# Patient Record
Sex: Female | Born: 1950 | Race: White | Hispanic: No | Smoking: Former smoker
Health system: Southern US, Community
[De-identification: ages and names within clinical notes are randomized; demographics above are authoritative.]

## PROBLEM LIST (undated history)

## (undated) ENCOUNTER — Inpatient Hospital Stay (AMBULATORY_SURGERY_CENTER): Payer: PPO | Admitting: Podiatry

## (undated) DIAGNOSIS — Z8619 Personal history of other infectious and parasitic diseases: Secondary | ICD-10-CM

## (undated) DIAGNOSIS — Z8489 Family history of other specified conditions: Secondary | ICD-10-CM

## (undated) DIAGNOSIS — T8859XA Other complications of anesthesia, initial encounter: Secondary | ICD-10-CM

## (undated) DIAGNOSIS — M353 Polymyalgia rheumatica: Secondary | ICD-10-CM

## (undated) DIAGNOSIS — M810 Age-related osteoporosis without current pathological fracture: Secondary | ICD-10-CM

## (undated) DIAGNOSIS — E1142 Type 2 diabetes mellitus with diabetic polyneuropathy: Secondary | ICD-10-CM

## (undated) DIAGNOSIS — E781 Pure hyperglyceridemia: Secondary | ICD-10-CM

## (undated) DIAGNOSIS — E040 Nontoxic diffuse goiter: Secondary | ICD-10-CM

## (undated) DIAGNOSIS — R3915 Urgency of urination: Secondary | ICD-10-CM

## (undated) DIAGNOSIS — M1990 Unspecified osteoarthritis, unspecified site: Secondary | ICD-10-CM

## (undated) DIAGNOSIS — F419 Anxiety disorder, unspecified: Secondary | ICD-10-CM

## (undated) DIAGNOSIS — Z8709 Personal history of other diseases of the respiratory system: Secondary | ICD-10-CM

## (undated) DIAGNOSIS — T4145XA Adverse effect of unspecified anesthetic, initial encounter: Secondary | ICD-10-CM

## (undated) DIAGNOSIS — F329 Major depressive disorder, single episode, unspecified: Secondary | ICD-10-CM

## (undated) DIAGNOSIS — D649 Anemia, unspecified: Secondary | ICD-10-CM

## (undated) DIAGNOSIS — E785 Hyperlipidemia, unspecified: Secondary | ICD-10-CM

## (undated) DIAGNOSIS — R112 Nausea with vomiting, unspecified: Secondary | ICD-10-CM

## (undated) DIAGNOSIS — M2550 Pain in unspecified joint: Secondary | ICD-10-CM

## (undated) DIAGNOSIS — F32A Depression, unspecified: Secondary | ICD-10-CM

## (undated) DIAGNOSIS — M797 Fibromyalgia: Secondary | ICD-10-CM

## (undated) DIAGNOSIS — E119 Type 2 diabetes mellitus without complications: Secondary | ICD-10-CM

## (undated) DIAGNOSIS — E041 Nontoxic single thyroid nodule: Secondary | ICD-10-CM

## (undated) DIAGNOSIS — F418 Other specified anxiety disorders: Secondary | ICD-10-CM

## (undated) DIAGNOSIS — Z9889 Other specified postprocedural states: Secondary | ICD-10-CM

## (undated) DIAGNOSIS — G629 Polyneuropathy, unspecified: Secondary | ICD-10-CM

## (undated) DIAGNOSIS — G4700 Insomnia, unspecified: Secondary | ICD-10-CM

## (undated) DIAGNOSIS — J40 Bronchitis, not specified as acute or chronic: Secondary | ICD-10-CM

## (undated) DIAGNOSIS — J189 Pneumonia, unspecified organism: Secondary | ICD-10-CM

## (undated) HISTORY — PX: ESOPHAGOGASTRODUODENOSCOPY: SHX1529

## (undated) HISTORY — PX: JOINT REPLACEMENT: SHX530

## (undated) HISTORY — PX: CHOLECYSTECTOMY: SHX55

## (undated) HISTORY — PX: HIP ARTHROPLASTY: SHX981

## (undated) HISTORY — DX: Anemia, unspecified: D64.9

## (undated) HISTORY — DX: Pure hyperglyceridemia: E78.1

## (undated) HISTORY — DX: Anxiety disorder, unspecified: F41.9

## (undated) HISTORY — DX: Age-related osteoporosis without current pathological fracture: M81.0

## (undated) HISTORY — PX: TUMOR REMOVAL: SHX12

## (undated) HISTORY — DX: Polymyalgia rheumatica: M35.3

## (undated) HISTORY — PX: CERVICAL FUSION: SHX112

## (undated) HISTORY — DX: Nontoxic diffuse goiter: E04.0

## (undated) HISTORY — DX: Other specified anxiety disorders: F41.8

## (undated) HISTORY — PX: TUBAL LIGATION: SHX77

## (undated) HISTORY — PX: CARPAL TUNNEL RELEASE: SHX101

## (undated) HISTORY — PX: COLONOSCOPY: SHX174

## (undated) HISTORY — DX: Type 2 diabetes mellitus with diabetic polyneuropathy: E11.42

## (undated) HISTORY — PX: ULNAR NERVE REPAIR: SHX2594

## (undated) HISTORY — PX: BACK SURGERY: SHX140

---

## 1983-07-10 HISTORY — PX: CARPAL TUNNEL RELEASE: SHX101

## 1985-07-09 HISTORY — PX: TUBAL LIGATION: SHX77

## 1987-07-10 HISTORY — PX: BACK SURGERY: SHX140

## 1994-07-09 HISTORY — PX: OTHER SURGICAL HISTORY: SHX169

## 2000-01-03 ENCOUNTER — Other Ambulatory Visit: Admission: RE | Admit: 2000-01-03 | Discharge: 2000-01-03 | Payer: Self-pay | Admitting: Obstetrics & Gynecology

## 2000-01-11 ENCOUNTER — Other Ambulatory Visit: Admission: RE | Admit: 2000-01-11 | Discharge: 2000-01-11 | Payer: Self-pay | Admitting: *Deleted

## 2000-01-11 ENCOUNTER — Encounter (INDEPENDENT_AMBULATORY_CARE_PROVIDER_SITE_OTHER): Payer: Self-pay

## 2001-02-17 ENCOUNTER — Other Ambulatory Visit: Admission: RE | Admit: 2001-02-17 | Discharge: 2001-02-17 | Payer: Self-pay | Admitting: Obstetrics and Gynecology

## 2002-04-24 ENCOUNTER — Other Ambulatory Visit: Admission: RE | Admit: 2002-04-24 | Discharge: 2002-04-24 | Payer: Self-pay | Admitting: Obstetrics and Gynecology

## 2003-04-30 ENCOUNTER — Other Ambulatory Visit: Admission: RE | Admit: 2003-04-30 | Discharge: 2003-04-30 | Payer: Self-pay | Admitting: Obstetrics and Gynecology

## 2005-07-09 HISTORY — PX: CHOLECYSTECTOMY: SHX55

## 2005-07-09 HISTORY — PX: EYE SURGERY: SHX253

## 2006-07-09 HISTORY — PX: REFRACTIVE SURGERY: SHX103

## 2006-12-23 ENCOUNTER — Encounter (HOSPITAL_COMMUNITY): Admission: RE | Admit: 2006-12-23 | Discharge: 2007-01-03 | Payer: Self-pay | Admitting: Endocrinology

## 2007-01-03 ENCOUNTER — Inpatient Hospital Stay (HOSPITAL_COMMUNITY): Admission: RE | Admit: 2007-01-03 | Discharge: 2007-01-05 | Payer: Self-pay | Admitting: Neurosurgery

## 2007-07-10 HISTORY — PX: CERVICAL FUSION: SHX112

## 2009-07-09 HISTORY — PX: OTHER SURGICAL HISTORY: SHX169

## 2010-11-21 NOTE — Op Note (Signed)
Madison, Stein                ACCOUNT NO.:  [id number]   MEDICAL RECORD NO.:  [id number]          PATIENT TYPE:  INP   LOCATION:  2899                         FACILITY:  [facility]   PHYSICIAN:  Danae Orleans. Venetia Maxon, M.D.  DATE OF BIRTH:  19-Nov-1950   DATE OF PROCEDURE:  01/03/2007  DATE OF DISCHARGE:                               OPERATIVE REPORT   PREOPERATIVE DIAGNOSIS:  Herniated cervical disc with stenosis,  spondylosis, degenerative disc disease, radiculopathy with morbid  obesity, C4-C5, C5-C6, and C6-C7.   POSTOPERATIVE DIAGNOSIS:  Herniated cervical disc with stenosis,  spondylosis, degenerative disc disease, radiculopathy with morbid  obesity, C4-C5, C5-C6, and C6-C7.   PROCEDURE:  Anterior cervical decompression and fusion C4-C5, C5-C6, and  C6-C7, with PEEK interbody cages, morcellized autograft bone and  Osteocel, and anterior cervical plate.   SURGEON:  Danae Orleans. Venetia Maxon, M.D.   ASSISTANT:  Cristi Loron, M.D.  Georgiann Cocker, RN   ANESTHESIA:  General endotracheal anesthesia.   ESTIMATED BLOOD LOSS:  450 mL.   COMPLICATIONS:  None.   DISPOSITION:  To recovery.   INDICATIONS FOR PROCEDURE:  Madison Stein is a 60 year old woman with  severe cervical spondylosis and foraminal stenosis, left greater than  right, at C5-C6 and C6-C7 levels with a large disc herniation at C4-C5,  and nerve root compression.  It was elected to take her to surgery for  anterior cervical decompression and fusion.   DESCRIPTION OF PROCEDURE:  Madison Stein was brought to the operating room.  Following satisfactory and uncomplicated induction of general  endotracheal anesthesia and placement of intravenous lines, the patient  was placed in the supine position on the operating table.  Her neck was  placed in slight extension.  She was placed in 10 pounds of halter  traction.  Her anterior neck was then prepped and draped in the usual  sterile fashion.  Shoulders were wrapped due to the  patient's large body  habitus for later visualization with x-ray.  Subsequently, after  infiltrating the skin and subcutaneous tissues with 0.25% Marcaine, 0.5%  lidocaine, and 1:200,000 epinephrine, an incision was made from midline  to the anterior border of the sternocleidomastoid muscle and carried  sharply through the platysmal layer.  Subplatysmal dissection was  performed exposing the anterior border of the sternocleidomastoid  muscle. Using blunt dissection, the carotid sheath kept lateral and  trachea and esophagus kept medial, exposing the anterior cervical spine.  A bent spinal needle was placed at what was felt to be the C4-C5 level  and this was confirmed on intraoperative x-ray.   Subsequently, longus colli muscles were taken down from the anterior  cervical spine from C4 through C7 using electrocautery and Key elevator.  Large ventral osteophytes were removed. Initially the C5-C6 and C6-C7  levels were operated and the self-retaining retractor was placed along  with up and down retractor.  The interspaces at C5-C6 and C6-C7 were  highly degenerated and osteophytes were removed.  The interspaces were  incised and disc material was removed in a piecemeal fashion. Initially  at C6-C7 level, distraction pins were placed  at C6 and C7.  The disc  space was highly degenerated.  Endplates were drilled down with a high  speed drill and the bone removed was saved for later use as bone  grafting.  Eventually, the large uncinate spurs were drilled down and  highly degenerated ligamentous tissue was removed with resultant  decompression of the thecal sac and both C7 nerve roots, in particularly  the left C7 nerve root had an extremely vertical take off and this was  decompressed.  Hemostasis was assured with Gelfoam soaked thrombin.  After trial sizing, a 6 mm medium PEEK interbody cage was selected,  packed with morselized bone autograft and Osteocel, inserted in the  interspace, and  countersunk appropriately.   Attention was then turned to the C5-C6 level and distraction pins were  moved at C5 and C6 and, at this level, there appeared again a very  degenerated disc level and the endplates were decorticated, uncinate  spurs were drilled down.  On the left side, there was extremely severe  degenerative changes with a large osteophyte and eventually, upon  removing this osteophyte, the C6 nerve root, which also had an extremely  vertical take off, was decompressed.  Hemostasis was again assured with  Gelfoam soaked in thrombin.  After trial sizing, a 6 mm PEEK interbody  cage was selected, packed with morselized autograft and Osteocel,  inserted in the interspace, and countersunk appropriately.   Attention was then turned to the C4-C5 level which was not nearly as  degenerated.  A fair amount of soft disc material was removed.  The  endplates were again decorticated with the high speed drill.  There was  a large amount of central and leftward herniated disc material causing  significant cord compression, particularly on the left lateral aspect of  the spinal cord and C5 nerve root. Both C5 nerve roots were decompressed  as they extended out the neural foramina.  Hemostasis was again assured.  The central spinal cord dura appeared to be well decompressed and  similarly sized medium PEEK interbody cage was selected, packed  morselized bone autograft and Osteocel, inserted in the interspace, and  countersunk appropriately.   The 49 mm Trestle anterior cervical plate was then affixed to the  anterior cervical spine using variable angle 14 mm screws.  The traction  weight was removed prior to placement of the plate.  14 mm variable  angle screws were placed at C4, C5, C6 and C7. All screws had excellent  purchase and the locking mechanisms were engaged.  Final x-ray  demonstrated the superior aspect of the plate at the C4 level but no  additional visualization despite  optimized x-ray technique.  The wound  was then copiously irrigated with bacitracin saline.  Soft tissues were  inspected and found to be in good repair.  A #7 JP Blake drain was then  inserted through a separate stab incision and anchored with an 0 nylon  stitch.  The platysma layer was closed with 3-0 Vicryl sutures and skin  edges were approximated with 3-0 Vicryl interrupted inverted sutures.  The wound was dressed with Benzoin, Steri-Strips, Telfa gauze and tape.  The patient was extubated in the operating room and returned to the  recovery room in stable satisfactory condition, having tolerated the  operation well.  All counts were correct at the end of the case.      Danae Orleans. Venetia Maxon, M.D.  Electronically Signed     JDS/MEDQ  D:  01/03/2007  T:  01/04/2007  Job:  [id number]

## 2011-04-25 LAB — CBC
HCT: 41.3
MCV: 81.4
Platelets: 307
RDW: 13.9

## 2011-07-10 DIAGNOSIS — J40 Bronchitis, not specified as acute or chronic: Secondary | ICD-10-CM

## 2011-07-10 HISTORY — DX: Bronchitis, not specified as acute or chronic: J40

## 2013-11-09 ENCOUNTER — Other Ambulatory Visit: Payer: Self-pay | Admitting: Orthopedic Surgery

## 2013-11-12 ENCOUNTER — Encounter (HOSPITAL_COMMUNITY): Payer: Self-pay | Admitting: Pharmacy Technician

## 2013-11-14 NOTE — Pre-Procedure Instructions (Signed)
[facility] - Preparing for Surgery  Before surgery, you can play an important role.  Because skin is not sterile, your skin needs to be as free of germs as possible.  You can reduce the number of germs on you skin by washing with CHG (chlorahexidine gluconate) soap before surgery.  CHG is an antiseptic cleaner which kills germs and bonds with the skin to continue killing germs even after washing.  Please DO NOT use if you have an allergy to CHG or antibacterial soaps.  If your skin becomes reddened/irritated stop using the CHG and inform your nurse when you arrive at Short Stay.  Do not shave (including legs and underarms) for at least 48 hours prior to the first CHG shower.  You may shave your face.  Please follow these instructions carefully:   1.  Shower with CHG Soap the night before surgery and the morning of Surgery.  2.  If you choose to wash your hair, wash your hair first as usual with your normal shampoo.  3.  After you shampoo, rinse your hair and body thoroughly to remove the shampoo.  4.  Use CHG as you would any other liquid soap.  You can apply CHG directly to the skin and wash gently with scrungie or a clean washcloth.  5.  Apply the CHG Soap to your body ONLY FROM THE NECK DOWN.  Do not use on open wounds or open sores.  Avoid contact with your eyes, ears, mouth and genitals (private parts).  Wash genitals (private parts) with your normal soap.  6.  Wash thoroughly, paying special attention to the area where your surgery will be performed.  7.  Thoroughly rinse your body with warm water from the neck down.  8.  DO NOT shower/wash with your normal soap after using and rinsing off the CHG Soap.  9.  Pat yourself dry with a clean towel.            10.  Wear clean pajamas.            11.  Place clean sheets on your bed the night of your first shower and do not sleep with pets.  Day of Surgery  Do not apply any lotions the morning of surgery.  Please wear clean clothes to the  hospital/surgery center.   

## 2013-11-14 NOTE — Pre-Procedure Instructions (Signed)
Madison Stein  11/14/2013   Your procedure is scheduled on:  May 15  Report to [facility] Admitting at 10:50 AM.  Call this number if you have problems the morning of surgery: [phone]   Remember:   Do not eat food or drink liquids after midnight.   Take these medicines the morning of surgery with A SIP OF WATER: Cymbalta   STOP Arthrotec and multiple vitamins today   STOP/ Do not take Aspirin, Aleve, Naproxen, Advil, Ibuprofen, Vitamin, Herbs, or Supplements starting today   Do not wear jewelry, make-up or nail polish.  Do not wear lotions, powders, or perfumes. You may wear deodorant.  Do not shave 48 hours prior to surgery. Men may shave face and neck.  Do not bring valuables to the hospital.  [facility] is not responsible for any belongings or valuables.               Contacts, dentures or bridgework may not be worn into surgery.  Leave suitcase in the car. After surgery it may be brought to your room.  For patients admitted to the hospital, discharge time is determined by your treatment team.               Special Instructions: See [facility] Preparing For Surgery   Please read over the following fact sheets that you were given: Pain Booklet, Coughing and Deep Breathing, Blood Transfusion Information, Total Joint Packet and Surgical Site Infection Prevention

## 2013-11-16 ENCOUNTER — Encounter (HOSPITAL_COMMUNITY)
Admission: RE | Admit: 2013-11-16 | Discharge: 2013-11-16 | Disposition: A | Discharge status: Home / Self Care | Payer: Managed Care, Other (non HMO) | Source: Ambulatory Visit | Attending: Orthopedic Surgery | Admitting: Orthopedic Surgery

## 2013-11-16 ENCOUNTER — Encounter (HOSPITAL_COMMUNITY): Payer: Self-pay

## 2013-11-16 DIAGNOSIS — Z01810 Encounter for preprocedural cardiovascular examination: Secondary | ICD-10-CM | POA: Insufficient documentation

## 2013-11-16 DIAGNOSIS — Z01818 Encounter for other preprocedural examination: Secondary | ICD-10-CM | POA: Insufficient documentation

## 2013-11-16 DIAGNOSIS — Z01812 Encounter for preprocedural laboratory examination: Secondary | ICD-10-CM | POA: Insufficient documentation

## 2013-11-16 DIAGNOSIS — E119 Type 2 diabetes mellitus without complications: Secondary | ICD-10-CM | POA: Insufficient documentation

## 2013-11-16 HISTORY — DX: Pain in unspecified joint: M25.50

## 2013-11-16 HISTORY — DX: Personal history of other diseases of the respiratory system: Z87.09

## 2013-11-16 HISTORY — DX: Type 2 diabetes mellitus without complications: E11.9

## 2013-11-16 HISTORY — DX: Personal history of other infectious and parasitic diseases: Z86.19

## 2013-11-16 HISTORY — DX: Hyperlipidemia, unspecified: E78.5

## 2013-11-16 HISTORY — DX: Unspecified osteoarthritis, unspecified site: M19.90

## 2013-11-16 HISTORY — DX: Pneumonia, unspecified organism: J18.9

## 2013-11-16 HISTORY — DX: Insomnia, unspecified: G47.00

## 2013-11-16 HISTORY — DX: Family history of other specified conditions: Z84.89

## 2013-11-16 HISTORY — DX: Fibromyalgia: M79.7

## 2013-11-16 HISTORY — DX: Nontoxic single thyroid nodule: E04.1

## 2013-11-16 HISTORY — DX: Major depressive disorder, single episode, unspecified: F32.9

## 2013-11-16 HISTORY — DX: Depression, unspecified: F32.A

## 2013-11-16 LAB — URINALYSIS, ROUTINE W REFLEX MICROSCOPIC
Glucose, UA: NEGATIVE mg/dL
HGB URINE DIPSTICK: NEGATIVE
KETONES UR: 15 mg/dL — AB
Nitrite: POSITIVE — AB
PH: 5 (ref 5.0–8.0)
Protein, ur: NEGATIVE mg/dL
Specific Gravity, Urine: 1.028 (ref 1.005–1.030)
Urobilinogen, UA: 1 mg/dL (ref 0.0–1.0)

## 2013-11-16 LAB — BASIC METABOLIC PANEL
BUN: 21 mg/dL (ref 6–23)
CALCIUM: 9.4 mg/dL (ref 8.4–10.5)
CO2: 22 meq/L (ref 19–32)
CREATININE: 0.83 mg/dL (ref 0.50–1.10)
Chloride: 102 mEq/L (ref 96–112)
GFR calc Af Amer: 87 mL/min — ABNORMAL LOW (ref >90)
GFR, EST NON AFRICAN AMERICAN: 75 mL/min — AB (ref >90)
Glucose, Bld: 195 mg/dL — ABNORMAL HIGH (ref 70–99)
Potassium: 4.8 mEq/L (ref 3.7–5.3)
SODIUM: 141 meq/L (ref 137–147)

## 2013-11-16 LAB — URINE MICROSCOPIC-ADD ON

## 2013-11-16 LAB — PROTIME-INR
INR: 0.9 (ref 0.00–1.49)
PROTHROMBIN TIME: 12 s (ref 11.6–15.2)

## 2013-11-16 LAB — TYPE AND SCREEN
ABO/RH(D): O POS
ANTIBODY SCREEN: NEGATIVE

## 2013-11-16 LAB — CBC WITH DIFFERENTIAL/PLATELET
BASOS ABS: 0.1 10*3/uL (ref 0.0–0.1)
Basophils Relative: 1 % (ref 0–1)
EOS PCT: 4 % (ref 0–5)
Eosinophils Absolute: 0.5 10*3/uL (ref 0.0–0.7)
HCT: 41.7 % (ref 36.0–46.0)
Hemoglobin: 13.4 g/dL (ref 12.0–15.0)
LYMPHS PCT: 32 % (ref 12–46)
Lymphs Abs: 3.3 10*3/uL (ref 0.7–4.0)
MCH: 27.3 pg (ref 26.0–34.0)
MCHC: 32.1 g/dL (ref 30.0–36.0)
MCV: 84.9 fL (ref 78.0–100.0)
Monocytes Absolute: 0.6 10*3/uL (ref 0.1–1.0)
Monocytes Relative: 6 % (ref 3–12)
NEUTROS PCT: 57 % (ref 43–77)
Neutro Abs: 6 10*3/uL (ref 1.7–7.7)
PLATELETS: 350 10*3/uL (ref 150–400)
RBC: 4.91 MIL/uL (ref 3.87–5.11)
RDW: 13.4 % (ref 11.5–15.5)
WBC: 10.5 10*3/uL (ref 4.0–10.5)

## 2013-11-16 LAB — SURGICAL PCR SCREEN
MRSA, PCR: NEGATIVE
Staphylococcus aureus: POSITIVE — AB

## 2013-11-16 LAB — APTT: aPTT: 31 seconds (ref 24–37)

## 2013-11-16 LAB — ABO/RH: ABO/RH(D): O POS

## 2013-11-16 MED ORDER — CHLORHEXIDINE GLUCONATE 4 % EX LIQD: 60.0000 mL | Freq: Once | CUTANEOUS | Status: DC

## 2013-11-16 NOTE — Progress Notes (Signed)
Prescription called into [facility] per patient request

## 2013-11-16 NOTE — Pre-Procedure Instructions (Signed)
Madison Stein  11/16/2013   Your procedure is scheduled on:  Fri, May 15 @ 12:50 PM  Report to [facility]  at 10:45 AM.  Call this number if you have problems the morning of surgery: [phone]   Remember:   Do not eat food or drink liquids after midnight.   Take these medicines the morning of surgery with A SIP OF WATER: Cymbalta(Duloxetine)               Stop taking your Diclofenac. No Goody's,BC's,Aleve,Aspirin,Ibuprofen,Fish Oil,or any Herbal Medicaitons   Do not wear jewelry, make-up or nail polish.  Do not wear lotions, powders, or perfumes. You may wear deodorant.  Do not shave 48 hours prior to surgery.   Do not bring valuables to the hospital.  [facility] is not responsible                  for any belongings or valuables.               Contacts, dentures or bridgework may not be worn into surgery.  Leave suitcase in the car. After surgery it may be brought to your room.  For patients admitted to the hospital, discharge time is determined by your                treatment team.                Special Instructions:  [facility] - Preparing for Surgery  Before surgery, you can play an important role.  Because skin is not sterile, your skin needs to be as free of germs as possible.  You can reduce the number of germs on you skin by washing with CHG (chlorahexidine gluconate) soap before surgery.  CHG is an antiseptic cleaner which kills germs and bonds with the skin to continue killing germs even after washing.  Please DO NOT use if you have an allergy to CHG or antibacterial soaps.  If your skin becomes reddened/irritated stop using the CHG and inform your nurse when you arrive at Short Stay.  Do not shave (including legs and underarms) for at least 48 hours prior to the first CHG shower.  You may shave your face.  Please follow these instructions carefully:   1.  Shower with CHG Soap the night before surgery and the                                morning of  Surgery.  2.  If you choose to wash your hair, wash your hair first as usual with your       normal shampoo.  3.  After you shampoo, rinse your hair and body thoroughly to remove the                      Shampoo.  4.  Use CHG as you would any other liquid soap.  You can apply chg directly       to the skin and wash gently with scrungie or a clean washcloth.  5.  Apply the CHG Soap to your body ONLY FROM THE NECK DOWN.        Do not use on open wounds or open sores.  Avoid contact with your eyes,       ears, mouth and genitals (private parts).  Wash genitals (private parts)       with your normal  soap.  6.  Wash thoroughly, paying special attention to the area where your surgery        will be performed.  7.  Thoroughly rinse your body with warm water from the neck down.  8.  DO NOT shower/wash with your normal soap after using and rinsing off       the CHG Soap.  9.  Pat yourself dry with a clean towel.            10.  Wear clean pajamas.            11.  Place clean sheets on your bed the night of your first shower and do not        sleep with pets.  Day of Surgery  Do not apply any lotions/deoderants the morning of surgery.  Please wear clean clothes to the hospital/surgery center.     Please read over the following fact sheets that you were given: Pain Booklet, Coughing and Deep Breathing, Blood Transfusion Information, MRSA Information and Surgical Site Infection Prevention

## 2013-11-16 NOTE — Progress Notes (Signed)
Pt doesn't have a cardiologist  Denies ever having an echo/stress test/heart cath  Denies EKG or CXR in past yr  Medical Md is [place] in [place]

## 2013-11-19 MED ORDER — CEFAZOLIN SODIUM-DEXTROSE 2-3 GM-% IV SOLR
2.0000 g | INTRAVENOUS | Status: AC
  Administered 2013-11-20: 2 g via INTRAVENOUS
  Filled 2013-11-19: qty 50

## 2013-11-19 NOTE — H&P (Signed)
TOTAL HIP ADMISSION H&P  Patient is admitted for right total hip arthroplasty.  Subjective:  Chief Complaint: right hip pain  HPI: Madison Stein, 63 y.o. female, has a history of pain and functional disability in the right hip(s) due to arthritis and patient has failed non-surgical conservative treatments for greater than 12 weeks to include NSAID's and/or analgesics, corticosteriod injections, weight reduction as appropriate and activity modification.  Onset of symptoms was gradual starting several years ago with gradually worsening course since that time.The patient noted no past surgery on the right hip(s).  Patient currently rates pain in the right hip at 10 out of 10 with activity. Patient has night pain, worsening of pain with activity and weight bearing, pain that interfers with activities of daily living and pain with passive range of motion. Patient has evidence of joint space narrowing by imaging studies. This condition presents safety issues increasing the risk of falls.  There is no current active infection.  There are no active problems to display for this patient.  Past Medical History  Diagnosis Date  . Diabetes mellitus without complication     takes Metformin daily  . Depression     takes Cymbalta daily  . Family history of anesthesia complication     mom and sister get very sick  . Hyperlipidemia     not on meds right now;watching what she eats  . Pneumonia 35yrs ago    hx of  . History of bronchitis 43yrs ago  . Fibromyalgia   . Arthritis   . Joint pain   . Thyroid nodule   . Insomnia     has Ambien if needed  . History of shingles     Past Surgical History  Procedure Laterality Date  . Carpal tunnel release Right 1985  . Back surgery  1989  . Ulnar nerve release Left 1996  . Cholecystectomy  2007  . Refractive surgery  2008  . Cervical fusion  2009  . Colonoscopy    . Esophagogastroduodenoscopy    . 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 Morphine And Related Nausea And Vomiting    History  Substance Use Topics  . Smoking status: Former Research scientist (life sciences)  . Smokeless tobacco: Not on file     Comment: quit smoking in 1997  . Alcohol Use: Yes     Comment: rare beer    No family history on file.   Review of Systems  Constitutional: Negative.   HENT: Negative.   Eyes: Negative.   Respiratory: Negative.   Cardiovascular: Negative.   Gastrointestinal: Negative.   Genitourinary: Negative.   Musculoskeletal: Positive for joint pain and myalgias.  Skin: Negative.   Neurological: Negative.   Endo/Heme/Allergies: Negative.   Psychiatric/Behavioral: Negative.     Objective:  Physical Exam  Constitutional: She is oriented to person, place, and time. She appears well-developed and well-nourished.  HENT:  Head: Normocephalic and atraumatic.  Eyes: Pupils are equal, round, and reactive to light.  Neck: Normal range of motion. Neck supple.  Cardiovascular: Intact distal pulses.   Respiratory: Effort normal.  Musculoskeletal: She exhibits tenderness.   Internal rotation causes severe pain. She walks with a Trendelenburg gait on the right side, secondary to pain and muscle weakness.    Neurological: She is alert and oriented to person, place, and time.  Skin: Skin is warm and dry.  Psychiatric: She has a normal mood  and affect. Her behavior is normal. Judgment and thought content normal.    Vital signs in last 24 hours:    Labs:   There is no height or weight on file to calculate BMI.   Imaging Review Radiographs:  X-rays were ordered, performed, and interpreted by me today included; AP pelvis, crosstable lateral of the right hip show loss of one to 2 mm of cartilage superiorly.  She has a high neck shaft angle of about 140, consistent with mild developmental hip dysplasia.  This is not present on the left hip.  Assessment/Plan:  End stage arthritis,  right hip(s)  The patient history, physical examination, clinical judgement of the provider and imaging studies are consistent with end stage degenerative joint disease of the right hip(s) and total hip arthroplasty is deemed medically necessary. The treatment options including medical management, injection therapy, arthroscopy and arthroplasty were discussed at length. The risks and benefits of total hip arthroplasty were presented and reviewed. The risks due to aseptic loosening, infection, stiffness, dislocation/subluxation,  thromboembolic complications and other imponderables were discussed.  The patient acknowledged the explanation, agreed to proceed with the plan and consent was signed. Patient is being admitted for inpatient treatment for surgery, pain control, PT, OT, prophylactic antibiotics, VTE prophylaxis, progressive ambulation and ADL's and discharge planning.The patient is planning to be discharged to skilled nursing facility

## 2013-11-20 ENCOUNTER — Inpatient Hospital Stay (HOSPITAL_COMMUNITY): Payer: Managed Care, Other (non HMO)

## 2013-11-20 ENCOUNTER — Encounter (HOSPITAL_COMMUNITY): Payer: Self-pay

## 2013-11-20 ENCOUNTER — Encounter (HOSPITAL_COMMUNITY)
Admission: RE | Disposition: A | Discharge status: Home Health | Payer: Self-pay | Source: Ambulatory Visit | Attending: Orthopedic Surgery

## 2013-11-20 ENCOUNTER — Inpatient Hospital Stay (HOSPITAL_COMMUNITY)
Admission: RE | Admit: 2013-11-20 | Discharge: 2013-11-22 | DRG: 470 | Disposition: A | Discharge status: Home Health | Payer: Managed Care, Other (non HMO) | Source: Ambulatory Visit | Attending: Orthopedic Surgery | Admitting: Orthopedic Surgery

## 2013-11-20 ENCOUNTER — Encounter (HOSPITAL_COMMUNITY): Payer: Managed Care, Other (non HMO) | Admitting: Anesthesiology

## 2013-11-20 ENCOUNTER — Inpatient Hospital Stay (HOSPITAL_COMMUNITY): Payer: Managed Care, Other (non HMO) | Admitting: Anesthesiology

## 2013-11-20 DIAGNOSIS — F3289 Other specified depressive episodes: Secondary | ICD-10-CM | POA: Diagnosis present

## 2013-11-20 DIAGNOSIS — Z885 Allergy status to narcotic agent status: Secondary | ICD-10-CM

## 2013-11-20 DIAGNOSIS — Z87891 Personal history of nicotine dependence: Secondary | ICD-10-CM

## 2013-11-20 DIAGNOSIS — Z7982 Long term (current) use of aspirin: Secondary | ICD-10-CM

## 2013-11-20 DIAGNOSIS — M1610 Unilateral primary osteoarthritis, unspecified hip: Principal | ICD-10-CM | POA: Diagnosis present

## 2013-11-20 DIAGNOSIS — F329 Major depressive disorder, single episode, unspecified: Secondary | ICD-10-CM | POA: Diagnosis present

## 2013-11-20 DIAGNOSIS — E785 Hyperlipidemia, unspecified: Secondary | ICD-10-CM | POA: Diagnosis present

## 2013-11-20 DIAGNOSIS — E119 Type 2 diabetes mellitus without complications: Secondary | ICD-10-CM | POA: Diagnosis present

## 2013-11-20 DIAGNOSIS — Z8619 Personal history of other infectious and parasitic diseases: Secondary | ICD-10-CM

## 2013-11-20 DIAGNOSIS — M169 Osteoarthritis of hip, unspecified: Principal | ICD-10-CM | POA: Diagnosis present

## 2013-11-20 DIAGNOSIS — Z9089 Acquired absence of other organs: Secondary | ICD-10-CM

## 2013-11-20 DIAGNOSIS — G4700 Insomnia, unspecified: Secondary | ICD-10-CM | POA: Diagnosis present

## 2013-11-20 DIAGNOSIS — Z79899 Other long term (current) drug therapy: Secondary | ICD-10-CM

## 2013-11-20 DIAGNOSIS — IMO0001 Reserved for inherently not codable concepts without codable children: Secondary | ICD-10-CM | POA: Diagnosis present

## 2013-11-20 DIAGNOSIS — Z981 Arthrodesis status: Secondary | ICD-10-CM

## 2013-11-20 DIAGNOSIS — M1611 Unilateral primary osteoarthritis, right hip: Secondary | ICD-10-CM | POA: Diagnosis present

## 2013-11-20 HISTORY — DX: Other specified postprocedural states: R11.2

## 2013-11-20 HISTORY — DX: Other specified postprocedural states: Z98.890

## 2013-11-20 HISTORY — DX: Other complications of anesthesia, initial encounter: T88.59XA

## 2013-11-20 HISTORY — PX: TOTAL HIP ARTHROPLASTY: SHX124

## 2013-11-20 HISTORY — DX: Adverse effect of unspecified anesthetic, initial encounter: T41.45XA

## 2013-11-20 LAB — GLUCOSE, CAPILLARY
GLUCOSE-CAPILLARY: 162 mg/dL — AB (ref 70–99)
Glucose-Capillary: 158 mg/dL — ABNORMAL HIGH (ref 70–99)
Glucose-Capillary: 138 mg/dL — ABNORMAL HIGH (ref 70–99)
Glucose-Capillary: 223 mg/dL — ABNORMAL HIGH (ref 70–99)

## 2013-11-20 SURGERY — ARTHROPLASTY, HIP, TOTAL,POSTERIOR APPROACH
Anesthesia: General | Site: Hip | Laterality: Right

## 2013-11-20 MED ORDER — LACTATED RINGERS IV SOLN
INTRAVENOUS | Status: DC
  Administered 2013-11-20 (×2): via INTRAVENOUS

## 2013-11-20 MED ORDER — TRANEXAMIC ACID 100 MG/ML IV SOLN
1000.0000 mg | INTRAVENOUS | Status: AC
  Administered 2013-11-20: 1000 mg via INTRAVENOUS
  Filled 2013-11-20: qty 10

## 2013-11-20 MED ORDER — EPHEDRINE SULFATE 50 MG/ML IJ SOLN
INTRAMUSCULAR | Status: DC | PRN
  Administered 2013-11-20 (×2): 10 mg via INTRAVENOUS

## 2013-11-20 MED ORDER — PHENYLEPHRINE 40 MCG/ML (10ML) SYRINGE FOR IV PUSH (FOR BLOOD PRESSURE SUPPORT)
PREFILLED_SYRINGE | INTRAVENOUS | Status: AC
  Filled 2013-11-20: qty 10

## 2013-11-20 MED ORDER — PHENYLEPHRINE HCL 10 MG/ML IJ SOLN
INTRAMUSCULAR | Status: DC | PRN
  Administered 2013-11-20: 40 ug via INTRAVENOUS
  Administered 2013-11-20: 80 ug via INTRAVENOUS
  Administered 2013-11-20: 40 ug via INTRAVENOUS

## 2013-11-20 MED ORDER — ALUMINUM HYDROXIDE GEL 320 MG/5ML PO SUSP
15.0000 mL | ORAL | Status: DC | PRN
  Filled 2013-11-20: qty 30

## 2013-11-20 MED ORDER — METFORMIN HCL 500 MG PO TABS
1000.0000 mg | ORAL_TABLET | Freq: Two times a day (BID) | ORAL | Status: DC
  Administered 2013-11-21 – 2013-11-22 (×3): 1000 mg via ORAL
  Filled 2013-11-20 (×5): qty 2

## 2013-11-20 MED ORDER — SODIUM CHLORIDE 0.9 % IR SOLN
Status: DC | PRN
  Administered 2013-11-20: 1000 mL

## 2013-11-20 MED ORDER — ONDANSETRON HCL 4 MG/2ML IJ SOLN: 4.0000 mg | Freq: Once | INTRAMUSCULAR | Status: DC | PRN

## 2013-11-20 MED ORDER — OXYCODONE HCL 5 MG PO TABS
5.0000 mg | ORAL_TABLET | Freq: Once | ORAL | Status: AC | PRN
  Administered 2013-11-20: 5 mg via ORAL

## 2013-11-20 MED ORDER — ASPIRIN EC 325 MG PO TBEC
325.0000 mg | DELAYED_RELEASE_TABLET | Freq: Every day | ORAL | Status: DC
  Administered 2013-11-21 – 2013-11-22 (×2): 325 mg via ORAL
  Filled 2013-11-20 (×3): qty 1

## 2013-11-20 MED ORDER — BUPIVACAINE-EPINEPHRINE (PF) 0.5% -1:200000 IJ SOLN
INTRAMUSCULAR | Status: AC
  Filled 2013-11-20: qty 30

## 2013-11-20 MED ORDER — OXYCODONE HCL 5 MG PO TABS
ORAL_TABLET | ORAL | Status: AC
  Filled 2013-11-20: qty 1

## 2013-11-20 MED ORDER — METHOCARBAMOL 500 MG PO TABS
500.0000 mg | ORAL_TABLET | Freq: Four times a day (QID) | ORAL | Status: DC | PRN
  Administered 2013-11-20 – 2013-11-22 (×6): 500 mg via ORAL
  Filled 2013-11-20 (×7): qty 1

## 2013-11-20 MED ORDER — INSULIN ASPART 100 UNIT/ML ~~LOC~~ SOLN
0.0000 [IU] | Freq: Three times a day (TID) | SUBCUTANEOUS | Status: DC
  Administered 2013-11-21 (×2): 3 [IU] via SUBCUTANEOUS
  Administered 2013-11-21: 5 [IU] via SUBCUTANEOUS
  Administered 2013-11-22 (×2): 3 [IU] via SUBCUTANEOUS

## 2013-11-20 MED ORDER — ALBUMIN HUMAN 5 % IV SOLN
INTRAVENOUS | Status: DC | PRN
  Administered 2013-11-20: 15:00:00 via INTRAVENOUS

## 2013-11-20 MED ORDER — ONDANSETRON HCL 4 MG/2ML IJ SOLN
4.0000 mg | Freq: Four times a day (QID) | INTRAMUSCULAR | Status: DC | PRN
  Administered 2013-11-21: 4 mg via INTRAVENOUS
  Filled 2013-11-20: qty 2

## 2013-11-20 MED ORDER — MIDAZOLAM HCL 5 MG/5ML IJ SOLN
INTRAMUSCULAR | Status: DC | PRN
  Administered 2013-11-20: 2 mg via INTRAVENOUS

## 2013-11-20 MED ORDER — HYDROMORPHONE HCL PF 1 MG/ML IJ SOLN
INTRAMUSCULAR | Status: AC
  Filled 2013-11-20: qty 1

## 2013-11-20 MED ORDER — MENTHOL 3 MG MT LOZG: 1.0000 | LOZENGE | OROMUCOSAL | Status: DC | PRN

## 2013-11-20 MED ORDER — METHOCARBAMOL 1000 MG/10ML IJ SOLN
500.0000 mg | Freq: Four times a day (QID) | INTRAMUSCULAR | Status: DC | PRN
  Administered 2013-11-20: 500 mg via INTRAVENOUS
  Filled 2013-11-20 (×2): qty 5

## 2013-11-20 MED ORDER — FENTANYL CITRATE 0.05 MG/ML IJ SOLN
INTRAMUSCULAR | Status: DC | PRN
  Administered 2013-11-20: 50 ug via INTRAVENOUS
  Administered 2013-11-20: 150 ug via INTRAVENOUS
  Administered 2013-11-20: 100 ug via INTRAVENOUS
  Administered 2013-11-20: 50 ug via INTRAVENOUS
  Administered 2013-11-20: 100 ug via INTRAVENOUS
  Administered 2013-11-20: 50 ug via INTRAVENOUS

## 2013-11-20 MED ORDER — ADULT MULTIVITAMIN W/MINERALS CH
1.0000 | ORAL_TABLET | Freq: Every day | ORAL | Status: DC
Start: 2013-11-20 — End: 2013-11-22
  Administered 2013-11-20 – 2013-11-21 (×2): 1 via ORAL
  Filled 2013-11-20 (×3): qty 1

## 2013-11-20 MED ORDER — PROPOFOL 10 MG/ML IV BOLUS
INTRAVENOUS | Status: DC | PRN
  Administered 2013-11-20: 200 mg via INTRAVENOUS

## 2013-11-20 MED ORDER — HYDROCODONE-ACETAMINOPHEN 5-325 MG PO TABS
1.0000 | ORAL_TABLET | Freq: Four times a day (QID) | ORAL | Status: DC | PRN
  Administered 2013-11-20: 1 via ORAL
  Filled 2013-11-20: qty 1

## 2013-11-20 MED ORDER — ONDANSETRON HCL 4 MG/2ML IJ SOLN
INTRAMUSCULAR | Status: DC | PRN
  Administered 2013-11-20: 4 mg via INTRAVENOUS

## 2013-11-20 MED ORDER — ONDANSETRON HCL 4 MG PO TABS
4.0000 mg | ORAL_TABLET | Freq: Four times a day (QID) | ORAL | Status: DC | PRN
  Administered 2013-11-22: 4 mg via ORAL
  Filled 2013-11-20: qty 1

## 2013-11-20 MED ORDER — ROCURONIUM BROMIDE 100 MG/10ML IV SOLN
INTRAVENOUS | Status: DC | PRN
  Administered 2013-11-20: 50 mg via INTRAVENOUS

## 2013-11-20 MED ORDER — ACETAMINOPHEN 650 MG RE SUPP: 650.0000 mg | Freq: Four times a day (QID) | RECTAL | Status: DC | PRN

## 2013-11-20 MED ORDER — KCL IN DEXTROSE-NACL 20-5-0.45 MEQ/L-%-% IV SOLN
INTRAVENOUS | Status: AC
  Filled 2013-11-20: qty 1000

## 2013-11-20 MED ORDER — BUPIVACAINE-EPINEPHRINE 0.5% -1:200000 IJ SOLN
INTRAMUSCULAR | Status: DC | PRN
  Administered 2013-11-20: 10 mL

## 2013-11-20 MED ORDER — DULOXETINE HCL 60 MG PO CPEP
60.0000 mg | ORAL_CAPSULE | Freq: Two times a day (BID) | ORAL | Status: DC
  Administered 2013-11-20 – 2013-11-22 (×4): 60 mg via ORAL
  Filled 2013-11-20 (×6): qty 1

## 2013-11-20 MED ORDER — OXYCODONE HCL 5 MG/5ML PO SOLN: 5.0000 mg | Freq: Once | ORAL | Status: AC | PRN

## 2013-11-20 MED ORDER — OXYCODONE HCL 5 MG PO TABS
5.0000 mg | ORAL_TABLET | ORAL | Status: DC | PRN
  Administered 2013-11-20 – 2013-11-22 (×11): 10 mg via ORAL
  Filled 2013-11-20 (×11): qty 2

## 2013-11-20 MED ORDER — FENTANYL CITRATE 0.05 MG/ML IJ SOLN
INTRAMUSCULAR | Status: AC
  Filled 2013-11-20: qty 5

## 2013-11-20 MED ORDER — SENNOSIDES-DOCUSATE SODIUM 8.6-50 MG PO TABS: 1.0000 | ORAL_TABLET | Freq: Every evening | ORAL | Status: DC | PRN

## 2013-11-20 MED ORDER — PHENOL 1.4 % MT LIQD: 1.0000 | OROMUCOSAL | Status: DC | PRN

## 2013-11-20 MED ORDER — BISACODYL 5 MG PO TBEC: 5.0000 mg | DELAYED_RELEASE_TABLET | Freq: Every day | ORAL | Status: DC | PRN

## 2013-11-20 MED ORDER — FLEET ENEMA 7-19 GM/118ML RE ENEM: 1.0000 | ENEMA | Freq: Once | RECTAL | Status: AC | PRN

## 2013-11-20 MED ORDER — ACETAMINOPHEN 325 MG PO TABS: 650.0000 mg | ORAL_TABLET | Freq: Four times a day (QID) | ORAL | Status: DC | PRN

## 2013-11-20 MED ORDER — METOCLOPRAMIDE HCL 5 MG/ML IJ SOLN: 5.0000 mg | Freq: Three times a day (TID) | INTRAMUSCULAR | Status: DC | PRN

## 2013-11-20 MED ORDER — GLYCOPYRROLATE 0.2 MG/ML IJ SOLN
INTRAMUSCULAR | Status: DC | PRN
  Administered 2013-11-20: 0.4 mg via INTRAVENOUS

## 2013-11-20 MED ORDER — HYDROMORPHONE HCL PF 1 MG/ML IJ SOLN
0.2500 mg | INTRAMUSCULAR | Status: DC | PRN
  Administered 2013-11-20: 0.25 mg via INTRAVENOUS
  Administered 2013-11-20 (×2): 0.5 mg via INTRAVENOUS
  Administered 2013-11-20: 0.25 mg via INTRAVENOUS

## 2013-11-20 MED ORDER — LIDOCAINE HCL (CARDIAC) 20 MG/ML IV SOLN
INTRAVENOUS | Status: DC | PRN
  Administered 2013-11-20: 100 mg via INTRAVENOUS

## 2013-11-20 MED ORDER — DOCUSATE SODIUM 100 MG PO CAPS
100.0000 mg | ORAL_CAPSULE | Freq: Two times a day (BID) | ORAL | Status: DC
Start: 2013-11-20 — End: 2013-11-22
  Administered 2013-11-20 – 2013-11-22 (×4): 100 mg via ORAL
  Filled 2013-11-20 (×5): qty 1

## 2013-11-20 MED ORDER — NEOSTIGMINE METHYLSULFATE 10 MG/10ML IV SOLN
INTRAVENOUS | Status: DC | PRN
  Administered 2013-11-20: 3 mg via INTRAVENOUS

## 2013-11-20 MED ORDER — HYDROMORPHONE HCL PF 1 MG/ML IJ SOLN
0.5000 mg | INTRAMUSCULAR | Status: DC | PRN
  Administered 2013-11-21 (×2): 1 mg via INTRAVENOUS
  Filled 2013-11-20 (×2): qty 1

## 2013-11-20 MED ORDER — MIDAZOLAM HCL 2 MG/2ML IJ SOLN
INTRAMUSCULAR | Status: AC
  Filled 2013-11-20: qty 2

## 2013-11-20 MED ORDER — METOCLOPRAMIDE HCL 10 MG PO TABS: 5.0000 mg | ORAL_TABLET | Freq: Three times a day (TID) | ORAL | Status: DC | PRN

## 2013-11-20 MED ORDER — CEFUROXIME SODIUM 1.5 G IJ SOLR
INTRAMUSCULAR | Status: AC
  Filled 2013-11-20: qty 1.5

## 2013-11-20 MED ORDER — KCL IN DEXTROSE-NACL 20-5-0.45 MEQ/L-%-% IV SOLN
INTRAVENOUS | Status: DC
  Administered 2013-11-20: 1000 mL via INTRAVENOUS
  Administered 2013-11-21: via INTRAVENOUS
  Filled 2013-11-20 (×7): qty 1000

## 2013-11-20 MED ORDER — DIPHENHYDRAMINE HCL 12.5 MG/5ML PO ELIX: 12.5000 mg | ORAL_SOLUTION | ORAL | Status: DC | PRN

## 2013-11-20 SURGICAL SUPPLY — 59 items
BLADE 10 SAFETY STRL DISP (BLADE) ×3 IMPLANT
BLADE SAW SGTL 18X1.27X75 (BLADE) ×2 IMPLANT
BLADE SAW SGTL 18X1.27X75MM (BLADE) ×1
BRUSH FEMORAL CANAL (MISCELLANEOUS) IMPLANT
CAPT HIP PF COP ×2 IMPLANT
COVER BACK TABLE 24X17X13 BIG (DRAPES) IMPLANT
COVER SURGICAL LIGHT HANDLE (MISCELLANEOUS) ×6 IMPLANT
DRAPE ORTHO SPLIT 77X108 STRL (DRAPES) ×3
DRAPE PROXIMA HALF (DRAPES) ×3 IMPLANT
DRAPE SURG ORHT 6 SPLT 77X108 (DRAPES) ×1 IMPLANT
DRAPE U-SHAPE 47X51 STRL (DRAPES) ×3 IMPLANT
DRILL BIT 7/64X5 (BIT) ×3 IMPLANT
DRSG AQUACEL AG ADV 3.5X10 (GAUZE/BANDAGES/DRESSINGS) ×3 IMPLANT
DRSG AQUACEL AG ADV 3.5X14 (GAUZE/BANDAGES/DRESSINGS) ×4 IMPLANT
DURAPREP 26ML APPLICATOR (WOUND CARE) ×3 IMPLANT
ELECT BLADE 4.0 EZ CLEAN MEGAD (MISCELLANEOUS)
ELECT REM PT RETURN 9FT ADLT (ELECTROSURGICAL) ×3
ELECTRODE BLDE 4.0 EZ CLN MEGD (MISCELLANEOUS) IMPLANT
ELECTRODE REM PT RTRN 9FT ADLT (ELECTROSURGICAL) ×1 IMPLANT
GAUZE XEROFORM 1X8 LF (GAUZE/BANDAGES/DRESSINGS) ×3 IMPLANT
GLOVE BIO SURGEON STRL SZ 6.5 (GLOVE) ×1 IMPLANT
GLOVE BIO SURGEON STRL SZ7.5 (GLOVE) ×3 IMPLANT
GLOVE BIO SURGEON STRL SZ8.5 (GLOVE) ×6 IMPLANT
GLOVE BIO SURGEONS STRL SZ 6.5 (GLOVE) ×1
GLOVE BIOGEL PI IND STRL 6.5 (GLOVE) ×2 IMPLANT
GLOVE BIOGEL PI IND STRL 8 (GLOVE) ×2 IMPLANT
GLOVE BIOGEL PI IND STRL 9 (GLOVE) ×1 IMPLANT
GLOVE BIOGEL PI INDICATOR 6.5 (GLOVE) ×4
GLOVE BIOGEL PI INDICATOR 8 (GLOVE) ×4
GLOVE BIOGEL PI INDICATOR 9 (GLOVE) ×2
GOWN STRL REUS W/ TWL LRG LVL3 (GOWN DISPOSABLE) ×2 IMPLANT
GOWN STRL REUS W/ TWL XL LVL3 (GOWN DISPOSABLE) ×3 IMPLANT
GOWN STRL REUS W/TWL LRG LVL3 (GOWN DISPOSABLE) ×6
GOWN STRL REUS W/TWL XL LVL3 (GOWN DISPOSABLE) ×9
HANDPIECE INTERPULSE COAX TIP (DISPOSABLE)
HOOD PEEL AWAY FACE SHEILD DIS (HOOD) ×6 IMPLANT
KIT BASIN OR (CUSTOM PROCEDURE TRAY) ×3 IMPLANT
KIT ROOM TURNOVER OR (KITS) ×3 IMPLANT
MANIFOLD NEPTUNE II (INSTRUMENTS) ×3 IMPLANT
NEEDLE 22X1 1/2 (OR ONLY) (NEEDLE) ×3 IMPLANT
NS IRRIG 1000ML POUR BTL (IV SOLUTION) ×3 IMPLANT
PACK TOTAL JOINT (CUSTOM PROCEDURE TRAY) ×3 IMPLANT
PAD ARMBOARD 7.5X6 YLW CONV (MISCELLANEOUS) ×6 IMPLANT
PASSER SUT SWANSON 36MM LOOP (INSTRUMENTS) ×3 IMPLANT
PRESSURIZER FEMORAL UNIV (MISCELLANEOUS) IMPLANT
SET HNDPC FAN SPRY TIP SCT (DISPOSABLE) IMPLANT
SUT ETHIBOND 2 V 37 (SUTURE) ×3 IMPLANT
SUT VIC AB 0 CTB1 27 (SUTURE) ×3 IMPLANT
SUT VIC AB 1 CTX 36 (SUTURE) ×3
SUT VIC AB 1 CTX36XBRD ANBCTR (SUTURE) ×1 IMPLANT
SUT VIC AB 2-0 CTB1 (SUTURE) ×3 IMPLANT
SUT VIC AB 3-0 SH 27 (SUTURE) ×3
SUT VIC AB 3-0 SH 27X BRD (SUTURE) ×1 IMPLANT
SYR CONTROL 10ML LL (SYRINGE) ×3 IMPLANT
TOWEL OR 17X24 6PK STRL BLUE (TOWEL DISPOSABLE) ×3 IMPLANT
TOWEL OR 17X26 10 PK STRL BLUE (TOWEL DISPOSABLE) ×3 IMPLANT
TOWER CARTRIDGE SMART MIX (DISPOSABLE) IMPLANT
TRAY FOLEY CATH 14FR (SET/KITS/TRAYS/PACK) IMPLANT
WATER STERILE IRR 1000ML POUR (IV SOLUTION) ×12 IMPLANT

## 2013-11-20 NOTE — Transfer of Care (Signed)
Immediate Anesthesia Transfer of Care Note  Patient: Madison Stein  Procedure(s) Performed: Procedure(s): RIGHT TOTAL HIP ARTHROPLASTY (Right)  Patient Location: PACU  Anesthesia Type:General  Level of Consciousness: awake, alert  and oriented  Airway & Oxygen Therapy: Patient connected to face mask oxygen  Post-op Assessment: Post -op Vital signs reviewed and stable  Post vital signs: stable  Complications: No apparent anesthesia complications

## 2013-11-20 NOTE — Op Note (Signed)
OPERATIVE REPORT    DATE OF PROCEDURE:  11/20/2013       PREOPERATIVE DIAGNOSIS:   OSTEOARTHRITIS RIGHT HIP                                                          POSTOPERATIVE DIAGNOSIS:  OSTEOARTHRITIS RIGHT HIP                                                           PROCEDURE:  R total hip arthroplasty using a 50 mm DePuy Pinnacle  Cup, [employer], 10-degree polyethylene liner index superior  and posterior, a +0 36 mm ceramic head, a 18x13x42x160 SROM stem, 18Bsm Sleeve   SURGEON: Kerin Salen    ASSISTANT:   Kerry Hough. [employer]  (present throughout entire procedure and necessary for timely completion of the procedure)   ANESTHESIA: General BLOOD LOSS: 400 FLUID REPLACEMENT: 1500 crystalloid DRAINS: none    INDICATIONS FOR PROCEDURE: A 63 y.o. year-old With   [place]   for 2 years, x-rays show bone-on-bone arthritic changes. Despite conservative measures with observation, anti-inflammatory medicine, narcotics, use of a cane, to intra-articular injections of cortisone that provided temporary relief, has severe unremitting pain and can ambulate only a few blocks before resting.  Patient desires elective R total hip arthroplasty to decrease pain and increase function. The risks, benefits, and alternatives were discussed at length including but not limited to the risks of infection, bleeding, nerve injury, stiffness, blood clots, the need for revision surgery, cardiopulmonary complications, among others, and they were willing to proceed. Questions answered     PROCEDURE IN DETAIL: The patient was identified by armband,  received preoperative IV antibiotics in the holding area at [facility], taken to the operating room , appropriate anesthetic monitors  were attached and general endotracheal anesthesia induced. Foley catheter was inserted. Pt was rolled into the L lateral decubitus position and fixed there with a Stulberg Mark II pelvic clamp.   The R lower extremity was then prepped and draped  in the usual sterile fashion from the ankle to the hemipelvis. A time-out  procedure was performed. The skin along the lateral hip and thigh  infiltrated with 10 mL of 0.5% Marcaine and epinephrine solution. We  then made a posterolateral approach to the hip. With a #10 blade, a 20 cm  incision was made through the skin and subcutaneous tissue down to the level of the  IT band. Small bleeders were identified and cauterized. The IT band was cut in  line with skin incision exposing the greater trochanter. A Cobra retractor was placed between the gluteus minimus and the superior hip joint capsule, and a spiked Cobra between the quadratus femoris and the inferior hip joint capsule. This isolated the short  external rotators and piriformis tendons. These were tagged with a #2 Ethibond  suture and cut off their insertion on the intertrochanteric crest. The posterior  capsule was then developed into an acetabular-based flap from Posterior Superior off of the acetabulum out over the femoral neck and back posterior inferior to the acetabular rim. This flap  was tagged with two #2 Ethibond sutures and retracted protecting the sciatic nerve. This exposed the arthritic femoral head and osteophytes. The hip was then flexed and internally rotated, dislocating the femoral head and a standard neck cut performed 1 fingerbreadth above the lesser trochanter.  A spiked Cobra was placed in the cotyloid notch and a Hohmann retractor was then used to lever the femur anteriorly off of the anterior pelvic column. A posterior-inferior wing retractor was placed at the junction of the acetabulum and the ischium completing the acetabular exposure.We then removed the peripheral osteophytes and labrum from the acetabulum. We then reamed the acetabulum up to 49 mm with basket reamers obtaining good coverage in all quadrants. We then irrigated with normal  saline solution and hammered  into place a 50 mm pinnacle cup in 45  degrees of abduction and about 20 degrees of anteversion. More  peripheral osteophytes removed and a trial 10-degree liner placed with the  index superior-posterior. The hip was then flexed and internally rotated exposing the  proximal femur, which was entered with the initiating reamer followed by  the axial reamers up to a 13.5 mm full depth and 54mm partial depth. We then conically reamed to 18B to the correct depth for a 42 base neck. The calcar was milled to 18Bsm. A trial cone and stem was inserted in the 25 degrees anteversion, with a +0 13mm trial head. Trial reduction was then performed and excellent stability was noted with at 90 of flexion with 75 of internal rotation and then full extension with maximal external rotation. The hip could not be dislocated in full extension. The knee could easily flex  to about 130 degrees. We also stretched the abductors at this point,  because of the preexisting adductor contractures. All trial components  were then removed. The acetabulum was irrigated out with normal saline  solution. A titanium [facility] was then screwed into place  followed by a 10-degree polyethylene liner index superior-posterior. On  the femoral side a 18Bsm ZTT1 sleeve was hammered into place, followed by a [id number] SROM stem in 25 degrees of anteversion. At this point, a +0 36 mm ceramic head was  hammered on the stem. The hip was reduced. We checked our stability  one more time and found it to be excellent. The wound was once again  thoroughly irrigated out with normal saline solution pulse lavage. The  capsular flap and short external rotators were repaired back to the  intertrochanteric crest through drill holes with a #2 Ethibond suture.  The IT band was closed with running 1 Vicryl suture. The subcutaneous  tissue with 0 and 2-0 undyed Vicryl suture and the skin with running  interlocking 3-0 nylon suture. Dressing of  Xeroform and Mepilex was  then applied. The patient was then unclamped, rolled supine, awaken extubated and taken to recovery room without difficulty in stable condition.   Kerin Salen 11/20/2013, 2:49 PM

## 2013-11-20 NOTE — Anesthesia Preprocedure Evaluation (Signed)
Anesthesia Evaluation  Patient identified by MRN, date of birth, ID band Patient awake    Reviewed: Allergy & Precautions, H&P , NPO status , Patient's Chart, lab work & pertinent test results  Airway Mallampati: I TM Distance: >3 FB Neck ROM: Full    Dental  (+) Teeth Intact, Dental Advisory Given   Pulmonary former smoker,  breath sounds clear to auscultation        Cardiovascular Rhythm:Regular Rate:Normal     Neuro/Psych    GI/Hepatic   Endo/Other  diabetes  Renal/GU      Musculoskeletal   Abdominal   Peds  Hematology   Anesthesia Other Findings   Reproductive/Obstetrics                           Anesthesia Physical Anesthesia Plan  ASA: II  Anesthesia Plan: General   Post-op Pain Management:    Induction: Intravenous  Airway Management Planned: Oral ETT  Additional Equipment:   Intra-op Plan:   Post-operative Plan: Extubation in OR  Informed Consent: I have reviewed the patients History and Physical, chart, labs and discussed the procedure including the risks, benefits and alternatives for the proposed anesthesia with the patient or authorized representative who has indicated his/her understanding and acceptance.   Dental advisory given  Plan Discussed with: CRNA, Anesthesiologist and Surgeon  Anesthesia Plan Comments:         Anesthesia Quick Evaluation

## 2013-11-20 NOTE — Anesthesia Procedure Notes (Signed)
Procedure Name: Intubation Date/Time: 11/20/2013 1:26 PM Performed by: Maeola Harman Pre-anesthesia Checklist: Patient identified, Emergency Drugs available, Suction available, Patient being monitored and Timeout performed Patient Re-evaluated:Patient Re-evaluated prior to inductionPreoxygenation: Pre-oxygenation with 100% oxygen Intubation Type: IV induction Ventilation: Mask ventilation without difficulty and Oral airway inserted - appropriate to patient size Laryngoscope Size: Sabra Heck and 2 Grade View: Grade I Tube type: Oral Tube size: 7.5 mm Number of attempts: 1 Airway Equipment and Method: Stylet Placement Confirmation: ETT inserted through vocal cords under direct vision,  positive ETCO2 and breath sounds checked- equal and bilateral Secured at: 22 cm Tube secured with: Tape Dental Injury: Teeth and Oropharynx as per pre-operative assessment  Comments: Intubation by Arelia Sneddon.

## 2013-11-20 NOTE — Anesthesia Postprocedure Evaluation (Signed)
  Anesthesia Post-op Note  Patient: Madison Stein  Procedure(s) Performed: Procedure(s): RIGHT TOTAL HIP ARTHROPLASTY (Right)  Patient Location: PACU  Anesthesia Type:General  Level of Consciousness: awake, alert  and oriented  Airway and Oxygen Therapy: Patient Spontanous Breathing and Patient connected to nasal cannula oxygen  Post-op Pain: mild  Post-op Assessment: Post-op Vital signs reviewed  Post-op Vital Signs: Reviewed  Last Vitals:  Filed Vitals:   11/20/13 1600  BP: 106/44  Pulse: 79  Temp:   Resp: 19    Complications: No apparent anesthesia complications

## 2013-11-20 NOTE — Interval H&P Note (Signed)
History and Physical Interval Note:  11/20/2013 1:10 PM  Madison Stein  has presented today for surgery, with the diagnosis of OSTEOARTHRITIS RIGHT HIP  The various methods of treatment have been discussed with the patient and family. After consideration of risks, benefits and other options for treatment, the patient has consented to  Procedure(s): RIGHT TOTAL HIP ARTHROPLASTY (Right) as a surgical intervention .  The patient's history has been reviewed, patient examined, no change in status, stable for surgery.  I have reviewed the patient's chart and labs.  Questions were answered to the patient's satisfaction.     Kerin Salen

## 2013-11-21 LAB — GLUCOSE, CAPILLARY
GLUCOSE-CAPILLARY: 164 mg/dL — AB (ref 70–99)
GLUCOSE-CAPILLARY: 187 mg/dL — AB (ref 70–99)
Glucose-Capillary: 207 mg/dL — ABNORMAL HIGH (ref 70–99)
Glucose-Capillary: 201 mg/dL — ABNORMAL HIGH (ref 70–99)

## 2013-11-21 LAB — BASIC METABOLIC PANEL
BUN: 8 mg/dL (ref 6–23)
CO2: 25 mEq/L (ref 19–32)
CREATININE: 0.77 mg/dL (ref 0.50–1.10)
Calcium: 8.2 mg/dL — ABNORMAL LOW (ref 8.4–10.5)
Chloride: 103 mEq/L (ref 96–112)
GFR calc non Af Amer: 89 mL/min — ABNORMAL LOW (ref >90)
Glucose, Bld: 188 mg/dL — ABNORMAL HIGH (ref 70–99)
Potassium: 4.9 mEq/L (ref 3.7–5.3)
Sodium: 139 mEq/L (ref 137–147)

## 2013-11-21 LAB — CBC
HCT: 28.4 % — ABNORMAL LOW (ref 36.0–46.0)
Hemoglobin: 9.2 g/dL — ABNORMAL LOW (ref 12.0–15.0)
MCH: 27.5 pg (ref 26.0–34.0)
MCHC: 32.4 g/dL (ref 30.0–36.0)
MCV: 85 fL (ref 78.0–100.0)
PLATELETS: 227 10*3/uL (ref 150–400)
RBC: 3.34 MIL/uL — ABNORMAL LOW (ref 3.87–5.11)
RDW: 13.9 % (ref 11.5–15.5)
WBC: 10.7 10*3/uL — ABNORMAL HIGH (ref 4.0–10.5)

## 2013-11-21 LAB — HEMOGLOBIN A1C
Hgb A1c MFr Bld: 7.3 % — ABNORMAL HIGH (ref <5.7)
Mean Plasma Glucose: 163 mg/dL — ABNORMAL HIGH (ref <117)

## 2013-11-21 NOTE — Progress Notes (Signed)
PATIENT ID: Madison Stein  MRN: [id number]  DOB/AGE:  07-Feb-1951 / 63 y.o.  1 Day Post-Op Procedure(s) (LRB): RIGHT TOTAL HIP ARTHROPLASTY (Right)    PROGRESS NOTE Subjective: Patient is alert, oriented,no Nausea, no Vomiting, yes passing gas, no Bowel Movement. Taking PO well. Denies SOB, Chest or Calf Pain. Using Incentive Spirometer, PAS in place. Ambulate WBAt Patient reports pain as 7 on 0-10 scale  .    Objective: Vital signs in last 24 hours: Filed Vitals:   11/20/13 2000 11/20/13 2053 11/21/13 0148 11/21/13 0324  BP:  147/59 118/40 129/49  Pulse:  104 105 101  Temp:  98 F (36.7 C) 98.1 F (36.7 C) 97.9 F (36.6 C)  TempSrc:  Oral Oral Oral  Resp: 17 16 16 16   Weight:      SpO2:  97% 96% 98%      Intake/Output from previous day: I/O last 3 completed shifts: In: 2470 [P.O.:720; I.V.:1500; IV Piggyback:250] Out: 400 [Blood:400]   Intake/Output this shift:     LABORATORY DATA:  Recent Labs  11/20/13 1544 11/20/13 2142 11/21/13 0629 11/21/13 0630  WBC  --   --   --  10.7*  HGB  --   --   --  9.2*  HCT  --   --   --  28.4*  PLT  --   --   --  227  NA  --   --   --  139  K  --   --   --  4.9  CL  --   --   --  103  CO2  --   --   --  25  BUN  --   --   --  8  CREATININE  --   --   --  0.77  GLUCOSE  --   --   --  188*  GLUCAP 162* 223* 164*  --   CALCIUM  --   --   --  8.2*    Examination: Neurologically intact Neurovascular intact Sensation intact distally Intact pulses distally Dorsiflexion/Plantar flexion intact Incision: scant drainage No cellulitis present Compartment soft} XR AP&Lat of hip shows well placed\fixed THA  Assessment:   1 Day Post-Op Procedure(s) (LRB): RIGHT TOTAL HIP ARTHROPLASTY (Right) ADDITIONAL DIAGNOSIS:    Plan: PT/OT WBAT, THA  posterior precautions  DVT Prophylaxis: SCDx72 hrs, ASA 325 mg BID x 2 weeks  DISCHARGE PLAN: Home  DISCHARGE NEEDS: HHPT, HHRN, Walker and 3-in-1 comode seat

## 2013-11-21 NOTE — Progress Notes (Signed)
Physical Therapy Treatment Patient Details Name: Madison Stein MRN: [id number] DOB: Feb 28, 1951 Today's Date: 11/21/2013    History of Present Illness 63 y.o. female s/p right THA via posterior approach. Hx of diabetes    PT Comments    Pt progressing well towards physical therapy goals. Ambulates up to 125 feet with supervision, and has safely completed stair training. Pt is able to state 3/3 posterior hip precautions and continues to safely follow these precautions during functional mobility. Pertinent education has been reviewed. She will benefit from home health PT upon d/c. Will follow up with pt in AM for any further concerns and believe pt will be adequate for d/c home tomorrow (5/17) from PT standpoint.  Follow Up Recommendations  Home health PT;Supervision/Assistance - 24 hour     Equipment Recommendations  Rolling walker with 5" wheels;3in1 (PT)    Recommendations for Other Services OT consult     Precautions / Restrictions Precautions Precautions: Posterior Hip Precaution Booklet Issued: Yes (comment) Precaution Comments: reviewed precautions on handout Restrictions Weight Bearing Restrictions: Yes RLE Weight Bearing: Weight bearing as tolerated    Mobility  Bed Mobility Overal bed mobility: Needs Assistance Bed Mobility: Supine to Sit     Supine to sit: Supervision;HOB elevated     General bed mobility comments: supervision for safety with verbal cues for technique. HOB elevated. Pt requires extra time. Minimal use of rail  Transfers Overall transfer level: Needs assistance Equipment used: Rolling walker (2 wheeled) Transfers: Sit to/from Stand Sit to Stand: Supervision         General transfer comment: supervision for safety from lowest bed setting and BSC. Cues for hand placement  Ambulation/Gait Ambulation/Gait assistance: Supervision Ambulation Distance (Feet): 125 Feet Assistive device: Rolling walker (2 wheeled) Gait Pattern/deviations:  Step-through pattern;Decreased step length - left;Decreased stance time - right;Antalgic Gait velocity: decreased   General Gait Details: Supervision for safety. cues to increase left step length. Pt did not require standing restbreak. Reports fatigue but was able to continue further distance   Stairs Stairs: Yes Stairs assistance: Min assist Stair Management: No rails;Step to pattern;Backwards;With walker Number of Stairs: 2 (x2) General stair comments: min assist only to block RW. demonstrated technique for patient prior to practicing. Pt safely navigated with cues for sequencing and hand placement. was able to correctly teach back to therapist. She has no further questions concerning this task  Wheelchair Mobility    Modified Rankin (Stroke Patients Only)       Balance                                    Cognition Arousal/Alertness: Awake/alert Behavior During Therapy: WFL for tasks assessed/performed Overall Cognitive Status: Within Functional Limits for tasks assessed                      Exercises General Exercises - Lower Extremity Ankle Circles/Pumps: AROM;Both;10 reps;Seated    General Comments General comments (skin integrity, edema, etc.): Pt states 3/3 posterior hip precautions. Reviewed therapeutic exercises.  Requests OT consult for bathroom mobility and dressing.      Pertinent Vitals/Pain 7/10 pain Nurse notifed Patient repositioned in chair for comfort.     Home Living                      Prior Function            PT Goals (  current goals can now be found in the care plan section) Acute Rehab PT Goals Patient Stated Goal: go home PT Goal Formulation: With patient Time For Goal Achievement: 11/28/13 Potential to Achieve Goals: Good Progress towards PT goals: Progressing toward goals    Frequency  7X/week    PT Plan Current plan remains appropriate    Co-evaluation             End of Session Equipment  Utilized During Treatment: Gait belt Activity Tolerance: Patient tolerated treatment well Patient left: in chair;with call bell/phone within reach;with family/visitor present     Time: 1450-1515 PT Time Calculation (min): 25 min  Charges:  $Gait Training: 23-37 mins                    G Codes:      [place], [place]  Ellouise Newer 11/21/2013, 3:57 PM

## 2013-11-21 NOTE — Evaluation (Signed)
Physical Therapy Evaluation Patient Details Name: Madison Stein MRN: [id number] DOB: 1951/04/25 Today's Date: 11/21/2013   History of Present Illness  63 y.o. female s/p right THA via posterior approach. Hx of diabetes  Clinical Impression  Pt is s/p right THA resulting in the deficits listed below (see PT Problem List). Pt ambulates up to 100 feet with min guard for safety. She reports that she will have a sister staying with her for the next 2-3 weeks to provide 24 hr care as needed. Pt will benefit from skilled PT to increase their independence and safety with mobility to allow discharge to the venue listed below.      Follow Up Recommendations Home health PT;Supervision/Assistance - 24 hour    Equipment Recommendations  Rolling walker with 5" wheels;3in1 (PT)    Recommendations for Other Services OT consult     Precautions / Restrictions Precautions Precautions: Posterior Hip Precaution Booklet Issued: Yes (comment) Precaution Comments: reviewed precautions on handout Restrictions Weight Bearing Restrictions: Yes RLE Weight Bearing: Weight bearing as tolerated      Mobility  Bed Mobility Overal bed mobility: Needs Assistance Bed Mobility: Supine to Sit     Supine to sit: Supervision;HOB elevated     General bed mobility comments: supervision for safety with verbal cues for technique. HOB elevated. Pt requires extra time. Adequately uses rail. Minimal difficulty with trunk control  Transfers Overall transfer level: Needs assistance Equipment used: Rolling walker (2 wheeled) Transfers: Sit to/from Stand Sit to Stand: Min guard         General transfer comment: min guard with sit<>stand second person available for safety. pt voices correct hand placement. did not require physical assist but was slow to rise and relied heavily on RW. performed from lowest bed setting and recliner.  Ambulation/Gait Ambulation/Gait assistance: Min guard Ambulation Distance (Feet):  100 Feet Assistive device: Rolling walker (2 wheeled) Gait Pattern/deviations: Step-through pattern;Decreased step length - left;Decreased stance time - right;Antalgic Gait velocity: decreased   General Gait Details: pt required 2 standing rest breaks to reach 100 feet but overall ambulates generally well. minimal verbal cues for sequencing. Demonstrates good posture. Cues for increased left step length and responded well.  Stairs            Wheelchair Mobility    Modified Rankin (Stroke Patients Only)       Balance                                             Pertinent Vitals/Pain 8/10 pain Nurse notified Patient repositioned in chair for comfort.     Home Living Family/patient expects to be discharged to:: Private residence Living Arrangements: Alone Available Help at Discharge: Family;Available 24 hours/day Type of Home: House Home Access: Stairs to enter Entrance Stairs-Rails: None Entrance Stairs-Number of Steps: 2 Home Layout: One level Home Equipment: Shower seat - built in;Cane - single point      Prior Function Level of Independence: Independent with assistive device(s)         Comments: cane for ambulation     Hand Dominance   Dominant Hand: Left    Extremity/Trunk Assessment   Upper Extremity Assessment: Defer to OT evaluation           Lower Extremity Assessment: RLE deficits/detail;LLE deficits/detail RLE Deficits / Details: decreased strength and ROM LLE Deficits / Details: Hx of arthritis in  Left knee per pt. Painful and limited strength and ROM     Communication   Communication: No difficulties  Cognition Arousal/Alertness: Awake/alert Behavior During Therapy: WFL for tasks assessed/performed Overall Cognitive Status: Within Functional Limits for tasks assessed                      General Comments General comments (skin integrity, edema, etc.): Pt able to recall 2/3 posterior hip precautions at  start of therapy. Reviewed 3/3 precautions and handout for exercises.    Exercises General Exercises - Lower Extremity Ankle Circles/Pumps: AROM;Both;10 reps;Seated Quad Sets: AROM;Right;5 reps Gluteal Sets: Strengthening;Both;Seated      Assessment/Plan    PT Assessment Patient needs continued PT services  PT Diagnosis Difficulty walking;Abnormality of gait;Acute pain   PT Problem List Decreased strength;Decreased range of motion;Decreased activity tolerance;Decreased balance;Decreased mobility;Decreased knowledge of use of DME;Decreased knowledge of precautions;Pain  PT Treatment Interventions DME instruction;Gait training;Stair training;Functional mobility training;Therapeutic activities;Therapeutic exercise;Balance training;Neuromuscular re-education;Patient/family education;Modalities   PT Goals (Current goals can be found in the Care Plan section) Acute Rehab PT Goals Patient Stated Goal: go home PT Goal Formulation: With patient Time For Goal Achievement: 11/28/13 Potential to Achieve Goals: Good    Frequency 7X/week   Barriers to discharge        Co-evaluation               End of Session Equipment Utilized During Treatment: Gait belt Activity Tolerance: Patient tolerated treatment well Patient left: in chair;with call bell/phone within reach Nurse Communication: Mobility status;Patient requests pain meds         Time: 8850-2774 PT Time Calculation (min): 26 min   Charges:   PT Evaluation $Initial PT Evaluation Tier I: 1 Procedure PT Treatments $Gait Training: 8-22 mins   PT G CodesCamille Bal McNab, [place] 11/21/2013, 11:53 AM

## 2013-11-22 ENCOUNTER — Encounter (HOSPITAL_COMMUNITY): Payer: Self-pay | Admitting: *Deleted

## 2013-11-22 LAB — CBC
HCT: 26.4 % — ABNORMAL LOW (ref 36.0–46.0)
Hemoglobin: 8.7 g/dL — ABNORMAL LOW (ref 12.0–15.0)
MCH: 27.9 pg (ref 26.0–34.0)
MCHC: 33 g/dL (ref 30.0–36.0)
MCV: 84.6 fL (ref 78.0–100.0)
PLATELETS: 243 10*3/uL (ref 150–400)
RBC: 3.12 MIL/uL — AB (ref 3.87–5.11)
RDW: 13.8 % (ref 11.5–15.5)
WBC: 12 10*3/uL — AB (ref 4.0–10.5)

## 2013-11-22 LAB — GLUCOSE, CAPILLARY
Glucose-Capillary: 183 mg/dL — ABNORMAL HIGH (ref 70–99)
Glucose-Capillary: 190 mg/dL — ABNORMAL HIGH (ref 70–99)

## 2013-11-22 MED ORDER — OXYCODONE-ACETAMINOPHEN 5-325 MG PO TABS: 1.0000 | ORAL_TABLET | ORAL | Status: DC | PRN

## 2013-11-22 MED ORDER — ASPIRIN EC 325 MG PO TBEC: 325.0000 mg | DELAYED_RELEASE_TABLET | Freq: Two times a day (BID) | ORAL | Status: DC

## 2013-11-22 MED ORDER — METHOCARBAMOL 500 MG PO TABS: 500.0000 mg | ORAL_TABLET | Freq: Two times a day (BID) | ORAL | Status: DC

## 2013-11-22 NOTE — Progress Notes (Signed)
PATIENT ID: Madison Stein  MRN: [id number]  DOB/AGE:  61-30-1952 / 63 y.o.  2 Days Post-Op Procedure(s) (LRB): RIGHT TOTAL HIP ARTHROPLASTY (Right)    PROGRESS NOTE Subjective: Patient is alert, oriented,no Nausea, no Vomiting, yes passing gas, no Bowel Movement. Taking PO well. Denies SOB, Chest or Calf Pain. Using Incentive Spirometer, PAS in place. Ambulate WBAT Patient reports pain as moderate  .    Objective: Vital signs in last 24 hours: Filed Vitals:   11/21/13 0324 11/21/13 1450 11/21/13 2157 11/22/13 0652  BP: 129/49 114/62 117/52 119/50  Pulse: 101 115 118 111  Temp: 97.9 F (36.6 C) 98.4 F (36.9 C) 98.8 F (37.1 C) 99.8 F (37.7 C)  TempSrc: Oral  Oral Oral  Resp: 16 18 18 18   Weight:      SpO2: 98% 96% 94% 99%      Intake/Output from previous day: I/O last 3 completed shifts: In: 1680 [P.O.:1680] Out: -    Intake/Output this shift:     LABORATORY DATA:  Recent Labs  11/21/13 0630  11/21/13 1601 11/21/13 2156 11/22/13 0531 11/22/13 0610  WBC 10.7*  --   --   --   --  12.0*  HGB 9.2*  --   --   --   --  8.7*  HCT 28.4*  --   --   --   --  26.4*  PLT 227  --   --   --   --  243  NA 139  --   --   --   --   --   K 4.9  --   --   --   --   --   CL 103  --   --   --   --   --   CO2 25  --   --   --   --   --   BUN 8  --   --   --   --   --   CREATININE 0.77  --   --   --   --   --   GLUCOSE 188*  --   --   --   --   --   GLUCAP  --   < > 187* 201* 183*  --   CALCIUM 8.2*  --   --   --   --   --   < > = values in this interval not displayed.  Examination: Neurologically intact Neurovascular intact Sensation intact distally Intact pulses distally Dorsiflexion/Plantar flexion intact Incision: scant drainage No cellulitis present Compartment soft} XR AP&Lat of hip shows well placed\fixed THA  Assessment:   2 Days Post-Op Procedure(s) (LRB): RIGHT TOTAL HIP ARTHROPLASTY (Right) ADDITIONAL DIAGNOSIS:  Plan: PT/OT WBAT, THA  posterior  precautions  DVT Prophylaxis: SCDx72 hrs, ASA 325 mg BID x 2 weeks  DISCHARGE PLAN: Home  DISCHARGE NEEDS: HHPT, HHRN, Walker and 3-in-1 comode seat

## 2013-11-22 NOTE — Discharge Summary (Signed)
Patient ID: Madison Stein MRN: [id number] DOB/AGE: August 12, 1950 63 y.o.  Admit date: 11/20/2013 Discharge date: 11/22/2013  Admission Diagnoses:  Active Problems:   Arthritis of right hip   Discharge Diagnoses:  Same  Past Medical History  Diagnosis Date  . Diabetes mellitus without complication     takes Metformin daily  . Depression     takes Cymbalta daily  . Family history of anesthesia complication     mom and sister get very sick  . Hyperlipidemia     not on meds right now;watching what she eats  . Pneumonia 5yrs ago    hx of  . History of bronchitis 9yrs ago  . Fibromyalgia   . Arthritis   . Joint pain   . Thyroid nodule   . Insomnia     has Ambien if needed  . History of shingles   . Complication of anesthesia   . PONV (postoperative nausea and vomiting)     Surgeries: Procedure(s): RIGHT TOTAL HIP ARTHROPLASTY on 11/20/2013   Consultants:    Discharged Condition: Improved  Hospital Course: Madison Stein is an 63 y.o. female who was admitted 11/20/2013 for operative treatment of<principal problem not specified>. Patient has severe unremitting pain that affects sleep, daily activities, and work/hobbies. After pre-op clearance the patient was taken to the operating room on 11/20/2013 and underwent  Procedure(s): RIGHT TOTAL HIP ARTHROPLASTY.    Patient was given perioperative antibiotics: Anti-infectives   Start     Dose/Rate Route Frequency Ordered Stop   11/20/13 0600  ceFAZolin (ANCEF) IVPB 2 g/50 mL premix     2 g 100 mL/hr over 30 Minutes Intravenous On call to O.R. 11/19/13 1413 11/20/13 1330       Patient was given sequential compression devices, early ambulation, and chemoprophylaxis to prevent DVT.  Patient benefited maximally from hospital stay and there were no complications.    Recent vital signs: Patient Vitals for the past 24 hrs:  BP Temp Temp src Pulse Resp SpO2  11/22/13 0652 119/50 mmHg 99.8 F (37.7 C) Oral 111 18 99 %  11/21/13 2157  117/52 mmHg 98.8 F (37.1 C) Oral 118 18 94 %  11/21/13 1450 114/62 mmHg 98.4 F (36.9 C) - 115 18 96 %     Recent laboratory studies:  Recent Labs  11/21/13 0630 11/22/13 0610  WBC 10.7* 12.0*  HGB 9.2* 8.7*  HCT 28.4* 26.4*  PLT 227 243  NA 139  --   K 4.9  --   CL 103  --   CO2 25  --   BUN 8  --   CREATININE 0.77  --   GLUCOSE 188*  --   CALCIUM 8.2*  --      Discharge Medications:     Medication List    STOP taking these medications       HYDROcodone-acetaminophen 5-325 MG per tablet  Commonly known as:  NORCO/VICODIN      TAKE these medications       ARTHROTEC 75-0.2 MG Tbec  Generic drug:  Diclofenac-Misoprostol  Take 1 tablet by mouth 2 (two) times daily.     aspirin EC 325 MG tablet  Take 1 tablet (325 mg total) by mouth 2 (two) times daily.     DULoxetine 60 MG capsule  Commonly known as:  CYMBALTA  Take 60 mg by mouth 2 (two) times daily.     metFORMIN 1000 MG tablet  Commonly known as:  GLUCOPHAGE  Take 1,000 mg by  mouth 2 (two) times daily with a meal.     methocarbamol 500 MG tablet  Commonly known as:  ROBAXIN  Take 1 tablet (500 mg total) by mouth 2 (two) times daily with a meal.     multivitamin with minerals Tabs tablet  Take 1 tablet by mouth at bedtime.     oxyCODONE-acetaminophen 5-325 MG per tablet  Commonly known as:  ROXICET  Take 1 tablet by mouth every 4 (four) hours as needed.        Diagnostic Studies: Dg Chest 2 View  11/16/2013   CLINICAL DATA:  Diabetes.  EXAM: CHEST  2 VIEW  COMPARISON:  None.  FINDINGS: The heart size and mediastinal contours are within normal limits. Both lungs are clear. No pneumothorax or pleural effusion is noted. Anterior osteophyte formation is noted in lower thoracic spine.  IMPRESSION: No acute cardiopulmonary abnormality seen.   Electronically Signed   By: Sabino Dick M.D.   On: 11/16/2013 10:25   Dg Pelvis Portable  11/20/2013   CLINICAL DATA:  Status post right total hip arthroplasty.   EXAM: PORTABLE PELVIS 1-2 VIEWS  COMPARISON:  None.  FINDINGS: Status post right total hip arthroplasty. The femoral and acetabular components are well situated. No acute fracture or dislocation is noted.  IMPRESSION: Status post right total hip arthroplasty.   Electronically Signed   By: Sabino Dick M.D.   On: 11/20/2013 16:29    Disposition:       Discharge Instructions   Call MD / Call 911    Complete by:  As directed   If you experience chest pain or shortness of breath, CALL 911 and be transported to the hospital emergency room.  If you develope a fever above 101 F, pus (white drainage) or increased drainage or redness at the wound, or calf pain, call your surgeon's office.     Change dressing    Complete by:  As directed   You may change your dressing on day 5, then change the dressing daily with sterile 4 x 4 inch gauze dressing and paper tape.  You may clean the incision with alcohol prior to redressing     Constipation Prevention    Complete by:  As directed   Drink plenty of fluids.  Prune juice may be helpful.  You may use a stool softener, such as Colace (over the counter) 100 mg twice a day.  Use MiraLax (over the counter) for constipation as needed.     Diet - low sodium heart healthy    Complete by:  As directed      Discharge instructions    Complete by:  As directed   Follow up in office with Dr. Mayer Camel in 2 weeks.     Driving restrictions    Complete by:  As directed   No driving for 2 weeks     Follow the hip precautions as taught in Physical Therapy    Complete by:  As directed      Increase activity slowly as tolerated    Complete by:  As directed      Patient may shower    Complete by:  As directed   You may shower without a dressing once there is no drainage.  Do not wash over the wound.  If drainage remains, cover wound with plastic wrap and then shower.           Follow-up Information   Follow up with Kerin Salen, MD In 2 weeks.  Specialty:   Orthopedic Surgery   Contact information:   [address] [place] [address] [phone]        Signed: Leighton Parody 11/22/2013, 8:01 AM

## 2013-11-22 NOTE — Progress Notes (Signed)
Discharge instructions and prescriptions given to and explained to patient. Patient denies questions or concerns at this time. IV removed with no complications. Vital signs stable. Patient discharged via wheelchair with 3 in 1 and walker.

## 2013-11-22 NOTE — Care Management Note (Signed)
    Page 1 of 2   11/22/2013     5:13:04 PM CARE MANAGEMENT NOTE 11/22/2013  Patient:  Madison Stein, Madison Stein   Account Number:  [id number]  Date Initiated:  11/22/2013  Documentation initiated by:  [facility]  Subjective/Objective Assessment:   adm: arthritis of R hip     Action/Plan:   discharge planning   Anticipated DC Date:  11/22/2013   Anticipated DC Plan:  [place]  CM consult      [facility] Choice  HOME HEALTH   Choice offered to / List presented to:  C-1 Patient   DME arranged  3-N-1  [place]assie Moselle      DME agency  [place] arranged  HH-1 RN  [place].   Status of service:  Completed, signed off Medicare Important Message given?   (If response is "NO", the following Medicare IM given date fields will be blank) Date Medicare IM given:   Date Additional Medicare IM given:    Discharge Disposition:  [place]  Per UR Regulation:    If discussed at Long Length of Stay Meetings, dates discussed:    Comments:  11/22/13 09:00 CM received call from [facility] as pt had arranged [place] services pre-surgically.  [facility] will render HHPT/OT/RN. Pt has all DME needed.  No other Cm needs were communicated.  Mariane Masters, BSN, [place].

## 2013-11-22 NOTE — Progress Notes (Signed)
Physical Therapy Treatment Patient Details Name: Madison Stein MRN: [id number] DOB: 01-07-1951 Today's Date: 11/22/2013    History of Present Illness 63 y.o. female s/p right THA via posterior approach. Hx of diabetes    PT Comments    Pt tolerated treatment well, ambulates up to 150 feet with supervision for safety. Reviewed precautions with illustrated handout and therapeutic exercises. Pt states she feels confident in returning home under the care of her sister and has no further questions or concerns. Pertinent education has been reviewed and she is safe for d/c from PT standpoint. She will greatly benefit from continued HHPT services in order to further increase her independence with functional mobility.   Follow Up Recommendations  Home health PT;Supervision/Assistance - 24 hour     Equipment Recommendations  Rolling walker with 5" wheels;3in1 (PT)    Recommendations for Other Services OT consult     Precautions / Restrictions Precautions Precautions: Posterior Hip Precaution Booklet Issued: Yes (comment) Precaution Comments: reviewed precautions on handout Restrictions Weight Bearing Restrictions: Yes RLE Weight Bearing: Weight bearing as tolerated    Mobility  Bed Mobility               General bed mobility comments: Pt in chair at start/finish of therapy  Transfers Overall transfer level: Modified independent Equipment used: Rolling walker (2 wheeled) Transfers: Sit to/from Stand Sit to Stand: Modified independent (Device/Increase time)         General transfer comment: Mod I with sit<>stand from recliner. correct hand placement. does not need cues or physical assist.  Ambulation/Gait Ambulation/Gait assistance: Supervision Ambulation Distance (Feet): 150 Feet Assistive device: Rolling walker (2 wheeled) Gait Pattern/deviations: Step-through pattern;Decreased step length - left;Decreased stance time - right Gait velocity: decreased   General Gait  Details: Supervision for safety. required one 3 standing rest breaks during this bout and reports this was due to pain. Cues to ease grip on RW and to take take additional breaks as needed for safety.   Stairs            Wheelchair Mobility    Modified Rankin (Stroke Patients Only)       Balance Overall balance assessment: Modified Independent                                  Cognition Arousal/Alertness: Awake/alert Behavior During Therapy: WFL for tasks assessed/performed Overall Cognitive Status: Within Functional Limits for tasks assessed                      Exercises General Exercises - Lower Extremity Ankle Circles/Pumps: AROM;Both;Seated;15 reps Long Arc Quad: AROM;Right;15 reps;Seated Heel Slides: AAROM;Right;15 reps;Supine Hip ABduction/ADduction: AAROM;Right;15 reps;Supine Straight Leg Raises: AAROM;Right;15 reps;Supine    General Comments General comments (skin integrity, edema, etc.): Recalls 3/3 precautions with minimal cues. Reviewed handout for precautions with illustrations. Pt does demonstrate adequate safety with mobility by following precautions      Pertinent Vitals/Pain Pt reports pain improves as she increases mobility. States nursing recently gave her medication for pain and it is slowly beginning to take effect. No numerical value given for pain. Patient repositioned in chair for comfort.     Home Living                      Prior Function            PT Goals (current goals can now be  found in the care plan section) Acute Rehab PT Goals Patient Stated Goal: go home PT Goal Formulation: With patient Time For Goal Achievement: 11/28/13 Potential to Achieve Goals: Good Progress towards PT goals: Progressing toward goals    Frequency  7X/week    PT Plan Current plan remains appropriate    Co-evaluation             End of Session   Activity Tolerance: Patient tolerated treatment well Patient  left: in chair;with call bell/phone within reach     Time: 0827-0852 PT Time Calculation (min): 25 min  Charges:  $Gait Training: 8-22 mins $Therapeutic Exercise: 8-22 mins                    G Codes:      [employer], [place]  Madison Stein 11/22/2013, 9:02 AM

## 2013-11-22 NOTE — Progress Notes (Addendum)
Occupational Therapy Evaluation and Discharge Patient Details Name: Madison Stein MRN: [id number] DOB: [birth year] Today's Date: 11/22/2013    History of Present Illness 63 y.o. female s/p right THA via posterior approach. Hx of diabetes   Clinical Impression   PTA pt lived at home alone and was independent with assistive devices [facility]) for ADLs and functional mobility. Pt reports her sister will stay with her for 2 weeks to provide 24/7 assistance and sister has history of 2 THA surgeries. Pt declined OOB activities due to pain and fatigue from PT and brief assessment performed at bed level to assess performance of ADLs. Per PT note, pt ambulating 152ft with Supervision and Mod Independent for sit<>stand transfers this morning. Education and training completed for energy conservation, fall prevention, and home safety with increased independence in ADLs. No further acute OT needs.     Follow Up Recommendations  No OT follow up;Supervision/Assistance - 24 hour    Equipment Recommendations  3 in 1 bedside comode       Precautions / Restrictions Precautions Precautions: Posterior Hip Precaution Booklet Issued: Yes (comment) Precaution Comments: reviewed precautions on handout Restrictions Weight Bearing Restrictions: Yes RLE Weight Bearing: Weight bearing as tolerated      Mobility Bed Mobility               General bed mobility comments: Not assessed- pt resting comfortably in bed and is fatigued following PT.   Transfers         General transfer comment: Not assessed- pt declined OOB activities due to fatigue from PT. Per PT note, pt at [facility] with sit<>stand and does not requires cues or assist.         ADL Overall ADL's : Needs assistance/impaired Eating/Feeding: Independent;Sitting   Grooming: Supervision/safety;Standing   Upper Body Bathing: Set up;Sitting       Upper Body Dressing : Set up;Sitting                     General ADL  Comments: Pt declined OOB activities this date due to fatigue from PT. However, per PT note pt at [place] level for sit<>stand transfers and supervision for ambulation of 133ft. Expect pt to be at [place] level for LB ADLs and toilet/shower transfer with sit<>stand. Pt Bil UEs WFL. Educated pt on fall prevention, energy conservation, and compensatory techniques for LB ADLs. Pt reports that sister will be staying with her for 2 weeks to assist with ADLs.      Vision  Per pt report, no change from baseline.                   Perception Perception Perception Tested?: No   Praxis Praxis Praxis tested?: Within functional limits    Pertinent Vitals/Pain Pt reports pain in surgical site following PT and requests medication. RN notified and applied ice pack.     Hand Dominance Left   Extremity/Trunk Assessment Upper Extremity Assessment Upper Extremity Assessment: Overall WFL for tasks assessed   Lower Extremity Assessment Lower Extremity Assessment: Defer to PT evaluation       Communication Communication Communication: No difficulties   Cognition Arousal/Alertness: Awake/alert Behavior During Therapy: WFL for tasks assessed/performed Overall Cognitive Status: Within Functional Limits for tasks assessed                        General Comments: Pt recalled 3/3 precautions with min verbal cueing (for internal rotation). Reviewed handout with  pt and educated on incorporating precautions into ADLs.         Home Living Family/patient expects to be discharged to:: Private residence Living Arrangements: Alone Available Help at Discharge: Family;Available 24 hours/day Type of Home: House Home Access: Stairs to enter [employer] of Steps: 2 Entrance Stairs-Rails: None Home Layout: One level     Bathroom Shower/Tub: Walk-in Hydrologist: Standard     Home Equipment: Shower seat - built in;Cane - single  point          Prior Functioning/Environment Level of Independence: Independent with assistive device(s)        Comments: cane for ambulation                              End of Session Nurse Communication: Patient requests pain meds  Activity Tolerance: Patient limited by fatigue;Patient limited by pain Patient left: in bed;with call bell/phone within reach   Time: [phone] OT Time Calculation (min): 8 min Charges:  OT General Charges $OT Visit: 1 Procedure OT Evaluation $Initial OT Evaluation Tier I: 1 Procedure  Juluis Rainier [phone] 11/22/2013, 9:56 AM

## 2013-11-23 NOTE — Progress Notes (Signed)
Utilization review completed.  

## 2013-11-24 ENCOUNTER — Encounter (HOSPITAL_COMMUNITY): Payer: Self-pay | Admitting: Orthopedic Surgery

## 2014-01-01 NOTE — OR Nursing (Signed)
Addendum to scope page 

## 2014-07-23 ENCOUNTER — Other Ambulatory Visit: Payer: Self-pay | Admitting: Orthopedic Surgery

## 2014-08-20 ENCOUNTER — Encounter (HOSPITAL_COMMUNITY)
Admission: RE | Admit: 2014-08-20 | Discharge: 2014-08-20 | Disposition: A | Discharge status: Home / Self Care | Payer: 59 | Source: Ambulatory Visit | Attending: Orthopedic Surgery | Admitting: Orthopedic Surgery

## 2014-08-20 ENCOUNTER — Encounter (HOSPITAL_COMMUNITY): Payer: Self-pay

## 2014-08-20 DIAGNOSIS — Z01818 Encounter for other preprocedural examination: Secondary | ICD-10-CM | POA: Insufficient documentation

## 2014-08-20 DIAGNOSIS — M1612 Unilateral primary osteoarthritis, left hip: Secondary | ICD-10-CM | POA: Diagnosis not present

## 2014-08-20 DIAGNOSIS — Z0183 Encounter for blood typing: Secondary | ICD-10-CM | POA: Insufficient documentation

## 2014-08-20 HISTORY — DX: Polyneuropathy, unspecified: G62.9

## 2014-08-20 LAB — SURGICAL PCR SCREEN
MRSA, PCR: NEGATIVE
Staphylococcus aureus: POSITIVE — AB

## 2014-08-20 LAB — BASIC METABOLIC PANEL
Anion gap: 11 (ref 5–15)
BUN: 27 mg/dL — AB (ref 6–23)
CHLORIDE: 101 mmol/L (ref 96–112)
CO2: 25 mmol/L (ref 19–32)
Calcium: 9.7 mg/dL (ref 8.4–10.5)
Creatinine, Ser: 1.08 mg/dL (ref 0.50–1.10)
GFR calc non Af Amer: 54 mL/min — ABNORMAL LOW (ref >90)
GFR, EST AFRICAN AMERICAN: 63 mL/min — AB (ref >90)
GLUCOSE: 225 mg/dL — AB (ref 70–99)
POTASSIUM: 4.7 mmol/L (ref 3.5–5.1)
Sodium: 137 mmol/L (ref 135–145)

## 2014-08-20 LAB — PROTIME-INR
INR: 0.96 (ref 0.00–1.49)
PROTHROMBIN TIME: 12.9 s (ref 11.6–15.2)

## 2014-08-20 LAB — TYPE AND SCREEN
ABO/RH(D): O POS
Antibody Screen: NEGATIVE

## 2014-08-20 LAB — CBC
HCT: 40.1 % (ref 36.0–46.0)
Hemoglobin: 12.9 g/dL (ref 12.0–15.0)
MCH: 26.1 pg (ref 26.0–34.0)
MCHC: 32.2 g/dL (ref 30.0–36.0)
MCV: 81.2 fL (ref 78.0–100.0)
PLATELETS: 243 10*3/uL (ref 150–400)
RBC: 4.94 MIL/uL (ref 3.87–5.11)
RDW: 14.1 % (ref 11.5–15.5)
WBC: 8.4 10*3/uL (ref 4.0–10.5)

## 2014-08-20 LAB — APTT: APTT: 30 s (ref 24–37)

## 2014-08-20 NOTE — Pre-Procedure Instructions (Signed)
Madison Stein  08/20/2014   Your procedure is scheduled on:  Mon, Feb 22 @ 7:30 AM  Report to  at [facility] @ 5:30 AM.  Call this number if you have problems the morning of surgery: [phone]   Remember:   Do not eat food or drink liquids after midnight.   Take these medicines the morning of surgery with A SIP OF WATER: Cymbalta(Duloxetine),Pain Pill(if needed),and Lyrica(Pregabalin)                Stop taking your Lovaza,Arthrotec,and Aspirin a week before surgery. No Goody's,BC's,Aleve,Ibuprofen,Fish Oil,or any Herbal Medications   Do not wear jewelry, make-up or nail polish.  Do not wear lotions, powders, or perfumes. You may wear deodorant.  Do not shave 48 hours prior to surgery.   Do not bring valuables to the hospital.  [facility] is not responsible                  for any belongings or valuables.               Contacts, dentures or bridgework may not be worn into surgery.  Leave suitcase in the car. After surgery it may be brought to your room.  For patients admitted to the hospital, discharge time is determined by your                treatment team.               Patients discharged the day of surgery will not be allowed to drive  home.    Special Instructions:  [place] - Preparing for Surgery  Before surgery, you can play an important role.  Because skin is not sterile, your skin needs to be as free of germs as possible.  You can reduce the number of germs on you skin by washing with CHG (chlorahexidine gluconate) soap before surgery.  CHG is an antiseptic cleaner which kills germs and bonds with the skin to continue killing germs even after washing.  Please DO NOT use if you have an allergy to CHG or antibacterial soaps.  If your skin becomes reddened/irritated stop using the CHG and inform your nurse when you arrive at Short Stay.  Do not shave (including legs and underarms) for at least 48 hours prior to the first CHG shower.  You may shave your  face.  Please follow these instructions carefully:   1.  Shower with CHG Soap the night before surgery and the                                morning of Surgery.  2.  If you choose to wash your hair, wash your hair first as usual with your       normal shampoo.  3.  After you shampoo, rinse your hair and body thoroughly to remove the                      Shampoo.  4.  Use CHG as you would any other liquid soap.  You can apply chg directly       to the skin and wash gently with scrungie or a clean washcloth.  5.  Apply the CHG Soap to your body ONLY FROM THE NECK DOWN.        Do not use on open wounds or open sores.  Avoid contact with your  eyes,       ears, mouth and genitals (private parts).  Wash genitals (private parts)       with your normal soap.  6.  Wash thoroughly, paying special attention to the area where your surgery        will be performed.  7.  Thoroughly rinse your body with warm water from the neck down.  8.  DO NOT shower/wash with your normal soap after using and rinsing off       the CHG Soap.  9.  Pat yourself dry with a clean towel.            10.  Wear clean pajamas.            11.  Place clean sheets on your bed the night of your first shower and do not        sleep with pets.  Day of Surgery  Do not apply any lotions/deoderants the morning of surgery.  Please wear clean clothes to the hospital/surgery center.     Please read over the following fact sheets that you were given: Pain Booklet, Coughing and Deep Breathing, MRSA Information and Surgical Site Infection Prevention

## 2014-08-20 NOTE — Progress Notes (Signed)
Pt doesn't have a cardiologist  Denies ever having an echo/stress test/heart cath  Medical MD is . Darrol Jump PA with [facility]  EKG and CXR in epic from 11-09-13

## 2014-08-20 NOTE — Progress Notes (Signed)
Mupirocin script called into [facility]  [place] in [place]

## 2014-08-28 NOTE — H&P (Signed)
TOTAL HIP ADMISSION H&P  Patient is admitted for left total hip arthroplasty.  Subjective:  Chief Complaint: left hip pain  HPI: Madison Stein, 64 y.o. female, has a history of pain and functional disability in the left hip(s) due to arthritis and patient has failed non-surgical conservative treatments for greater than 12 weeks to include NSAID's and/or analgesics, flexibility and strengthening excercises, supervised PT with diminished ADL's post treatment, weight reduction as appropriate and activity modification.  Onset of symptoms was gradual starting several years ago with gradually worsening course since that time.The patient noted no past surgery on the left hip(s).  Patient currently rates pain in the left hip at 10 out of 10 with activity. Patient has night pain, worsening of pain with activity and weight bearing, pain that interfers with activities of daily living and pain with passive range of motion. Patient has evidence of joint space narrowing by imaging studies. This condition presents safety issues increasing the risk of falls.  There is no current active infection.  Patient Active Problem List   Diagnosis Date Noted  . Arthritis of right hip 11/20/2013   Past Medical History  Diagnosis Date  . Diabetes mellitus without complication     takes Metformin daily  . Depression     takes Cymbalta daily  . Hyperlipidemia     takes Fish Oil bid  . History of bronchitis 46yrs ago  . Arthritis   . Joint pain   . Thyroid nodule   . Insomnia     has Ambien if needed  . History of shingles   . Complication of anesthesia   . PONV (postoperative nausea and vomiting)   . Family history of anesthesia complication     mom and sister get very sick  . Pneumonia 57yrs ago    hx of  . Fibromyalgia   . Peripheral neuropathy     takes Lyrica daily    Past Surgical History  Procedure Laterality Date  . Carpal tunnel release Right 1985  . Back surgery  1989  . Ulnar nerve release Left  1996  . Cholecystectomy  2007  . Refractive surgery  2008  . Cervical fusion  2009  . Colonoscopy    . Esophagogastroduodenoscopy    . 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 Total hip arthroplasty Right 11/20/2013    Procedure: RIGHT TOTAL HIP ARTHROPLASTY;  Surgeon: Kerin Salen, MD;  Location: [facility];  Service: Orthopedics;  Laterality: Right;  . Tubal ligation  1987    No prescriptions prior to admission   Allergies  Allergen Reactions  . Morphine And Related Nausea And Vomiting    History  Substance Use Topics  . Smoking status: Former Research scientist (life sciences)  . Smokeless tobacco: Not on file     Comment: quit smoking in 1997  . Alcohol Use: Yes     Comment: rare beer    No family history on file.   Review of Systems  Constitutional: Positive for malaise/fatigue and diaphoresis.  Gastrointestinal: Positive for diarrhea and constipation.  Musculoskeletal: Positive for myalgias and joint pain.  Neurological: Positive for dizziness.  Psychiatric/Behavioral: Positive for depression. The patient is nervous/anxious and has insomnia.     Objective:  Physical Exam  Constitutional: She is oriented to person, place, and time. She appears well-developed and well-nourished.  HENT:  Head: Normocephalic and atraumatic.  Eyes: Pupils are equal, round, and reactive to light.  Neck: Normal range of motion. Neck supple.  Cardiovascular: Intact distal  pulses.   Respiratory: Effort normal.  Musculoskeletal: She exhibits tenderness.  The left hip is irritable to internal rotation and about 35 no pain with external rotation scan is again intact no erythema no swelling.  Neurological: She is alert and oriented to person, place, and time.  Skin: Skin is warm and dry.  Psychiatric: She has a normal mood and affect. Her behavior is normal. Judgment and thought content normal.    Vital signs in last 24 hours:    Labs:   Estimated body mass index is 41.55 kg/(m^2) as calculated from the following:    Height as of 11/22/13: 5\' 4"  (1.626 m).   Weight as of 11/16/13: 109.861 kg (242 lb 3.2 oz).   Imaging Review Plain radiographs demonstrate moderate degenerative joint disease of the left hip(s). The bone quality appears to be good for age and reported activity level.  Assessment/Plan:  End stage arthritis, left hip(s)  The patient history, physical examination, clinical judgement of the provider and imaging studies are consistent with end stage degenerative joint disease of the left hip(s) and total hip arthroplasty is deemed medically necessary. The treatment options including medical management, injection therapy, arthroscopy and arthroplasty were discussed at length. The risks and benefits of total hip arthroplasty were presented and reviewed. The risks due to aseptic loosening, infection, stiffness, dislocation/subluxation,  thromboembolic complications and other imponderables were discussed.  The patient acknowledged the explanation, agreed to proceed with the plan and consent was signed. Patient is being admitted for inpatient treatment for surgery, pain control, PT, OT, prophylactic antibiotics, VTE prophylaxis, progressive ambulation and ADL's and discharge planning.The patient is planning to be discharged home with home health services

## 2014-08-29 DIAGNOSIS — M1612 Unilateral primary osteoarthritis, left hip: Secondary | ICD-10-CM | POA: Diagnosis present

## 2014-08-29 NOTE — Anesthesia Preprocedure Evaluation (Addendum)
Anesthesia Evaluation  Patient identified by MRN, date of birth, ID band Patient awake    Reviewed: Allergy & Precautions, Patient's Chart, lab work & pertinent test results  History of Anesthesia Complications (+) PONV  Airway Mallampati: II   Neck ROM: Full    Dental  (+) Teeth Intact, Dental Advisory Given   Pulmonary neg pulmonary ROS, former smoker,  breath sounds clear to auscultation        Cardiovascular Rhythm:Regular     Neuro/Psych Depression    GI/Hepatic negative GI ROS, Neg liver ROS,   Endo/Other  diabetes, Poorly Controlled, Type 2  Renal/GU GFR 60     Musculoskeletal  (+) Fibromyalgia -  Abdominal (+) + obese,   Peds  Hematology 12/40   Anesthesia Other Findings   Reproductive/Obstetrics                           Anesthesia Physical Anesthesia Plan  ASA: III  Anesthesia Plan: General   Post-op Pain Management:    Induction: Intravenous  Airway Management Planned: Oral ETT  Additional Equipment:   Intra-op Plan:   Post-operative Plan: Extubation in OR  Informed Consent: I have reviewed the patients History and Physical, chart, labs and discussed the procedure including the risks, benefits and alternatives for the proposed anesthesia with the patient or authorized representative who has indicated his/her understanding and acceptance.     Plan Discussed with:   Anesthesia Plan Comments: (Grade II, 7.5, miller 2, easy mask, multimodal pain RX)        Anesthesia Quick Evaluation

## 2014-08-30 ENCOUNTER — Encounter (HOSPITAL_COMMUNITY): Payer: Self-pay | Admitting: *Deleted

## 2014-08-30 ENCOUNTER — Encounter (HOSPITAL_COMMUNITY)
Admission: RE | Disposition: A | Discharge status: Home Health | Payer: Self-pay | Source: Ambulatory Visit | Attending: Orthopedic Surgery

## 2014-08-30 ENCOUNTER — Inpatient Hospital Stay (HOSPITAL_COMMUNITY): Payer: 59 | Admitting: Anesthesiology

## 2014-08-30 ENCOUNTER — Inpatient Hospital Stay (HOSPITAL_COMMUNITY): Payer: 59

## 2014-08-30 ENCOUNTER — Inpatient Hospital Stay (HOSPITAL_COMMUNITY)
Admission: RE | Admit: 2014-08-30 | Discharge: 2014-08-31 | DRG: 470 | Disposition: A | Discharge status: Home Health | Payer: 59 | Source: Ambulatory Visit | Attending: Orthopedic Surgery | Admitting: Orthopedic Surgery

## 2014-08-30 DIAGNOSIS — E6609 Other obesity due to excess calories: Secondary | ICD-10-CM | POA: Diagnosis present

## 2014-08-30 DIAGNOSIS — G4700 Insomnia, unspecified: Secondary | ICD-10-CM | POA: Diagnosis present

## 2014-08-30 DIAGNOSIS — M1612 Unilateral primary osteoarthritis, left hip: Secondary | ICD-10-CM | POA: Diagnosis present

## 2014-08-30 DIAGNOSIS — Z6841 Body Mass Index (BMI) 40.0 and over, adult: Secondary | ICD-10-CM

## 2014-08-30 DIAGNOSIS — D62 Acute posthemorrhagic anemia: Secondary | ICD-10-CM | POA: Diagnosis not present

## 2014-08-30 DIAGNOSIS — Z885 Allergy status to narcotic agent status: Secondary | ICD-10-CM

## 2014-08-30 DIAGNOSIS — Z96641 Presence of right artificial hip joint: Secondary | ICD-10-CM | POA: Diagnosis present

## 2014-08-30 DIAGNOSIS — M25552 Pain in left hip: Secondary | ICD-10-CM | POA: Diagnosis present

## 2014-08-30 DIAGNOSIS — Z7982 Long term (current) use of aspirin: Secondary | ICD-10-CM | POA: Diagnosis not present

## 2014-08-30 DIAGNOSIS — G629 Polyneuropathy, unspecified: Secondary | ICD-10-CM | POA: Diagnosis present

## 2014-08-30 DIAGNOSIS — F329 Major depressive disorder, single episode, unspecified: Secondary | ICD-10-CM | POA: Diagnosis present

## 2014-08-30 DIAGNOSIS — Z79899 Other long term (current) drug therapy: Secondary | ICD-10-CM | POA: Diagnosis not present

## 2014-08-30 DIAGNOSIS — Z96649 Presence of unspecified artificial hip joint: Secondary | ICD-10-CM

## 2014-08-30 DIAGNOSIS — Z87891 Personal history of nicotine dependence: Secondary | ICD-10-CM

## 2014-08-30 DIAGNOSIS — E119 Type 2 diabetes mellitus without complications: Secondary | ICD-10-CM | POA: Diagnosis present

## 2014-08-30 DIAGNOSIS — M797 Fibromyalgia: Secondary | ICD-10-CM | POA: Diagnosis present

## 2014-08-30 DIAGNOSIS — M1611 Unilateral primary osteoarthritis, right hip: Secondary | ICD-10-CM

## 2014-08-30 DIAGNOSIS — E785 Hyperlipidemia, unspecified: Secondary | ICD-10-CM | POA: Diagnosis present

## 2014-08-30 DIAGNOSIS — M1610 Unilateral primary osteoarthritis, unspecified hip: Secondary | ICD-10-CM | POA: Diagnosis present

## 2014-08-30 HISTORY — PX: TOTAL HIP ARTHROPLASTY: SHX124

## 2014-08-30 LAB — GLUCOSE, CAPILLARY
GLUCOSE-CAPILLARY: 168 mg/dL — AB (ref 70–99)
Glucose-Capillary: 131 mg/dL — ABNORMAL HIGH (ref 70–99)
Glucose-Capillary: 182 mg/dL — ABNORMAL HIGH (ref 70–99)
Glucose-Capillary: 307 mg/dL — ABNORMAL HIGH (ref 70–99)

## 2014-08-30 SURGERY — ARTHROPLASTY, HIP, TOTAL,POSTERIOR APPROACH
Anesthesia: General | Laterality: Left

## 2014-08-30 MED ORDER — DULOXETINE HCL 60 MG PO CPEP
60.0000 mg | ORAL_CAPSULE | Freq: Two times a day (BID) | ORAL | Status: DC
  Administered 2014-08-30 – 2014-08-31 (×3): 60 mg via ORAL
  Filled 2014-08-30 (×3): qty 1

## 2014-08-30 MED ORDER — DICLOFENAC-MISOPROSTOL 75-0.2 MG PO TBEC: 1.0000 | DELAYED_RELEASE_TABLET | Freq: Two times a day (BID) | ORAL | Status: DC

## 2014-08-30 MED ORDER — EPHEDRINE SULFATE 50 MG/ML IJ SOLN
INTRAMUSCULAR | Status: DC | PRN
  Administered 2014-08-30: 10 mg via INTRAVENOUS

## 2014-08-30 MED ORDER — LIDOCAINE HCL (CARDIAC) 20 MG/ML IV SOLN
INTRAVENOUS | Status: AC
  Filled 2014-08-30: qty 5

## 2014-08-30 MED ORDER — FENTANYL CITRATE 0.05 MG/ML IJ SOLN
INTRAMUSCULAR | Status: AC
  Filled 2014-08-30: qty 5

## 2014-08-30 MED ORDER — BUPIVACAINE-EPINEPHRINE 0.5% -1:200000 IJ SOLN
INTRAMUSCULAR | Status: DC | PRN
  Administered 2014-08-30: 20 mL

## 2014-08-30 MED ORDER — HYDROMORPHONE HCL 1 MG/ML IJ SOLN
0.5000 mg | INTRAMUSCULAR | Status: DC | PRN
  Administered 2014-08-30 (×2): 1 mg via INTRAVENOUS
  Filled 2014-08-30 (×2): qty 1

## 2014-08-30 MED ORDER — NEOSTIGMINE METHYLSULFATE 10 MG/10ML IV SOLN
INTRAVENOUS | Status: DC | PRN
Start: 2014-08-30 — End: 2014-08-30
  Administered 2014-08-30: 3 mg via INTRAVENOUS
  Administered 2014-08-30: 2 mg via INTRAVENOUS

## 2014-08-30 MED ORDER — MIDAZOLAM HCL 5 MG/5ML IJ SOLN
INTRAMUSCULAR | Status: DC | PRN
  Administered 2014-08-30 (×2): 1 mg via INTRAVENOUS

## 2014-08-30 MED ORDER — PHENYLEPHRINE HCL 10 MG/ML IJ SOLN
INTRAMUSCULAR | Status: AC
  Filled 2014-08-30: qty 1

## 2014-08-30 MED ORDER — PROPOFOL 10 MG/ML IV BOLUS
INTRAVENOUS | Status: AC
  Filled 2014-08-30: qty 20

## 2014-08-30 MED ORDER — ACETAMINOPHEN 10 MG/ML IV SOLN
INTRAVENOUS | Status: AC
  Filled 2014-08-30: qty 100

## 2014-08-30 MED ORDER — DEXAMETHASONE SODIUM PHOSPHATE 10 MG/ML IJ SOLN
10.0000 mg | Freq: Once | INTRAMUSCULAR | Status: AC
  Administered 2014-08-31: 10 mg via INTRAVENOUS
  Filled 2014-08-30: qty 1

## 2014-08-30 MED ORDER — ALBUMIN HUMAN 5 % IV SOLN
INTRAVENOUS | Status: DC | PRN
  Administered 2014-08-30: 08:00:00 via INTRAVENOUS

## 2014-08-30 MED ORDER — ASPIRIN EC 325 MG PO TBEC
325.0000 mg | DELAYED_RELEASE_TABLET | Freq: Every day | ORAL | Status: DC
  Administered 2014-08-31: 325 mg via ORAL
  Filled 2014-08-30: qty 1

## 2014-08-30 MED ORDER — CEFAZOLIN SODIUM-DEXTROSE 2-3 GM-% IV SOLR
2.0000 g | Freq: Once | INTRAVENOUS | Status: AC
  Administered 2014-08-30: 2 g via INTRAVENOUS
  Filled 2014-08-30 (×2): qty 50

## 2014-08-30 MED ORDER — ASPIRIN EC 325 MG PO TBEC: 325.0000 mg | DELAYED_RELEASE_TABLET | Freq: Two times a day (BID) | ORAL | Status: DC

## 2014-08-30 MED ORDER — SENNOSIDES-DOCUSATE SODIUM 8.6-50 MG PO TABS
1.0000 | ORAL_TABLET | Freq: Every evening | ORAL | Status: DC | PRN
  Administered 2014-08-30: 1 via ORAL
  Filled 2014-08-30: qty 1

## 2014-08-30 MED ORDER — ROCURONIUM BROMIDE 50 MG/5ML IV SOLN
INTRAVENOUS | Status: AC
  Filled 2014-08-30: qty 1

## 2014-08-30 MED ORDER — PHENYLEPHRINE HCL 10 MG/ML IJ SOLN
INTRAMUSCULAR | Status: DC | PRN
  Administered 2014-08-30 (×6): 80 ug via INTRAVENOUS

## 2014-08-30 MED ORDER — GLYCOPYRROLATE 0.2 MG/ML IJ SOLN
INTRAMUSCULAR | Status: AC
  Filled 2014-08-30: qty 3

## 2014-08-30 MED ORDER — PROMETHAZINE HCL 25 MG/ML IJ SOLN: 6.2500 mg | INTRAMUSCULAR | Status: DC | PRN

## 2014-08-30 MED ORDER — PREGABALIN 75 MG PO CAPS
150.0000 mg | ORAL_CAPSULE | Freq: Every day | ORAL | Status: DC
  Administered 2014-08-30 – 2014-08-31 (×2): 150 mg via ORAL
  Filled 2014-08-30 (×2): qty 2

## 2014-08-30 MED ORDER — ACETAMINOPHEN 325 MG PO TABS: 650.0000 mg | ORAL_TABLET | Freq: Four times a day (QID) | ORAL | Status: DC | PRN

## 2014-08-30 MED ORDER — 0.9 % SODIUM CHLORIDE (POUR BTL) OPTIME
TOPICAL | Status: DC | PRN
  Administered 2014-08-30: 1000 mL

## 2014-08-30 MED ORDER — ONDANSETRON HCL 4 MG/2ML IJ SOLN
INTRAMUSCULAR | Status: AC
  Filled 2014-08-30: qty 2

## 2014-08-30 MED ORDER — METOCLOPRAMIDE HCL 10 MG PO TABS: 5.0000 mg | ORAL_TABLET | Freq: Three times a day (TID) | ORAL | Status: DC | PRN

## 2014-08-30 MED ORDER — ALBUTEROL SULFATE HFA 108 (90 BASE) MCG/ACT IN AERS
INHALATION_SPRAY | RESPIRATORY_TRACT | Status: DC | PRN
  Administered 2014-08-30: 3 via RESPIRATORY_TRACT

## 2014-08-30 MED ORDER — ARTIFICIAL TEARS OP OINT
TOPICAL_OINTMENT | OPHTHALMIC | Status: AC
  Filled 2014-08-30: qty 3.5

## 2014-08-30 MED ORDER — KCL IN DEXTROSE-NACL 20-5-0.45 MEQ/L-%-% IV SOLN
INTRAVENOUS | Status: DC
  Filled 2014-08-30 (×8): qty 1000

## 2014-08-30 MED ORDER — DEXTROSE 5 % IV SOLN
500.0000 mg | Freq: Four times a day (QID) | INTRAVENOUS | Status: DC | PRN
  Administered 2014-08-30: 500 mg via INTRAVENOUS
  Filled 2014-08-30 (×2): qty 5

## 2014-08-30 MED ORDER — INSULIN ASPART 100 UNIT/ML ~~LOC~~ SOLN
0.0000 [IU] | Freq: Three times a day (TID) | SUBCUTANEOUS | Status: DC
  Administered 2014-08-30: 11 [IU] via SUBCUTANEOUS
  Administered 2014-08-31: 3 [IU] via SUBCUTANEOUS
  Administered 2014-08-31: 5 [IU] via SUBCUTANEOUS

## 2014-08-30 MED ORDER — OXYCODONE HCL 5 MG PO TABS
5.0000 mg | ORAL_TABLET | ORAL | Status: DC | PRN
  Administered 2014-08-30 – 2014-08-31 (×6): 10 mg via ORAL
  Filled 2014-08-30 (×6): qty 2

## 2014-08-30 MED ORDER — MIDAZOLAM HCL 2 MG/2ML IJ SOLN
INTRAMUSCULAR | Status: AC
  Filled 2014-08-30: qty 2

## 2014-08-30 MED ORDER — ONDANSETRON HCL 4 MG PO TABS: 4.0000 mg | ORAL_TABLET | Freq: Four times a day (QID) | ORAL | Status: DC | PRN

## 2014-08-30 MED ORDER — PHENYLEPHRINE 40 MCG/ML (10ML) SYRINGE FOR IV PUSH (FOR BLOOD PRESSURE SUPPORT)
PREFILLED_SYRINGE | INTRAVENOUS | Status: AC
  Filled 2014-08-30: qty 10

## 2014-08-30 MED ORDER — FLEET ENEMA 7-19 GM/118ML RE ENEM: 1.0000 | ENEMA | Freq: Once | RECTAL | Status: AC | PRN

## 2014-08-30 MED ORDER — ONDANSETRON HCL 4 MG/2ML IJ SOLN
4.0000 mg | Freq: Four times a day (QID) | INTRAMUSCULAR | Status: DC | PRN
  Administered 2014-08-30: 4 mg via INTRAVENOUS
  Filled 2014-08-30: qty 2

## 2014-08-30 MED ORDER — MENTHOL 3 MG MT LOZG
1.0000 | LOZENGE | OROMUCOSAL | Status: DC | PRN
  Filled 2014-08-30 (×2): qty 9

## 2014-08-30 MED ORDER — ALUMINUM HYDROXIDE GEL 320 MG/5ML PO SUSP
15.0000 mL | ORAL | Status: DC | PRN
  Filled 2014-08-30: qty 30

## 2014-08-30 MED ORDER — OXYCODONE-ACETAMINOPHEN 5-325 MG PO TABS: 1.0000 | ORAL_TABLET | ORAL | Status: DC | PRN

## 2014-08-30 MED ORDER — ROCURONIUM BROMIDE 100 MG/10ML IV SOLN
INTRAVENOUS | Status: DC | PRN
  Administered 2014-08-30: 10 mg via INTRAVENOUS
  Administered 2014-08-30: 40 mg via INTRAVENOUS

## 2014-08-30 MED ORDER — DIPHENHYDRAMINE HCL 12.5 MG/5ML PO ELIX: 12.5000 mg | ORAL_SOLUTION | ORAL | Status: DC | PRN

## 2014-08-30 MED ORDER — FENTANYL CITRATE 0.05 MG/ML IJ SOLN
INTRAMUSCULAR | Status: DC | PRN
  Administered 2014-08-30: 100 ug via INTRAVENOUS
  Administered 2014-08-30 (×3): 50 ug via INTRAVENOUS

## 2014-08-30 MED ORDER — SUCCINYLCHOLINE CHLORIDE 20 MG/ML IJ SOLN
INTRAMUSCULAR | Status: AC
  Filled 2014-08-30: qty 1

## 2014-08-30 MED ORDER — LACTATED RINGERS IV SOLN
INTRAVENOUS | Status: DC | PRN
  Administered 2014-08-30 (×2): via INTRAVENOUS

## 2014-08-30 MED ORDER — METHOCARBAMOL 500 MG PO TABS: 500.0000 mg | ORAL_TABLET | Freq: Two times a day (BID) | ORAL | Status: DC

## 2014-08-30 MED ORDER — METHOCARBAMOL 500 MG PO TABS
500.0000 mg | ORAL_TABLET | Freq: Four times a day (QID) | ORAL | Status: DC | PRN
  Administered 2014-08-30: 500 mg via ORAL
  Filled 2014-08-30 (×3): qty 1

## 2014-08-30 MED ORDER — NEOSTIGMINE METHYLSULFATE 10 MG/10ML IV SOLN
INTRAVENOUS | Status: AC
  Filled 2014-08-30: qty 1

## 2014-08-30 MED ORDER — MEPERIDINE HCL 25 MG/ML IJ SOLN: 6.2500 mg | INTRAMUSCULAR | Status: DC | PRN

## 2014-08-30 MED ORDER — ARTIFICIAL TEARS OP OINT
TOPICAL_OINTMENT | OPHTHALMIC | Status: DC | PRN
  Administered 2014-08-30: 1 via OPHTHALMIC

## 2014-08-30 MED ORDER — ACETAMINOPHEN 10 MG/ML IV SOLN
INTRAVENOUS | Status: DC | PRN
  Administered 2014-08-30: 1000 mg via INTRAVENOUS

## 2014-08-30 MED ORDER — LIDOCAINE HCL (CARDIAC) 20 MG/ML IV SOLN
INTRAVENOUS | Status: DC | PRN
  Administered 2014-08-30: 100 mg via INTRAVENOUS

## 2014-08-30 MED ORDER — DEXAMETHASONE SODIUM PHOSPHATE 4 MG/ML IJ SOLN
INTRAMUSCULAR | Status: AC
  Filled 2014-08-30: qty 1

## 2014-08-30 MED ORDER — METOCLOPRAMIDE HCL 5 MG/ML IJ SOLN
5.0000 mg | Freq: Three times a day (TID) | INTRAMUSCULAR | Status: DC | PRN
  Administered 2014-08-30: 10 mg via INTRAVENOUS
  Filled 2014-08-30: qty 2

## 2014-08-30 MED ORDER — ONDANSETRON HCL 4 MG/2ML IJ SOLN
INTRAMUSCULAR | Status: DC | PRN
  Administered 2014-08-30: 4 mg via INTRAVENOUS

## 2014-08-30 MED ORDER — DICLOFENAC SODIUM 75 MG PO TBEC
75.0000 mg | DELAYED_RELEASE_TABLET | Freq: Two times a day (BID) | ORAL | Status: DC
  Administered 2014-08-30 – 2014-08-31 (×3): 75 mg via ORAL
  Filled 2014-08-30 (×5): qty 1

## 2014-08-30 MED ORDER — ACETAMINOPHEN 650 MG RE SUPP: 650.0000 mg | Freq: Four times a day (QID) | RECTAL | Status: DC | PRN

## 2014-08-30 MED ORDER — EPHEDRINE SULFATE 50 MG/ML IJ SOLN
INTRAMUSCULAR | Status: AC
  Filled 2014-08-30: qty 1

## 2014-08-30 MED ORDER — FENTANYL CITRATE 0.05 MG/ML IJ SOLN
25.0000 ug | INTRAMUSCULAR | Status: DC | PRN
  Administered 2014-08-30 (×2): 25 ug via INTRAVENOUS
  Administered 2014-08-30: 50 ug via INTRAVENOUS

## 2014-08-30 MED ORDER — TRANEXAMIC ACID 100 MG/ML IV SOLN
1000.0000 mg | INTRAVENOUS | Status: AC
  Administered 2014-08-30: 1000 mg via INTRAVENOUS
  Filled 2014-08-30: qty 10

## 2014-08-30 MED ORDER — MISOPROSTOL 200 MCG PO TABS
200.0000 ug | ORAL_TABLET | Freq: Two times a day (BID) | ORAL | Status: DC
  Administered 2014-08-30 – 2014-08-31 (×3): 200 ug via ORAL
  Filled 2014-08-30 (×5): qty 1

## 2014-08-30 MED ORDER — GLYCOPYRROLATE 0.2 MG/ML IJ SOLN
INTRAMUSCULAR | Status: DC | PRN
  Administered 2014-08-30: 0.2 mg via INTRAVENOUS
  Administered 2014-08-30: 0.4 mg via INTRAVENOUS

## 2014-08-30 MED ORDER — PHENOL 1.4 % MT LIQD: 1.0000 | OROMUCOSAL | Status: DC | PRN

## 2014-08-30 MED ORDER — FLUMAZENIL 0.5 MG/5ML IV SOLN
0.2000 mg | INTRAVENOUS | Status: AC
  Administered 2014-08-30: 0.2 mg via INTRAVENOUS
  Filled 2014-08-30: qty 5

## 2014-08-30 MED ORDER — METFORMIN HCL 500 MG PO TABS
1000.0000 mg | ORAL_TABLET | Freq: Two times a day (BID) | ORAL | Status: DC
  Administered 2014-08-30 – 2014-08-31 (×2): 1000 mg via ORAL
  Filled 2014-08-30 (×2): qty 2

## 2014-08-30 MED ORDER — FENTANYL CITRATE 0.05 MG/ML IJ SOLN
INTRAMUSCULAR | Status: AC
  Administered 2014-08-30: 25 ug via INTRAVENOUS
  Filled 2014-08-30: qty 2

## 2014-08-30 MED ORDER — DEXAMETHASONE SODIUM PHOSPHATE 4 MG/ML IJ SOLN
INTRAMUSCULAR | Status: DC | PRN
  Administered 2014-08-30: 4 mg via INTRAVENOUS

## 2014-08-30 MED ORDER — STERILE WATER FOR INJECTION IJ SOLN
INTRAMUSCULAR | Status: AC
  Filled 2014-08-30: qty 10

## 2014-08-30 MED ORDER — PROPOFOL 10 MG/ML IV BOLUS
INTRAVENOUS | Status: DC | PRN
  Administered 2014-08-30: 30 mg via INTRAVENOUS
  Administered 2014-08-30: 20 mg via INTRAVENOUS
  Administered 2014-08-30: 150 mg via INTRAVENOUS

## 2014-08-30 MED ORDER — BISACODYL 5 MG PO TBEC: 5.0000 mg | DELAYED_RELEASE_TABLET | Freq: Every day | ORAL | Status: DC | PRN

## 2014-08-30 MED ORDER — BUPIVACAINE-EPINEPHRINE (PF) 0.5% -1:200000 IJ SOLN
INTRAMUSCULAR | Status: AC
  Filled 2014-08-30: qty 30

## 2014-08-30 MED ORDER — DOCUSATE SODIUM 100 MG PO CAPS
100.0000 mg | ORAL_CAPSULE | Freq: Two times a day (BID) | ORAL | Status: DC
  Administered 2014-08-30 – 2014-08-31 (×3): 100 mg via ORAL
  Filled 2014-08-30 (×3): qty 1

## 2014-08-30 MED ORDER — SCOPOLAMINE 1 MG/3DAYS TD PT72
1.0000 | MEDICATED_PATCH | Freq: Once | TRANSDERMAL | Status: DC
  Administered 2014-08-30: 1.5 mg via TRANSDERMAL
  Filled 2014-08-30: qty 1

## 2014-08-30 SURGICAL SUPPLY — 58 items
BLADE SAW SGTL 18X1.27X75 (BLADE) ×2 IMPLANT
BRUSH FEMORAL CANAL (MISCELLANEOUS) IMPLANT
CAPT HIP TOTAL 2 ×1 IMPLANT
COVER BACK TABLE 24X17X13 BIG (DRAPES) ×2 IMPLANT
COVER SURGICAL LIGHT HANDLE (MISCELLANEOUS) ×4 IMPLANT
DRAPE IMP U-DRAPE 54X76 (DRAPES) ×2 IMPLANT
DRAPE ORTHO SPLIT 77X108 STRL (DRAPES) ×2
DRAPE PROXIMA HALF (DRAPES) ×2 IMPLANT
DRAPE SURG ORHT 6 SPLT 77X108 (DRAPES) ×1 IMPLANT
DRAPE U-SHAPE 47X51 STRL (DRAPES) ×2 IMPLANT
DRILL BIT 7/64X5 (BIT) ×2 IMPLANT
DRSG AQUACEL AG ADV 3.5X10 (GAUZE/BANDAGES/DRESSINGS) ×2 IMPLANT
DRSG AQUACEL AG ADV 3.5X14 (GAUZE/BANDAGES/DRESSINGS) ×1 IMPLANT
DURAPREP 26ML APPLICATOR (WOUND CARE) ×2 IMPLANT
ELECT BLADE 4.0 EZ CLEAN MEGAD (MISCELLANEOUS)
ELECT REM PT RETURN 9FT ADLT (ELECTROSURGICAL) ×2
ELECTRODE BLDE 4.0 EZ CLN MEGD (MISCELLANEOUS) IMPLANT
ELECTRODE REM PT RTRN 9FT ADLT (ELECTROSURGICAL) ×1 IMPLANT
GLOVE BIO SURGEON STRL SZ7.5 (GLOVE) ×3 IMPLANT
GLOVE BIO SURGEON STRL SZ8.5 (GLOVE) ×4 IMPLANT
GLOVE BIOGEL PI IND STRL 7.5 (GLOVE) ×1 IMPLANT
GLOVE BIOGEL PI IND STRL 8 (GLOVE) ×2 IMPLANT
GLOVE BIOGEL PI IND STRL 9 (GLOVE) ×1 IMPLANT
GLOVE BIOGEL PI INDICATOR 7.5 (GLOVE) ×1
GLOVE BIOGEL PI INDICATOR 8 (GLOVE) ×2
GLOVE BIOGEL PI INDICATOR 9 (GLOVE) ×2
GOWN STRL REUS W/ TWL LRG LVL3 (GOWN DISPOSABLE) ×2 IMPLANT
GOWN STRL REUS W/ TWL XL LVL3 (GOWN DISPOSABLE) ×3 IMPLANT
GOWN STRL REUS W/TWL LRG LVL3 (GOWN DISPOSABLE) ×4
GOWN STRL REUS W/TWL XL LVL3 (GOWN DISPOSABLE) ×2
HANDPIECE INTERPULSE COAX TIP (DISPOSABLE)
HOOD PEEL AWAY FACE SHEILD DIS (HOOD) ×4 IMPLANT
KIT BASIN OR (CUSTOM PROCEDURE TRAY) ×2 IMPLANT
KIT ROOM TURNOVER OR (KITS) ×2 IMPLANT
LINER MARATHON 10 DEG 32MMX450 (Hips) ×2 IMPLANT
LINER MARATHON 10D 32MMX450 (Hips) IMPLANT
MANIFOLD NEPTUNE II (INSTRUMENTS) ×2 IMPLANT
NEEDLE 22X1 1/2 (OR ONLY) (NEEDLE) ×2 IMPLANT
NS IRRIG 1000ML POUR BTL (IV SOLUTION) ×2 IMPLANT
PACK TOTAL JOINT (CUSTOM PROCEDURE TRAY) ×2 IMPLANT
PACK UNIVERSAL I (CUSTOM PROCEDURE TRAY) ×2 IMPLANT
PAD ARMBOARD 7.5X6 YLW CONV (MISCELLANEOUS) ×4 IMPLANT
PASSER SUT SWANSON 36MM LOOP (INSTRUMENTS) ×2 IMPLANT
PRESSURIZER FEMORAL UNIV (MISCELLANEOUS) IMPLANT
SET HNDPC FAN SPRY TIP SCT (DISPOSABLE) IMPLANT
SUT ETHIBOND 2 V 37 (SUTURE) ×2 IMPLANT
SUT VIC AB 0 CTB1 27 (SUTURE) ×3 IMPLANT
SUT VIC AB 1 CTX 36 (SUTURE) ×2
SUT VIC AB 1 CTX36XBRD ANBCTR (SUTURE) ×1 IMPLANT
SUT VIC AB 2-0 CTB1 (SUTURE) ×2 IMPLANT
SUT VIC AB 3-0 SH 27 (SUTURE) ×2
SUT VIC AB 3-0 SH 27X BRD (SUTURE) ×1 IMPLANT
SYR CONTROL 10ML LL (SYRINGE) ×2 IMPLANT
TOWEL OR 17X24 6PK STRL BLUE (TOWEL DISPOSABLE) ×2 IMPLANT
TOWEL OR 17X26 10 PK STRL BLUE (TOWEL DISPOSABLE) ×2 IMPLANT
TOWER CARTRIDGE SMART MIX (DISPOSABLE) IMPLANT
TRAY FOLEY CATH 14FR (SET/KITS/TRAYS/PACK) IMPLANT
WATER STERILE IRR 1000ML POUR (IV SOLUTION) ×4 IMPLANT

## 2014-08-30 NOTE — Anesthesia Postprocedure Evaluation (Signed)
  Anesthesia Post-op Note  Patient: Madison Stein  Procedure(s) Performed: Procedure(s): TOTAL HIP ARTHROPLASTY (Left)  Patient Location: PACU  Anesthesia Type:General  Level of Consciousness: awake, oriented and sedated  Airway and Oxygen Therapy: Patient Spontanous Breathing and Patient connected to nasal cannula oxygen  Post-op Pain: mild  Post-op Assessment: Post-op Vital signs reviewed, Patient's Cardiovascular Status Stable, Respiratory Function Stable, Patent Airway and No signs of Nausea or vomiting  Post-op Vital Signs: Reviewed and stable  Last Vitals:  Filed Vitals:   08/30/14 0608  BP: 140/76  Pulse: 93  Temp: 36.9 C  Resp: 18    Complications: No apparent anesthesia complications and Patient re-intubated

## 2014-08-30 NOTE — Op Note (Signed)
OPERATIVE REPORT    DATE OF PROCEDURE:  08/30/2014       PREOPERATIVE DIAGNOSIS:  LEFT HIP OSTEOARTHRITIS                                                           POSTOPERATIVE DIAGNOSIS:  LEFT HIP OSTEOARTHRITIS                                                            PROCEDURE:  L total hip arthroplasty using a 50 mm DePuy Pinnacle  Cup, [employer], 10-degree polyethylene liner index superior  and posterior, a +0 36 mm ceramic head, a 18x13x150x42 SROM stem, 18Bsm Sleeve   SURGEON: Makoto Sellitto Stein    ASSISTANT:   [employer]  (present throughout entire procedure and necessary for timely completion of the procedure)   ANESTHESIA: General BLOOD LOSS: 400 FLUID REPLACEMENT: 1500 crystalloid DRAINS: Foley Catheter URINE OUTPUT: 160FU COMPLICATIONS: none    INDICATIONS FOR PROCEDURE: A 64 y.o. year-old With  LEFT HIP OSTEOARTHRITIS    for 3 years, x-rays show bone-on-bone arthritic changes. Despite conservative measures with observation, anti-inflammatory medicine, narcotics, use of a cane, has severe unremitting pain and can ambulate only a few blocks before resting.  Patient desires elective L total hip arthroplasty to decrease pain and increase function. The risks, benefits, and alternatives were discussed at length including but not limited to the risks of infection, bleeding, nerve injury, stiffness, blood clots, the need for revision surgery, cardiopulmonary complications, among others, and they were willing to proceed. Questions answered     PROCEDURE IN DETAIL: The patient was identified by armband,  received preoperative IV antibiotics in the holding area at [facility], taken to the operating room , appropriate anesthetic monitors  were attached and general endotracheal anesthesia induced. Foley catheter was inserted. Pt was rolled into the R lateral decubitus position and fixed there with a Stulberg Mark II pelvic clamp.  The L lower extremity was  then prepped and draped  in the usual sterile fashion from the ankle to the hemipelvis. A time-out  procedure was performed. The skin along the lateral hip and thigh  infiltrated with 10 mL of 0.5% Marcaine and epinephrine solution. We  then made a posterolateral approach to the hip. With a #10 blade, a 20 cm  incision was made through the skin and subcutaneous tissue down to the level of the  IT band. Small bleeders were identified and cauterized. The IT band was cut in  line with skin incision exposing the greater trochanter. A Cobra retractor was placed between the gluteus minimus and the superior hip joint capsule, and a spiked Cobra between the quadratus femoris and the inferior hip joint capsule. This isolated the short  external rotators and piriformis tendons. These were tagged with a #2 Ethibond  suture and cut off their insertion on the intertrochanteric crest. The posterior  capsule was then developed into an acetabular-based flap from Posterior Superior off of the acetabulum out over the femoral neck and back posterior inferior to the acetabular rim. This flap was tagged with  two #2 Ethibond sutures and retracted protecting the sciatic nerve. This exposed the arthritic femoral head and osteophytes. The hip was then flexed and internally rotated, dislocating the femoral head and a standard neck cut performed 1 fingerbreadth above the lesser trochanter.  A spiked Cobra was placed in the cotyloid notch and a Hohmann retractor was then used to lever the femur anteriorly off of the anterior pelvic column. A posterior-inferior wing retractor was placed at the junction of the acetabulum and the ischium completing the acetabular exposure.We then removed the peripheral osteophytes and labrum from the acetabulum. We then reamed the acetabulum up to 49 mm with basket reamers obtaining good coverage in all quadrants. We then irrigated with normal  saline solution and hammered into place a 50 mm pinnacle  cup in 45  degrees of abduction and about 20 degrees of anteversion. More  peripheral osteophytes removed and a trial 10-degree liner placed with the  index superior. The hip was then flexed and internally rotated exposing the  proximal femur, which was entered with the initiating reamer followed by  the axial reamers up to a 13.5 mm full depth and 51mm partial depth. We then conically reamed to 18B to the correct depth for a 42 base neck. The calcar was milled to 18Bsm. A trial cone and stem was inserted in the 25 degrees anteversion, with a +0 21mm trial head. Trial reduction was then performed and excellent stability was noted with at 90 of flexion with 70 of internal rotation and then full extension with maximal external rotation. The hip could not be dislocated in full extension. The knee could easily flex  to about 130 degrees. We also stretched the abductors at this point,  because of the preexisting adductor contractures. All trial components  were then removed. The acetabulum was irrigated out with normal saline  solution. A titanium Apex [facility] was then screwed into place  followed by a 10-degree polyethylene liner index superior-posterior. On  the femoral side a 18Bsm  ZTT1 sleeve was hammered into place, followed by a 18x13x150x42 SROM stem in 25 degrees of anteversion. At this point, a +0 36 mm ceramic head was  hammered on the stem. The hip was reduced. We checked our stability  one more time and found it to be excellent. The wound was once again  thoroughly irrigated out with normal saline solution pulse lavage. The  capsular flap and short external rotators were repaired back to the  intertrochanteric crest through drill holes with a #2 Ethibond suture.  The IT band was closed with running 1 Vicryl suture. The subcutaneous  tissue with 0 and 2-0 undyed Vicryl suture and the skin with running  interlocking 3-0 nylon suture. Dressing of Xeroform and Mepilex was  then  applied. The patient was then unclamped, rolled supine, awaken extubated and taken to recovery room without difficulty in stable condition.   Madison Stein 08/30/2014, 9:15 AM

## 2014-08-30 NOTE — Progress Notes (Signed)
[facility]  Patient Status: New  [facility] is providing the following services: PT ( patient setup with [facility] prior to surgery)   If patient discharges after hours, please call [phone].   Consepcion Hearing 08/30/2014, 3:25 PM

## 2014-08-30 NOTE — Transfer of Care (Signed)
Immediate Anesthesia Transfer of Care Note  Patient: Madison Stein  Procedure(s) Performed: Procedure(s): TOTAL HIP ARTHROPLASTY (Left)  Patient Location: PACU  Anesthesia Type:General  Level of Consciousness: awake, alert , oriented and sedated  Airway & Oxygen Therapy: Patient Spontanous Breathing and Patient connected to nasal cannula oxygen  Post-op Assessment: Report given to RN, Post -op Vital signs reviewed and stable and Patient moving all extremities  Post vital signs: Reviewed and stable  Last Vitals:  Filed Vitals:   08/30/14 0608  BP: 140/76  Pulse: 93  Temp: 36.9 C  Resp: 18    Complications: No apparent anesthesia complications

## 2014-08-30 NOTE — Interval H&P Note (Signed)
History and Physical Interval Note:  08/30/2014 7:13 AM  Madison Stein  has presented today for surgery, with the diagnosis of LEFT HIP OSTEOARTHRITIS   The various methods of treatment have been discussed with the patient and family. After consideration of risks, benefits and other options for treatment, the patient has consented to  Procedure(s): TOTAL HIP ARTHROPLASTY (Left) as a surgical intervention .  The patient's history has been reviewed, patient examined, no change in status, stable for surgery.  I have reviewed the patient's chart and labs.  Questions were answered to the patient's satisfaction.     Kerin Salen

## 2014-08-30 NOTE — Progress Notes (Signed)
UR complete.  Taylynn Easton RN, MSN 

## 2014-08-31 ENCOUNTER — Encounter (HOSPITAL_COMMUNITY): Payer: Self-pay | Admitting: Orthopedic Surgery

## 2014-08-31 LAB — BASIC METABOLIC PANEL
Anion gap: 6 (ref 5–15)
BUN: 12 mg/dL (ref 6–23)
CALCIUM: 8.1 mg/dL — AB (ref 8.4–10.5)
CHLORIDE: 105 mmol/L (ref 96–112)
CO2: 24 mmol/L (ref 19–32)
Creatinine, Ser: 1.02 mg/dL (ref 0.50–1.10)
GFR calc Af Amer: 67 mL/min — ABNORMAL LOW (ref >90)
GFR, EST NON AFRICAN AMERICAN: 58 mL/min — AB (ref >90)
GLUCOSE: 168 mg/dL — AB (ref 70–99)
Potassium: 4.4 mmol/L (ref 3.5–5.1)
Sodium: 135 mmol/L (ref 135–145)

## 2014-08-31 LAB — CBC
HCT: 25.8 % — ABNORMAL LOW (ref 36.0–46.0)
Hemoglobin: 8.2 g/dL — ABNORMAL LOW (ref 12.0–15.0)
MCH: 26 pg (ref 26.0–34.0)
MCHC: 31.8 g/dL (ref 30.0–36.0)
MCV: 81.9 fL (ref 78.0–100.0)
Platelets: 165 10*3/uL (ref 150–400)
RBC: 3.15 MIL/uL — ABNORMAL LOW (ref 3.87–5.11)
RDW: 14.6 % (ref 11.5–15.5)
WBC: 7 10*3/uL (ref 4.0–10.5)

## 2014-08-31 LAB — GLUCOSE, CAPILLARY
GLUCOSE-CAPILLARY: 165 mg/dL — AB (ref 70–99)
GLUCOSE-CAPILLARY: 207 mg/dL — AB (ref 70–99)

## 2014-08-31 LAB — HEMOGLOBIN A1C
Hgb A1c MFr Bld: 7.2 % — ABNORMAL HIGH (ref 4.8–5.6)
Mean Plasma Glucose: 160 mg/dL

## 2014-08-31 MED ORDER — MAGIC MOUTHWASH
15.0000 mL | Freq: Four times a day (QID) | ORAL | Status: DC | PRN
  Administered 2014-08-31: 15 mL via ORAL
  Filled 2014-08-31 (×2): qty 15

## 2014-08-31 NOTE — Discharge Summary (Signed)
Patient ID: Madison Stein MRN: [id number] DOB/AGE: August 20, 1950 64 y.o.  Admit date: 08/30/2014 Discharge date: 08/31/2014  Admission Diagnoses:  Principal Problem:   Primary osteoarthritis of left hip Active Problems:   Arthritis, hip   Discharge Diagnoses:  Same  Past Medical History  Diagnosis Date  . Diabetes mellitus without complication     takes Metformin daily  . Depression     takes Cymbalta daily  . Hyperlipidemia     takes Fish Oil bid  . History of bronchitis 85yrs ago  . Arthritis   . Joint pain   . Thyroid nodule   . Insomnia     has Ambien if needed  . History of shingles   . Complication of anesthesia   . PONV (postoperative nausea and vomiting)   . Family history of anesthesia complication     mom and sister get very sick  . Pneumonia 13yrs ago    hx of  . Fibromyalgia   . Peripheral neuropathy     takes Lyrica daily    Surgeries: Procedure(s): TOTAL HIP ARTHROPLASTY on 08/30/2014   Consultants:    Discharged Condition: Improved  Hospital Course: Madison Stein is an 64 y.o. female who was admitted 08/30/2014 for operative treatment ofPrimary osteoarthritis of left hip. Patient has severe unremitting pain that affects sleep, daily activities, and work/hobbies. After pre-op clearance the patient was taken to the operating room on 08/30/2014 and underwent  Procedure(s): TOTAL HIP ARTHROPLASTY.    Patient was given perioperative antibiotics: Anti-infectives    Start     Dose/Rate Route Frequency Ordered Stop   08/30/14 0715  ceFAZolin (ANCEF) IVPB 2 g/50 mL premix     2 g 100 mL/hr over 30 Minutes Intravenous  Once 08/30/14 0714 08/30/14 0737       Patient was given sequential compression devices, early ambulation, and chemoprophylaxis to prevent DVT.  Patient benefited maximally from hospital stay and there were no complications.    Recent vital signs: Patient Vitals for the past 24 hrs:  BP Temp Temp src Pulse Resp SpO2  08/31/14 0800 - - - -  16 100 %  08/31/14 0533 (!) 90/38 mmHg - - 85 - 100 %  08/31/14 0532 (!) 92/48 mmHg 98.2 F (36.8 C) Oral 81 - 100 %  08/30/14 2000 - - - - 18 98 %  08/30/14 1928 (!) 112/51 mmHg 98.3 F (36.8 C) Oral (!) 108 - 97 %     Recent laboratory studies:  Recent Labs  08/31/14 0648  WBC 7.0  HGB 8.2*  HCT 25.8*  PLT 165  NA 135  K 4.4  CL 105  CO2 24  BUN 12  CREATININE 1.02  GLUCOSE 168*  CALCIUM 8.1*     Discharge Medications:     Medication List    STOP taking these medications        HYDROcodone-acetaminophen 5-325 MG per tablet  Commonly known as:  NORCO/VICODIN      TAKE these medications        ARTHROTEC 75-0.2 MG Tbec  Generic drug:  Diclofenac-Misoprostol  Take 1 tablet by mouth 2 (two) times daily.     aspirin EC 325 MG tablet  Take 1 tablet (325 mg total) by mouth 2 (two) times daily.     DULoxetine 60 MG capsule  Commonly known as:  CYMBALTA  Take 60 mg by mouth 2 (two) times daily.     metFORMIN 1000 MG tablet  Commonly known as:  GLUCOPHAGE  Take 1,000 mg by mouth 2 (two) times daily with a meal.     methocarbamol 500 MG tablet  Commonly known as:  ROBAXIN  Take 1 tablet (500 mg total) by mouth 2 (two) times daily with a meal.     multivitamin with minerals Tabs tablet  Take 1 tablet by mouth at bedtime.     omega-3 acid ethyl esters 1 G capsule  Commonly known as:  LOVAZA  Take 1 g by mouth 2 (two) times daily.     oxyCODONE-acetaminophen 5-325 MG per tablet  Commonly known as:  ROXICET  Take 1 tablet by mouth every 4 (four) hours as needed.     pregabalin 150 MG capsule  Commonly known as:  LYRICA  Take 150 mg by mouth daily.        Diagnostic Studies: Dg Pelvis Portable  08/30/2014   CLINICAL DATA:  Left hip replacement.  EXAM: PORTABLE PELVIS 1-2 VIEWS  COMPARISON:  11/20/2013  FINDINGS: Single view demonstrates a left hip arthroplasty. The entire left femoral stem is visualized. No evidence for a periprosthetic fracture or  hardware complication. Again noted is the right hip arthroplasty without complicating features. The pelvic bony ring is intact.  IMPRESSION: Left hip arthroplasty.   Electronically Signed   By: Markus Daft M.D.   On: 08/30/2014 11:21    Disposition: 06-Home-Health Care Svc      Discharge Instructions    Call MD / Call 911    Complete by:  As directed   If you experience chest pain or shortness of breath, CALL 911 and be transported to the hospital emergency room.  If you develope a fever above 101 F, pus (white drainage) or increased drainage or redness at the wound, or calf pain, call your surgeon's office.     Change dressing    Complete by:  As directed   You may change your dressing on day 5, then change the dressing daily with sterile 4 x 4 inch gauze dressing and paper tape.  You may clean the incision with alcohol prior to redressing     Constipation Prevention    Complete by:  As directed   Drink plenty of fluids.  Prune juice may be helpful.  You may use a stool softener, such as Colace (over the counter) 100 mg twice a day.  Use MiraLax (over the counter) for constipation as needed.     Diet - low sodium heart healthy    Complete by:  As directed      Discharge instructions    Complete by:  As directed   Follow up in office with Dr. Mayer Camel in 2 weeks.     Driving restrictions    Complete by:  As directed   No driving for 2 weeks     Follow the hip precautions as taught in Physical Therapy    Complete by:  As directed      Increase activity slowly as tolerated    Complete by:  As directed      Patient may shower    Complete by:  As directed   You may shower without a dressing once there is no drainage.  Do not wash over the wound.  If drainage remains, cover wound with plastic wrap and then shower.           Follow-up Information    Follow up with Kerin Salen, MD In 2 weeks.   Specialty:  Orthopedic Surgery   Contact information:  [place]  [phone]        Signed: Theodosia Quay 08/31/2014, 12:47 PM

## 2014-08-31 NOTE — Evaluation (Signed)
Occupational Therapy Evaluation Patient Details Name: Madison Stein MRN: [id number] DOB: 1951-03-21 Today's Date: 08/31/2014    History of Present Illness Pt admitted 2/22 for post Left THA. 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   Clinical Impression   Patient admitted with above. Patient mod I PTA. Patient currently functioning at an overall mod I level. D/C from acute OT services and no additional follow-up OT needs at this time. All appropriate education provided to patient. Please re-order OT as needed.      Follow Up Recommendations  No OT follow up;Supervision - Intermittent    Equipment Recommendations  None recommended by OT    Recommendations for Other Services  None at this time     Precautions / Restrictions Precautions Precautions: Posterior Hip Precaution Booklet Issued: Yes (comment) Precaution Comments: pt with good recall from previous surgery, able to recall 3/3 precautions Restrictions Weight Bearing Restrictions: Yes LLE Weight Bearing: Weight bearing as tolerated      Mobility  Bed Mobility - Mod I General bed mobility comments: using bed rails and HOB elevated   Transfers Overall transfer level: Modified independent Equipment used: Rolling walker (2 wheeled) Transfers: Sit to/from Stand Sit to Stand: Min guard   General transfer comment: No cues needed for hand placement or safety    Balance  Overall balance assessment: No apparent balance deficits (not formally assessed)     ADL Overall ADL's : Modified independent General ADL Comments: Patient overall mod I for all mobility and is able to use AE for LB ADLs. Patient able to verbalize and demonstrate understanding for ADL and functional tasks. Patient's sister will be home with her until March 9th.     Pertinent Vitals/Pain Pain Assessment: 0-10 Pain Score: 4  Pain Location: Lt hip Pain Descriptors / Indicators: Aching     Hand Dominance Left   Extremity/Trunk Assessment Upper Extremity  Assessment Upper Extremity Assessment: Generalized weakness (BUE exercises taught)   Lower Extremity Assessment Lower Extremity Assessment: LLE deficits/detail LLE Deficits / Details: pt able to initiate active L hip flexion and complete LAQ   Cervical / Trunk Assessment Cervical / Trunk Assessment: Normal   Communication Communication Communication: No difficulties   Cognition Arousal/Alertness: Awake/alert Behavior During Therapy: WFL for tasks assessed/performed Overall Cognitive Status: Within Functional Limits for tasks assessed              Home Living Family/patient expects to be discharged to:: Private residence Living Arrangements: Alone (but sister coming to stay with her) Available Help at Discharge: Family;Available 24 hours/day Type of Home: House Home Access: Stairs to enter [employer] of Steps: 2 Entrance Stairs-Rails: None Home Layout: One level     Bathroom Shower/Tub: Walk-in Hydrologist: Standard     Home Equipment: Shower seat - built in;Cane - single point;Bedside commode;Walker - 2 wheels;Shower seat          Prior Functioning/Environment Level of Independence: Independent with assistive device(s)        Comments: cane for ambulation PTA    OT Diagnosis:  n/a, no acute OT needs   OT Problem List:  n/a, no acute OT needs   OT Treatment/Interventions:   n/a, no acute OT needs   OT Goals(Current goals can be found in the care plan section) Acute Rehab OT Goals Patient Stated Goal: go home  OT Frequency:     Barriers to D/C:  None known at this time    End of Session Equipment Utilized During Treatment: Rolling  walker  Activity Tolerance: Patient tolerated treatment well Patient left: in bed;with call bell/phone within reach   Time: 1024-1037 OT Time Calculation (min): 13 min Charges:  OT General Charges $OT Visit: 1 Procedure OT Evaluation $Initial OT Evaluation Tier I: 1  Procedure  Mariene Dickerman , MS, OTR/L, CLT Pager: [phone]  08/31/2014, 10:49 AM

## 2014-08-31 NOTE — Progress Notes (Signed)
Patient ID: Madison Stein, female   DOB: January 24, 1951, 64 y.o.   MRN: [id number] PATIENT ID: Madison Stein  MRN: [id number]  DOB/AGE:  1950-10-26 / 64 y.o.  1 Day Post-Op Procedure(s) (LRB): TOTAL HIP ARTHROPLASTY (Left)    PROGRESS NOTE Subjective: Patient is alert, oriented, no Nausea, no Vomiting, yes passing gas, no Bowel Movement. Taking PO well. Denies SOB, Chest or Calf Pain. Using Incentive Spirometer, PAS in place. Ambulate WBAT Patient reports pain as 3 on 0-10 scale  .    Objective: Vital signs in last 24 hours: Filed Vitals:   08/30/14 1928 08/30/14 2000 08/31/14 0532 08/31/14 0533  BP: 112/51  92/48 90/38  Pulse: 108  81 85  Temp: 98.3 F (36.8 C)  98.2 F (36.8 C)   TempSrc: Oral  Oral   Resp:  18    Height:      Weight:      SpO2: 97% 98% 100% 100%      Intake/Output from previous day: I/O last 3 completed shifts: In: 6269 [P.O.:120; I.V.:3000; IV Piggyback:250] Out: 850 [Urine:450; Blood:400]   Intake/Output this shift:     LABORATORY DATA:  Recent Labs  08/30/14 1859 08/30/14 2139 08/31/14 0625  GLUCAP 307* 168* 165*    Examination: Neurologically intact ABD soft Neurovascular intact Sensation intact distally Intact pulses distally Dorsiflexion/Plantar flexion intact Incision: scant drainage No cellulitis present Compartment soft} XR AP&Lat of hip shows well placed\fixed THA  Assessment:   1 Day Post-Op Procedure(s) (LRB): TOTAL HIP ARTHROPLASTY (Left) ADDITIONAL DIAGNOSIS:  Expected Acute Blood Loss Anemia, depression  Plan: PT/OT WBAT, THA  posterior precautions  DVT Prophylaxis: SCDx72 hrs, ASA 325 mg BID x 2 weeks  DISCHARGE PLAN: Home, when passes PT  DISCHARGE NEEDS: HHPT, HHRN, CPM, Walker and 3-in-1 comode seat

## 2014-08-31 NOTE — Care Management Note (Signed)
    Page 1 of 2   08/31/2014     2:32:53 PM CARE MANAGEMENT NOTE 08/31/2014  Patient:  Madison Stein, Madison Stein   Account Number:  [id number]  Date Initiated:  08/30/2014  Documentation initiated by:  Lorne Skeens  Subjective/Objective Assessment:   Patient was admitted for TOTAL HIP ARTHROPLASTY (Left). Lives home alone.     Action/Plan:   Will follow for discharge needs   Anticipated DC Date:  08/31/2014   Anticipated DC Plan:  [place]  CM consult      Choice offered to / List presented to:  C-1 Patient   DME arranged  [place]      DME agency  [facility] arranged  [place].   Status of service:  In process, will continue to follow Medicare Important Message given?   (If response is "NO", the following Medicare IM given date fields will be blank) Date Medicare IM given:   Medicare IM given by:   Date Additional Medicare IM given:   Additional Medicare IM given by:    Discharge Disposition:  [place]  Per UR Regulation:  Reviewed for med. necessity/level of care/duration of stay  If discussed at [place] of Stay Meetings, dates discussed:    Comments:  08/31/14 Shepherd, MSN, CM- Spoke with Joanell Rising PA regarding discharge planning.  Patient has [facility] orders, but therapy is recommending outpatient therapy. Per Randall Hiss, patient should be discharged home with [facility] therapy until she is seen at her follow-up appointment.  Miranda with [facility] was notified of plans to discharge home today.  Patient did not originally think that she needed the recommended walker because she had one available to her at home.  Patient was unable to locate the walker, so [facility] was notified of need for walker prior to discharge home today.

## 2014-08-31 NOTE — Progress Notes (Signed)
Physical Therapy Treatment Note  Clinical Impression: Pt con't to progress extremely well with minimal pain.     08/31/14 1220  PT Visit Information  Last PT Received On 08/31/14  Assistance Needed +1  History of Present Illness Pt admitted 2/22 for post Left THA. 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  PT Time Calculation  PT Start Time (ACUTE ONLY) 1220  PT Stop Time (ACUTE ONLY) 1237  PT Time Calculation (min) (ACUTE ONLY) 17 min  Subjective Data  Subjective "I feel great"  Patient Stated Goal go home tomorrow  Precautions  Precautions Posterior Hip  Precaution Booklet Issued Yes (comment)  Precaution Comments pt with good recall but required v/c's to adhere during bed mobility  Restrictions  Weight Bearing Restrictions Yes  LLE Weight Bearing WBAT  Pain Assessment  Pain Assessment 0-10  Pain Score 2  Pain Location lt hip   Pain Descriptors / Indicators Aching  Cognition  Arousal/Alertness Awake/alert  Behavior During Therapy WFL for tasks assessed/performed  Overall Cognitive Status Within Functional Limits for tasks assessed  Bed Mobility  Overal bed mobility Modified Independent  General bed mobility comments v/c's for long sit technique to mimic home set up, v/c's to not internally rotate L LE  Transfers  Overall transfer level Modified independent  Equipment used Rolling walker (2 wheeled)  Transfers Sit to/from Stand  Sit to Stand Supervision  General transfer comment No cues needed for hand placement or safety  Ambulation/Gait  Ambulation/Gait assistance Supervision  Ambulation Distance (Feet) 500 Feet  Assistive device Rolling walker (2 wheeled)  Gait Pattern/deviations Step-through pattern  General Gait Details mild antalgia  Stairs Yes  Stairs assistance Min guard  Stair Management Backwards;No rails;With walker  Number of Stairs 2  General stair comments pt with good sequencing of "up with the good,downwith the bad"  Balance  Overall balance assessment No apparent  balance deficits (not formally assessed)  Exercises  Exercises Total Joint  Total Joint Exercises  Hip ABduction/ADduction AROM;Left;20 reps;Standing  Marching in Standing AROM;Left;20 reps  Standing Hip Extension AROM;Left;20 reps  PT - End of Session  Equipment Utilized During Treatment Gait belt  Activity Tolerance Patient tolerated treatment well  Patient left in chair;with call bell/phone within reach  Nurse Communication Mobility status  PT - Assessment/Plan  PT Plan Current plan remains appropriate  PT Frequency (ACUTE ONLY) 7X/week  Follow Up Recommendations Outpatient PT;Supervision - Intermittent  PT equipment None recommended by PT  PT Goal Progression  Progress towards PT goals Progressing toward goals  PT General Charges  $$ ACUTE PT VISIT 1 Procedure  PT Treatments  $Gait Training 8-22 mins   Kittie Plater, PT, DPT Pager #: [phone] Office #: [phone]

## 2014-08-31 NOTE — Evaluation (Signed)
Physical Therapy Evaluation Patient Details Name: Madison Stein MRN: [id number] DOB: Nov 06, 1950 Today's Date: 08/31/2014   History of Present Illness  Pt admitted 2/22 for post Left THA. 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  Clinical Impression  Pt is s/p THA resulting in the deficits listed below (see PT Problem List). Pt progressing extremely well with minimal pain. Pt with good knowledge due to having had R THA done last year. Pt safe to d/c home as soon as today if MD clears pt medically.  Pt will benefit from skilled PT to increase their independence and safety with mobility to allow discharge to the venue listed below.      Follow Up Recommendations Outpatient PT;Supervision - Intermittent    Equipment Recommendations  None recommended by PT    Recommendations for Other Services       Precautions / Restrictions Precautions Precautions: Posterior Hip Precaution Booklet Issued: Yes (comment) Precaution Comments: pt with good recall from previous surgery Restrictions Weight Bearing Restrictions: Yes LLE Weight Bearing: Weight bearing as tolerated      Mobility  Bed Mobility               General bed mobility comments: pt up in chair upon PT arrival  Transfers Overall transfer level: Needs assistance Equipment used: Rolling walker (2 wheeled) Transfers: Sit to/from Stand Sit to Stand: Min guard         General transfer comment: v/c's for safe hand placement  Ambulation/Gait Ambulation/Gait assistance: Min guard Ambulation Distance (Feet): 500 Feet Assistive device: Rolling walker (2 wheeled) Gait Pattern/deviations: Step-through pattern     General Gait Details: mild antalgia  Stairs            Wheelchair Mobility    Modified Rankin (Stroke Patients Only)       Balance Overall balance assessment: Needs assistance           Standing balance-Leahy Scale: Fair Standing balance comment: needs walker for amb                              Pertinent Vitals/Pain Pain Assessment: 0-10 Pain Score: 4  Pain Location: Lt hip Pain Descriptors / Indicators: Aching    Home Living Family/patient expects to be discharged to:: Private residence Living Arrangements: Alone (but sister coming to stay with her) Available Help at Discharge: Family;Available 24 hours/day Type of Home: House Home Access: Stairs to enter Entrance Stairs-Rails: None Entrance Stairs-Number of Steps: 2 Home Layout: One level Home Equipment: Shower seat - built in;Cane - single point;Bedside commode;Walker - 2 wheels      Prior Function Level of Independence: Independent with assistive device(s)               Hand Dominance   Dominant Hand: Left    Extremity/Trunk Assessment   Upper Extremity Assessment: Generalized weakness           Lower Extremity Assessment: LLE deficits/detail   LLE Deficits / Details: pt able to initiate active L hip flexion and complete LAQ  Cervical / Trunk Assessment: Normal  Communication   Communication: No difficulties  Cognition Arousal/Alertness: Awake/alert Behavior During Therapy: WFL for tasks assessed/performed Overall Cognitive Status: Within Functional Limits for tasks assessed                      General Comments      Exercises Total Joint Exercises Ankle Circles/Pumps: AROM;Both;5 reps Quad Sets: AROM;Left;5 reps  Gluteal Sets: AROM;Both;5 reps Long Arc Quad: AROM;Left;10 reps      Assessment/Plan    PT Assessment Patient needs continued PT services  PT Diagnosis Difficulty walking;Acute pain   PT Problem List Decreased strength;Decreased range of motion;Decreased activity tolerance;Decreased balance;Decreased mobility  PT Treatment Interventions DME instruction;Gait training;Stair training;Functional mobility training;Therapeutic activities;Therapeutic exercise   PT Goals (Current goals can be found in the Care Plan section) Acute Rehab PT Goals Patient Stated Goal:  home PT Goal Formulation: With patient Time For Goal Achievement: 09/07/14 Potential to Achieve Goals: Good    Frequency 7X/week   Barriers to discharge        Co-evaluation               End of Session Equipment Utilized During Treatment: Gait belt Activity Tolerance: Patient tolerated treatment well Patient left: in chair;with call bell/phone within reach Nurse Communication: Mobility status         Time: 4103-0131 PT Time Calculation (min) (ACUTE ONLY): 24 min   Charges:   PT Evaluation $Initial PT Evaluation Tier I: 1 Procedure PT Treatments $Gait Training: 8-22 mins   PT G CodesKingsley Callander 08/31/2014, 10:23 AM   Kittie Plater, PT, DPT Pager #: [phone] Office #: [phone]

## 2015-04-26 ENCOUNTER — Other Ambulatory Visit: Payer: Self-pay | Admitting: Orthopedic Surgery

## 2015-05-20 ENCOUNTER — Other Ambulatory Visit (HOSPITAL_COMMUNITY): Payer: Self-pay

## 2015-05-25 ENCOUNTER — Ambulatory Visit (HOSPITAL_COMMUNITY): Admission: RE | Admit: 2015-05-25 | Payer: 59 | Source: Ambulatory Visit

## 2015-05-25 ENCOUNTER — Other Ambulatory Visit: Payer: Self-pay | Admitting: Orthopedic Surgery

## 2015-05-25 ENCOUNTER — Encounter (HOSPITAL_COMMUNITY): Payer: Self-pay

## 2015-05-25 ENCOUNTER — Encounter (HOSPITAL_COMMUNITY): Admission: RE | Admit: 2015-05-25 | Payer: Self-pay | Source: Ambulatory Visit

## 2015-05-25 ENCOUNTER — Inpatient Hospital Stay (HOSPITAL_COMMUNITY): Admission: RE | Admit: 2015-05-25 | Payer: 59 | Source: Ambulatory Visit

## 2015-05-25 ENCOUNTER — Encounter (HOSPITAL_COMMUNITY)
Admission: RE | Admit: 2015-05-25 | Discharge: 2015-05-25 | Disposition: A | Discharge status: Home / Self Care | Payer: 59 | Source: Ambulatory Visit | Attending: Orthopedic Surgery | Admitting: Orthopedic Surgery

## 2015-05-25 ENCOUNTER — Ambulatory Visit (HOSPITAL_COMMUNITY)
Admission: RE | Admit: 2015-05-25 | Discharge: 2015-05-25 | Disposition: A | Discharge status: Home / Self Care | Payer: 59 | Source: Ambulatory Visit | Attending: Orthopedic Surgery | Admitting: Orthopedic Surgery

## 2015-05-25 DIAGNOSIS — M1712 Unilateral primary osteoarthritis, left knee: Secondary | ICD-10-CM | POA: Diagnosis not present

## 2015-05-25 DIAGNOSIS — Z01810 Encounter for preprocedural cardiovascular examination: Secondary | ICD-10-CM | POA: Insufficient documentation

## 2015-05-25 DIAGNOSIS — Z01812 Encounter for preprocedural laboratory examination: Secondary | ICD-10-CM | POA: Insufficient documentation

## 2015-05-25 HISTORY — DX: Urgency of urination: R39.15

## 2015-05-25 HISTORY — DX: Bronchitis, not specified as acute or chronic: J40

## 2015-05-25 HISTORY — DX: Family history of other specified conditions: Z84.89

## 2015-05-25 LAB — DIFFERENTIAL
BASOS PCT: 1 %
Basophils Absolute: 0.1 10*3/uL (ref 0.0–0.1)
Eosinophils Absolute: 0.3 10*3/uL (ref 0.0–0.7)
Eosinophils Relative: 3 %
LYMPHS PCT: 34 %
Lymphs Abs: 3 10*3/uL (ref 0.7–4.0)
MONO ABS: 0.7 10*3/uL (ref 0.1–1.0)
Monocytes Relative: 8 %
NEUTROS PCT: 54 %
Neutro Abs: 4.8 10*3/uL (ref 1.7–7.7)

## 2015-05-25 LAB — BASIC METABOLIC PANEL
Anion gap: 10 (ref 5–15)
BUN: 18 mg/dL (ref 6–20)
CO2: 24 mmol/L (ref 22–32)
CREATININE: 0.85 mg/dL (ref 0.44–1.00)
Calcium: 9.9 mg/dL (ref 8.9–10.3)
Chloride: 104 mmol/L (ref 101–111)
Glucose, Bld: 123 mg/dL — ABNORMAL HIGH (ref 65–99)
Potassium: 4.9 mmol/L (ref 3.5–5.1)
SODIUM: 138 mmol/L (ref 135–145)

## 2015-05-25 LAB — URINALYSIS, ROUTINE W REFLEX MICROSCOPIC
Bilirubin Urine: NEGATIVE
Glucose, UA: >1000 mg/dL — AB
Hgb urine dipstick: NEGATIVE
Ketones, ur: NEGATIVE mg/dL
LEUKOCYTES UA: NEGATIVE
NITRITE: NEGATIVE
PH: 5 (ref 5.0–8.0)
Protein, ur: NEGATIVE mg/dL
SPECIFIC GRAVITY, URINE: 1.027 (ref 1.005–1.030)

## 2015-05-25 LAB — URINE MICROSCOPIC-ADD ON: RBC / HPF: NONE SEEN RBC/hpf (ref 0–5)

## 2015-05-25 LAB — SURGICAL PCR SCREEN
MRSA, PCR: NEGATIVE
STAPHYLOCOCCUS AUREUS: POSITIVE — AB

## 2015-05-25 LAB — TYPE AND SCREEN
ABO/RH(D): O POS
Antibody Screen: NEGATIVE

## 2015-05-25 LAB — PROTIME-INR
INR: 0.99 (ref 0.00–1.49)
PROTHROMBIN TIME: 13.3 s (ref 11.6–15.2)

## 2015-05-25 LAB — APTT: aPTT: 31 seconds (ref 24–37)

## 2015-05-25 NOTE — Progress Notes (Signed)
Patient arrived to PAT and Nurse noticed that patient was scheduled for PAT appointment for a 1200 and 1220 appointment. Nurse was able to document under the 1200 PAT appointment with the MRN number ending in 438, however the 1220 appointment with MRN number ending in 493 was canceled. Nurse called admissions and inquired about patient having two accounts. Tyrone in admissions informed Nurse that the patients social security number was entered wrong in the computer by someone at the doctor's office which was why the patient had two different accounts. New labels were printed by admission staff. Nurse labeled all information with new labels ending in MRN # [id number] (even though surgery was canceled under that account) because the MRN # ending in 438 (the account I was able to document under) does not have the patients correct social security number.   When Nurse released ordered lab work, the labels printed off with MRN number ending in 438 and labs can not be used under that account because the wrong social security number was entered. The correct MRN # needs to be [id number] Account # [id number] NOT MRN # [id number] Account # [id number]. Therefore lab techs are holding lab work in lab until this is sorted out. Will call Sandi Raveling and inform her of this.

## 2015-05-25 NOTE — Progress Notes (Signed)
Nurse called and left a voicemail with Sandi Raveling at Dr. Damita Dunnings office informing her of the issues with the patients account. Direct call back number left.

## 2015-05-25 NOTE — Pre-Procedure Instructions (Signed)
Madison Stein  05/25/2015     Your procedure is scheduled on : Monday June 06, 2015 at 10:15 AM.  Report to [facility] Admitting at 8:15 AM.  Call this number if you have problems the morning of surgery: [phone]    Remember:  Do not eat food or drink liquids after midnight.  Take these medicines the morning of surgery with A SIP OF WATER : Duloxetine (Cymbalta), Hydrocodone, Pregabalin (Lyrica)   Stop taking any vitamins, herbal medications, Ibuprofen, Advil, Motrin, Aleve, Arthrotec, etc on Monday November 21st   Do NOT take any diabetic pills the morning of your surgery (NO Xigduo)   How to Manage Your Diabetes Before Surgery   Why is it important to control my blood sugar before and after surgery?   Improving blood sugar levels before and after surgery helps healing and can limit problems.  A way of improving blood sugar control is eating a healthy diet by:  - Eating less sugar and carbohydrates  - Increasing activity/exercise  - Talk with your doctor about reaching your blood sugar goals  High blood sugars (greater than 180 mg/dL) can raise your risk of infections and slow down your recovery so you will need to focus on controlling your diabetes during the weeks before surgery.  Make sure that the doctor who takes care of your diabetes knows about your planned surgery including the date and location.  How do I manage my blood sugars before surgery?   Check your blood sugar at least 4 times a day, 2 days before surgery to make sure that they are not too high or low.   Check your blood sugar the morning of your surgery when you wake up and every 2 hours until you get to the Short-Stay unit.  If your blood sugar is less than 70 mg/dL, you will need to treat for low blood sugar by:  Treat a low blood sugar (less than 70 mg/dL) with 1/2 cup of clear juice (cranberry or apple), 4 glucose tablets, OR glucose gel.  Recheck blood sugar in 15 minutes after  treatment (to make sure it is greater than 70 mg/dL).  If blood sugar is not greater than 70 mg/dL on re-check, call [phone] for further instructions.   Report your blood sugar to the Short-Stay nurse when you get to Short-Stay.  References:  [facility], 2007 "How to Manage your Diabetes Before and After Surgery".  What do I do about my diabetes medications?   Do not take oral diabetes medicines (pills) the morning of surgery.   Do not wear jewelry, make-up or nail polish.  Do not wear lotions, powders, or perfumes.    Do not shave 48 hours prior to surgery.    Do not bring valuables to the hospital.  [facility] is not responsible for any belongings or valuables.  Contacts, dentures or bridgework may not be worn into surgery.  Leave your suitcase in the car.  After surgery it may be brought to your room.  For patients admitted to the hospital, discharge time will be determined by your treatment team.  Patients discharged the day of surgery will not be allowed to drive home.   Name and phone number of your driver:    Special instructions:  Shower using CHG soap the night before and the morning of your surgery  Please read over the following fact sheets that you were given. Pain Booklet, Coughing and Deep Breathing, Blood Transfusion Information,  Total Joint Packet, MRSA Information and Surgical Site Infection Prevention

## 2015-05-25 NOTE — Progress Notes (Signed)
Nurse called Assistant Director Rolla Flatten, RN and informed her of patient having two account numbers. Pam called surgery scheduler and informed her of the situation as well. The surgery scheduler canceled both accounts and rescheduled patients surgery under there correct account number. Labs were reentered in [facility] as previously ordered by Dr. Mayer Camel.

## 2015-05-25 NOTE — Progress Notes (Signed)
PCP is Darrol Jump, PA-C in [place]  Patient denied having any acute cardiac or pulmonary issues  Nurse inquired about CBG and patient stated the highest her blood glucose has been was in the 150s-160s, and the lowest was in the 90s. Patient stated her last A1C was 6.5.

## 2015-05-26 ENCOUNTER — Encounter (HOSPITAL_COMMUNITY): Payer: Self-pay | Admitting: Orthopedic Surgery

## 2015-05-26 LAB — HEMOGLOBIN A1C
HEMOGLOBIN A1C: 7.3 % — AB (ref 4.8–5.6)
Mean Plasma Glucose: 163 mg/dL

## 2015-05-31 NOTE — H&P (Signed)
TOTAL KNEE ADMISSION H&P  Patient is being admitted for left total knee arthroplasty.  Subjective:  Chief Complaint:left knee pain.  HPI: Madison Stein, 64 y.o. female, has a history of pain and functional disability in the left knee due to arthritis and has failed non-surgical conservative treatments for greater than 12 weeks to includeNSAID's and/or analgesics, corticosteriod injections, flexibility and strengthening excercises, weight reduction as appropriate and activity modification.  Onset of symptoms was gradual, starting 2 years ago with gradually worsening course since that time. The patient noted no past surgery on the left knee(s).  Patient currently rates pain in the left knee(s) at 10 out of 10 with activity. Patient has night pain, worsening of pain with activity and weight bearing, pain that interferes with activities of daily living, pain with passive range of motion, crepitus and joint swelling.  Patient has evidence of subchondral sclerosis, periarticular osteophytes and joint space narrowing by imaging studies.  There is no active infection.  Patient Active Problem List   Diagnosis Date Noted  . Arthritis, hip 08/30/2014  . Primary osteoarthritis of left hip 08/29/2014  . Arthritis of right hip 11/20/2013   Past Medical History  Diagnosis Date  . Diabetes mellitus without complication ([place])     takes Metformin daily  . Depression     takes Cymbalta daily  . Hyperlipidemia     takes Fish Oil bid  . History of bronchitis 29yrs ago  . Joint pain   . Insomnia     has Ambien if needed  . Complication of anesthesia   . PONV (postoperative nausea and vomiting)   . Family history of anesthesia complication     mom and sister get very sick  . Pneumonia 90yrs ago    hx of  . Peripheral neuropathy ([facility])     takes Lyrica daily  . Family history of adverse reaction to anesthesia     patients mother and sister gets sick  . Pneumonia     hx of  . Depression   . Diabetes  mellitus without complication ([place])     Type II  . Hyperlipidemia   . Bronchitis 2013    Hx of  . Thyroid nodule   . History of shingles   . Arthritis   . Fibromyalgia   . Urgency of urination     Leakage wears pad    Past Surgical History  Procedure Laterality Date  . Carpal tunnel release Right 1985  . Back surgery  1989  . Ulnar nerve release Left 1996  . Cholecystectomy  2007  . Refractive surgery  2008  . Cervical fusion  2009  . 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 Total hip arthroplasty Right 11/20/2013    Procedure: RIGHT TOTAL HIP ARTHROPLASTY;  Surgeon: Kerin Salen, MD;  Location: [facility];  Service: Orthopedics;  Laterality: Right;  . Tubal ligation  1987  . Total hip arthroplasty Left 08/30/2014    Procedure: TOTAL HIP ARTHROPLASTY;  Surgeon: Kerin Salen, MD;  Location: [facility];  Service: Orthopedics;  Laterality: Left;  . Carpal tunnel release Right   . Ulnar nerve repair Left   . Back surgery    . Cervical fusion    . Colonoscopy    . Esophagogastroduodenoscopy    . Tumor removal Right     Hand  . Hip arthroplasty Bilateral   . Tubal ligation    . Cholecystectomy      No prescriptions prior to admission  Allergies  Allergen Reactions  . Morphine And Related Nausea And Vomiting  . Morphine And Related Other (See Comments)    "just don't tolerate it well"    Social History  Substance Use Topics  . Smoking status: Former Research scientist (life sciences)  . Smokeless tobacco: Not on file     Comment: quit smoking in 1997  . Alcohol Use: Yes     Comment: rare beer    No family history on file.   Review of Systems  Constitutional: Negative.   HENT: Negative.   Eyes: Negative.   Respiratory: Negative.   Cardiovascular: Negative.   Gastrointestinal: Negative.   Genitourinary: Negative.   Musculoskeletal: Positive for joint pain.  Skin: Negative.   Neurological: Negative.   Endo/Heme/Allergies: Negative.   Psychiatric/Behavioral: Negative.     Objective:  Physical  Exam  Constitutional: She is oriented to person, place, and time. She appears well-developed and well-nourished.  HENT:  Head: Normocephalic and atraumatic.  Eyes: Pupils are equal, round, and reactive to light.  Neck: Normal range of motion. Neck supple.  Cardiovascular: Intact distal pulses.   Respiratory: Effort normal.  Musculoskeletal: She exhibits tenderness.  she has no pain in the right hip with flexion extension internal/external rotation or log roll.  No pain in the left hip with again flexion extension internal/external rotation or log roll.  Mild pain laterally over the greater trochanteric bursal region.  Patient does have increased pain over the left knee medial joint line.  She also has mild peripatellar pain.  Obvious crepitus with range of motion.  She has a range from roughly 2-3 to 95-100.  No instability.  No noticeable effusion.  Her calves are soft and nontender.  She is neurovascularly intact distally.  Neurological: She is alert and oriented to person, place, and time.  Skin: Skin is warm and dry.  Psychiatric: She has a normal mood and affect. Her behavior is normal. Judgment and thought content normal.    Vital signs in last 24 hours:    Labs:   Estimated body mass index is 43.08 kg/(m^2) as calculated from the following:   Height as of 08/30/14: 5\' 3"  (1.6 m).   Weight as of 08/20/14: 110.281 kg (243 lb 2 oz).   Imaging Review Plain radiographs demonstrate AP, Rosenberg, lateral and sunrise x-rays of both knees show bone-on-bone medial compartment arthritic changes with early erosion  on the left greater than the right.  Mild subluxation of the left tibia lateral to the femur.  Assessment/Plan:  End stage arthritis, left knee   The patient history, physical examination, clinical judgment of the provider and imaging studies are consistent with end stage degenerative joint disease of the left knee(s) and total knee arthroplasty is deemed medically necessary.  The treatment options including medical management, injection therapy arthroscopy and arthroplasty were discussed at length. The risks and benefits of total knee arthroplasty were presented and reviewed. The risks due to aseptic loosening, infection, stiffness, patella tracking problems, thromboembolic complications and other imponderables were discussed. The patient acknowledged the explanation, agreed to proceed with the plan and consent was signed. Patient is being admitted for inpatient treatment for surgery, pain control, PT, OT, prophylactic antibiotics, VTE prophylaxis, progressive ambulation and ADL's and discharge planning. The patient is planning to be discharged home with home health services

## 2015-06-05 DIAGNOSIS — M1712 Unilateral primary osteoarthritis, left knee: Secondary | ICD-10-CM | POA: Diagnosis present

## 2015-06-05 MED ORDER — CEFAZOLIN SODIUM-DEXTROSE 2-3 GM-% IV SOLR
2.0000 g | INTRAVENOUS | Status: AC
  Administered 2015-06-06: 2 g via INTRAVENOUS
  Filled 2015-06-05: qty 50

## 2015-06-05 MED ORDER — CEFAZOLIN SODIUM-DEXTROSE 2-3 GM-% IV SOLR
2.0000 g | INTRAVENOUS | Status: DC
Start: 2015-06-06 — End: 2015-06-05

## 2015-06-06 ENCOUNTER — Inpatient Hospital Stay (HOSPITAL_COMMUNITY)
Admission: RE | Admit: 2015-06-06 | Discharge: 2015-06-08 | DRG: 470 | Disposition: A | Discharge status: Home Health | Payer: 59 | Source: Ambulatory Visit | Attending: Orthopedic Surgery | Admitting: Orthopedic Surgery

## 2015-06-06 ENCOUNTER — Encounter (HOSPITAL_COMMUNITY)
Admission: RE | Disposition: A | Discharge status: Home Health | Payer: Self-pay | Source: Ambulatory Visit | Attending: Orthopedic Surgery

## 2015-06-06 ENCOUNTER — Inpatient Hospital Stay: Admit: 2015-06-06 | Payer: 59 | Admitting: Orthopedic Surgery

## 2015-06-06 ENCOUNTER — Encounter (HOSPITAL_COMMUNITY): Payer: Self-pay | Admitting: *Deleted

## 2015-06-06 ENCOUNTER — Inpatient Hospital Stay (HOSPITAL_COMMUNITY): Payer: 59 | Admitting: Anesthesiology

## 2015-06-06 DIAGNOSIS — F329 Major depressive disorder, single episode, unspecified: Secondary | ICD-10-CM | POA: Diagnosis present

## 2015-06-06 DIAGNOSIS — E1142 Type 2 diabetes mellitus with diabetic polyneuropathy: Secondary | ICD-10-CM | POA: Diagnosis present

## 2015-06-06 DIAGNOSIS — Z96643 Presence of artificial hip joint, bilateral: Secondary | ICD-10-CM | POA: Diagnosis present

## 2015-06-06 DIAGNOSIS — Z6841 Body Mass Index (BMI) 40.0 and over, adult: Secondary | ICD-10-CM

## 2015-06-06 DIAGNOSIS — M1710 Unilateral primary osteoarthritis, unspecified knee: Secondary | ICD-10-CM | POA: Diagnosis present

## 2015-06-06 DIAGNOSIS — Z87891 Personal history of nicotine dependence: Secondary | ICD-10-CM | POA: Diagnosis not present

## 2015-06-06 DIAGNOSIS — Z885 Allergy status to narcotic agent status: Secondary | ICD-10-CM | POA: Diagnosis not present

## 2015-06-06 DIAGNOSIS — M1611 Unilateral primary osteoarthritis, right hip: Secondary | ICD-10-CM

## 2015-06-06 DIAGNOSIS — R3915 Urgency of urination: Secondary | ICD-10-CM | POA: Diagnosis present

## 2015-06-06 DIAGNOSIS — Z981 Arthrodesis status: Secondary | ICD-10-CM | POA: Diagnosis not present

## 2015-06-06 DIAGNOSIS — M797 Fibromyalgia: Secondary | ICD-10-CM | POA: Diagnosis present

## 2015-06-06 DIAGNOSIS — M1712 Unilateral primary osteoarthritis, left knee: Principal | ICD-10-CM | POA: Diagnosis present

## 2015-06-06 DIAGNOSIS — D62 Acute posthemorrhagic anemia: Secondary | ICD-10-CM | POA: Diagnosis not present

## 2015-06-06 DIAGNOSIS — E785 Hyperlipidemia, unspecified: Secondary | ICD-10-CM | POA: Diagnosis present

## 2015-06-06 DIAGNOSIS — G4700 Insomnia, unspecified: Secondary | ICD-10-CM | POA: Diagnosis present

## 2015-06-06 DIAGNOSIS — M25562 Pain in left knee: Secondary | ICD-10-CM | POA: Diagnosis present

## 2015-06-06 HISTORY — PX: TOTAL KNEE ARTHROPLASTY: SHX125

## 2015-06-06 LAB — GLUCOSE, CAPILLARY
GLUCOSE-CAPILLARY: 201 mg/dL — AB (ref 65–99)
Glucose-Capillary: 153 mg/dL — ABNORMAL HIGH (ref 65–99)
Glucose-Capillary: 131 mg/dL — ABNORMAL HIGH (ref 65–99)

## 2015-06-06 SURGERY — ARTHROPLASTY, KNEE, TOTAL
Anesthesia: General | Laterality: Left

## 2015-06-06 SURGERY — ARTHROPLASTY, KNEE, TOTAL
Anesthesia: Spinal | Laterality: Left

## 2015-06-06 MED ORDER — DEXTROSE-NACL 5-0.45 % IV SOLN: INTRAVENOUS | Status: DC

## 2015-06-06 MED ORDER — METHOCARBAMOL 1000 MG/10ML IJ SOLN: 500.0000 mg | Freq: Four times a day (QID) | INTRAVENOUS | Status: DC | PRN

## 2015-06-06 MED ORDER — HYDROMORPHONE HCL 1 MG/ML IJ SOLN: 0.2500 mg | INTRAMUSCULAR | Status: DC | PRN

## 2015-06-06 MED ORDER — MIDAZOLAM HCL 2 MG/2ML IJ SOLN
INTRAMUSCULAR | Status: AC
  Filled 2015-06-06: qty 2

## 2015-06-06 MED ORDER — OXYCODONE HCL 5 MG PO TABS
5.0000 mg | ORAL_TABLET | ORAL | Status: DC | PRN
  Administered 2015-06-06 – 2015-06-08 (×9): 10 mg via ORAL
  Filled 2015-06-06 (×9): qty 2

## 2015-06-06 MED ORDER — METOCLOPRAMIDE HCL 5 MG/ML IJ SOLN: 5.0000 mg | Freq: Three times a day (TID) | INTRAMUSCULAR | Status: DC | PRN

## 2015-06-06 MED ORDER — CEFUROXIME SODIUM 1.5 G IJ SOLR
INTRAMUSCULAR | Status: AC
  Filled 2015-06-06: qty 1.5

## 2015-06-06 MED ORDER — BUPIVACAINE LIPOSOME 1.3 % IJ SUSP
20.0000 mL | Freq: Once | INTRAMUSCULAR | Status: AC
  Administered 2015-06-06: 20 mL
  Filled 2015-06-06: qty 20

## 2015-06-06 MED ORDER — HYDROMORPHONE HCL 1 MG/ML IJ SOLN
1.0000 mg | INTRAMUSCULAR | Status: DC | PRN
  Administered 2015-06-06 – 2015-06-08 (×7): 1 mg via INTRAVENOUS
  Filled 2015-06-06 (×8): qty 1

## 2015-06-06 MED ORDER — ONDANSETRON HCL 4 MG/2ML IJ SOLN: 4.0000 mg | Freq: Four times a day (QID) | INTRAMUSCULAR | Status: DC | PRN

## 2015-06-06 MED ORDER — PROPOFOL 500 MG/50ML IV EMUL
INTRAVENOUS | Status: DC | PRN
  Administered 2015-06-06: 75 ug/kg/min via INTRAVENOUS

## 2015-06-06 MED ORDER — METHOCARBAMOL 500 MG PO TABS: 500.0000 mg | ORAL_TABLET | Freq: Four times a day (QID) | ORAL | Status: DC | PRN

## 2015-06-06 MED ORDER — ASPIRIN EC 325 MG PO TBEC: 325.0000 mg | DELAYED_RELEASE_TABLET | Freq: Two times a day (BID) | ORAL | Status: DC

## 2015-06-06 MED ORDER — ACETAMINOPHEN 650 MG RE SUPP: 650.0000 mg | Freq: Four times a day (QID) | RECTAL | Status: DC | PRN

## 2015-06-06 MED ORDER — FENTANYL CITRATE (PF) 100 MCG/2ML IJ SOLN
INTRAMUSCULAR | Status: DC | PRN
  Administered 2015-06-06: 50 ug via INTRAVENOUS
  Administered 2015-06-06: 100 ug via INTRAVENOUS

## 2015-06-06 MED ORDER — LIDOCAINE HCL (CARDIAC) 20 MG/ML IV SOLN
INTRAVENOUS | Status: DC | PRN
  Administered 2015-06-06: 50 mg via INTRAVENOUS

## 2015-06-06 MED ORDER — DULOXETINE HCL 60 MG PO CPEP
60.0000 mg | ORAL_CAPSULE | Freq: Two times a day (BID) | ORAL | Status: DC
  Administered 2015-06-06 – 2015-06-08 (×4): 60 mg via ORAL
  Filled 2015-06-06 (×4): qty 1

## 2015-06-06 MED ORDER — FLEET ENEMA 7-19 GM/118ML RE ENEM: 1.0000 | ENEMA | Freq: Once | RECTAL | Status: DC | PRN

## 2015-06-06 MED ORDER — LACTATED RINGERS IV SOLN
INTRAVENOUS | Status: DC | PRN
  Administered 2015-06-06: 07:00:00 via INTRAVENOUS

## 2015-06-06 MED ORDER — SODIUM CHLORIDE 0.9 % IR SOLN
Status: DC | PRN
  Administered 2015-06-06: 1000 mL

## 2015-06-06 MED ORDER — MIDAZOLAM HCL 5 MG/5ML IJ SOLN
INTRAMUSCULAR | Status: DC | PRN
  Administered 2015-06-06: 2 mg via INTRAVENOUS

## 2015-06-06 MED ORDER — DAPAGLIFLOZIN PROPANEDIOL 5 MG PO TABS
5.0000 mg | ORAL_TABLET | Freq: Every day | ORAL | Status: DC
  Filled 2015-06-06: qty 5

## 2015-06-06 MED ORDER — OMEGA-3-ACID ETHYL ESTERS 1 G PO CAPS
1.0000 g | ORAL_CAPSULE | Freq: Two times a day (BID) | ORAL | Status: DC
  Administered 2015-06-06 – 2015-06-08 (×4): 1 g via ORAL
  Filled 2015-06-06 (×4): qty 1

## 2015-06-06 MED ORDER — PHENOL 1.4 % MT LIQD: 1.0000 | OROMUCOSAL | Status: DC | PRN

## 2015-06-06 MED ORDER — BUPIVACAINE-EPINEPHRINE (PF) 0.5% -1:200000 IJ SOLN
INTRAMUSCULAR | Status: DC | PRN
  Administered 2015-06-06: 30 mL via PERINEURAL

## 2015-06-06 MED ORDER — DIPHENHYDRAMINE HCL 12.5 MG/5ML PO ELIX: 12.5000 mg | ORAL_SOLUTION | ORAL | Status: DC | PRN

## 2015-06-06 MED ORDER — PROPOFOL 10 MG/ML IV BOLUS
INTRAVENOUS | Status: DC | PRN
  Administered 2015-06-06: 25 mg via INTRAVENOUS

## 2015-06-06 MED ORDER — BUPIVACAINE IN DEXTROSE 0.75-8.25 % IT SOLN
INTRATHECAL | Status: DC | PRN
  Administered 2015-06-06: 1.7 mL via INTRATHECAL

## 2015-06-06 MED ORDER — METFORMIN HCL ER 500 MG PO TB24
1000.0000 mg | ORAL_TABLET | Freq: Every day | ORAL | Status: DC
  Administered 2015-06-07 – 2015-06-08 (×2): 1000 mg via ORAL
  Filled 2015-06-06 (×2): qty 2

## 2015-06-06 MED ORDER — TRANEXAMIC ACID 1000 MG/10ML IV SOLN
1000.0000 mg | INTRAVENOUS | Status: AC
  Administered 2015-06-06: 1000 mg via INTRAVENOUS
  Filled 2015-06-06: qty 10

## 2015-06-06 MED ORDER — SODIUM CHLORIDE 0.9 % IJ SOLN
INTRAMUSCULAR | Status: DC | PRN
Start: 2015-06-06 — End: 2015-06-06
  Administered 2015-06-06: 40 mL via INTRAVENOUS

## 2015-06-06 MED ORDER — SENNOSIDES-DOCUSATE SODIUM 8.6-50 MG PO TABS: 1.0000 | ORAL_TABLET | Freq: Every evening | ORAL | Status: DC | PRN

## 2015-06-06 MED ORDER — METHOCARBAMOL 500 MG PO TABS
500.0000 mg | ORAL_TABLET | Freq: Four times a day (QID) | ORAL | Status: DC | PRN
  Administered 2015-06-06 – 2015-06-08 (×6): 500 mg via ORAL
  Filled 2015-06-06 (×6): qty 1

## 2015-06-06 MED ORDER — ACETAMINOPHEN 325 MG PO TABS: 650.0000 mg | ORAL_TABLET | Freq: Four times a day (QID) | ORAL | Status: DC | PRN

## 2015-06-06 MED ORDER — DOCUSATE SODIUM 100 MG PO CAPS
100.0000 mg | ORAL_CAPSULE | Freq: Two times a day (BID) | ORAL | Status: DC
  Administered 2015-06-06 – 2015-06-08 (×5): 100 mg via ORAL
  Filled 2015-06-06 (×4): qty 1

## 2015-06-06 MED ORDER — CHLORHEXIDINE GLUCONATE 4 % EX LIQD: 60.0000 mL | Freq: Once | CUTANEOUS | Status: DC

## 2015-06-06 MED ORDER — OXYCODONE-ACETAMINOPHEN 5-325 MG PO TABS: 1.0000 | ORAL_TABLET | ORAL | Status: DC | PRN

## 2015-06-06 MED ORDER — CANAGLIFLOZIN 100 MG PO TABS
100.0000 mg | ORAL_TABLET | Freq: Every day | ORAL | Status: DC
Start: 2015-06-07 — End: 2015-06-08
  Administered 2015-06-07 – 2015-06-08 (×2): 100 mg via ORAL
  Filled 2015-06-06 (×3): qty 1

## 2015-06-06 MED ORDER — BISACODYL 5 MG PO TBEC: 5.0000 mg | DELAYED_RELEASE_TABLET | Freq: Every day | ORAL | Status: DC | PRN

## 2015-06-06 MED ORDER — ONDANSETRON HCL 4 MG/2ML IJ SOLN: 4.0000 mg | Freq: Once | INTRAMUSCULAR | Status: DC | PRN

## 2015-06-06 MED ORDER — FENTANYL CITRATE (PF) 250 MCG/5ML IJ SOLN
INTRAMUSCULAR | Status: AC
  Filled 2015-06-06: qty 5

## 2015-06-06 MED ORDER — PROPOFOL 10 MG/ML IV BOLUS
INTRAVENOUS | Status: AC
  Filled 2015-06-06: qty 20

## 2015-06-06 MED ORDER — ASPIRIN EC 325 MG PO TBEC
325.0000 mg | DELAYED_RELEASE_TABLET | Freq: Every day | ORAL | Status: DC
  Administered 2015-06-07 – 2015-06-08 (×2): 325 mg via ORAL
  Filled 2015-06-06 (×2): qty 1

## 2015-06-06 MED ORDER — KCL IN DEXTROSE-NACL 20-5-0.45 MEQ/L-%-% IV SOLN
INTRAVENOUS | Status: DC
  Administered 2015-06-06 – 2015-06-07 (×3): via INTRAVENOUS
  Filled 2015-06-06 (×4): qty 1000

## 2015-06-06 MED ORDER — PROMETHAZINE HCL 25 MG/ML IJ SOLN: 6.2500 mg | INTRAMUSCULAR | Status: DC | PRN

## 2015-06-06 MED ORDER — MEPERIDINE HCL 25 MG/ML IJ SOLN: 6.2500 mg | INTRAMUSCULAR | Status: DC | PRN

## 2015-06-06 MED ORDER — MENTHOL 3 MG MT LOZG: 1.0000 | LOZENGE | OROMUCOSAL | Status: DC | PRN

## 2015-06-06 MED ORDER — PREGABALIN 75 MG PO CAPS
150.0000 mg | ORAL_CAPSULE | Freq: Two times a day (BID) | ORAL | Status: DC
  Administered 2015-06-06 – 2015-06-08 (×4): 150 mg via ORAL
  Filled 2015-06-06 (×4): qty 2

## 2015-06-06 MED ORDER — METOCLOPRAMIDE HCL 5 MG PO TABS: 5.0000 mg | ORAL_TABLET | Freq: Three times a day (TID) | ORAL | Status: DC | PRN

## 2015-06-06 MED ORDER — ONDANSETRON HCL 4 MG PO TABS: 4.0000 mg | ORAL_TABLET | Freq: Four times a day (QID) | ORAL | Status: DC | PRN

## 2015-06-06 MED ORDER — ALUM & MAG HYDROXIDE-SIMETH 200-200-20 MG/5ML PO SUSP: 30.0000 mL | ORAL | Status: DC | PRN

## 2015-06-06 MED ORDER — DAPAGLIFLOZIN PRO-METFORMIN ER 5-1000 MG PO TB24: 1.0000 | ORAL_TABLET | Freq: Every day | ORAL | Status: DC

## 2015-06-06 SURGICAL SUPPLY — 62 items
BANDAGE ELASTIC 6 VELCRO ST LF (GAUZE/BANDAGES/DRESSINGS) ×2 IMPLANT
BANDAGE ESMARK 6X9 LF (GAUZE/BANDAGES/DRESSINGS) ×1 IMPLANT
BLADE SAG 18X100X1.27 (BLADE) ×3 IMPLANT
BLADE SAW SGTL 13X75X1.27 (BLADE) ×3 IMPLANT
BLADE SURG ROTATE 9660 (MISCELLANEOUS) IMPLANT
BNDG CMPR 9X6 STRL LF SNTH (GAUZE/BANDAGES/DRESSINGS) ×1
BNDG CMPR MED 10X6 ELC LF (GAUZE/BANDAGES/DRESSINGS) ×1
BNDG ELASTIC 6X10 VLCR STRL LF (GAUZE/BANDAGES/DRESSINGS) ×3 IMPLANT
BNDG ESMARK 6X9 LF (GAUZE/BANDAGES/DRESSINGS) ×3
BOWL SMART MIX CTS (DISPOSABLE) ×3 IMPLANT
CAPT KNEE TOTAL 3 ATTUNE ×2 IMPLANT
CEMENT HV SMART SET (Cement) ×6 IMPLANT
COVER SURGICAL LIGHT HANDLE (MISCELLANEOUS) ×3 IMPLANT
CUFF TOURNIQUET SINGLE 34IN LL (TOURNIQUET CUFF) ×3 IMPLANT
CUFF TOURNIQUET SINGLE 44IN (TOURNIQUET CUFF) IMPLANT
DRAPE EXTREMITY T 121X128X90 (DRAPE) ×3 IMPLANT
DRAPE U-SHAPE 47X51 STRL (DRAPES) ×3 IMPLANT
DURAPREP 26ML APPLICATOR (WOUND CARE) ×6 IMPLANT
ELECT REM PT RETURN 9FT ADLT (ELECTROSURGICAL) ×3
ELECTRODE REM PT RTRN 9FT ADLT (ELECTROSURGICAL) ×1 IMPLANT
EVACUATOR 1/8 PVC DRAIN (DRAIN) IMPLANT
GAUZE SPONGE 4X4 12PLY STRL (GAUZE/BANDAGES/DRESSINGS) ×3 IMPLANT
GAUZE XEROFORM 1X8 LF (GAUZE/BANDAGES/DRESSINGS) ×3 IMPLANT
GLOVE BIO SURGEON STRL SZ7.5 (GLOVE) ×3 IMPLANT
GLOVE BIO SURGEON STRL SZ8.5 (GLOVE) ×5 IMPLANT
GLOVE BIOGEL PI IND STRL 8 (GLOVE) ×2 IMPLANT
GLOVE BIOGEL PI IND STRL 9 (GLOVE) ×1 IMPLANT
GLOVE BIOGEL PI INDICATOR 8 (GLOVE) ×4
GLOVE BIOGEL PI INDICATOR 9 (GLOVE) ×4
GOWN STRL REUS W/ TWL LRG LVL3 (GOWN DISPOSABLE) ×1 IMPLANT
GOWN STRL REUS W/ TWL XL LVL3 (GOWN DISPOSABLE) ×2 IMPLANT
GOWN STRL REUS W/TWL LRG LVL3 (GOWN DISPOSABLE) ×3
GOWN STRL REUS W/TWL XL LVL3 (GOWN DISPOSABLE) ×6
HANDPIECE INTERPULSE COAX TIP (DISPOSABLE) ×3
HOOD PEEL AWAY FACE SHEILD DIS (HOOD) ×6 IMPLANT
KIT BASIN OR (CUSTOM PROCEDURE TRAY) ×3 IMPLANT
KIT ROOM TURNOVER OR (KITS) ×3 IMPLANT
MANIFOLD NEPTUNE II (INSTRUMENTS) ×3 IMPLANT
NDL SPNL 18GX3.5 QUINCKE PK (NEEDLE) IMPLANT
NEEDLE SPNL 18GX3.5 QUINCKE PK (NEEDLE) IMPLANT
NS IRRIG 1000ML POUR BTL (IV SOLUTION) ×3 IMPLANT
PACK TOTAL JOINT (CUSTOM PROCEDURE TRAY) ×3 IMPLANT
PACK UNIVERSAL I (CUSTOM PROCEDURE TRAY) ×3 IMPLANT
PAD ARMBOARD 7.5X6 YLW CONV (MISCELLANEOUS) ×6 IMPLANT
PADDING CAST COTTON 6X4 STRL (CAST SUPPLIES) ×3 IMPLANT
SET HNDPC FAN SPRY TIP SCT (DISPOSABLE) ×1 IMPLANT
SPONGE LAP 18X18 X RAY DECT (DISPOSABLE) ×2 IMPLANT
SUT VIC AB 0 CT1 27 (SUTURE) ×3
SUT VIC AB 0 CT1 27XBRD ANBCTR (SUTURE) ×1 IMPLANT
SUT VIC AB 1 CTX 36 (SUTURE) ×3
SUT VIC AB 1 CTX36XBRD ANBCTR (SUTURE) ×1 IMPLANT
SUT VIC AB 2-0 CT1 27 (SUTURE) ×3
SUT VIC AB 2-0 CT1 TAPERPNT 27 (SUTURE) ×1 IMPLANT
SUT VIC AB 3-0 CT1 27 (SUTURE) ×3
SUT VIC AB 3-0 CT1 TAPERPNT 27 (SUTURE) ×1 IMPLANT
SUT VIC AB 3-0 FS2 27 (SUTURE) ×3 IMPLANT
SYR 50ML LL SCALE MARK (SYRINGE) ×3 IMPLANT
TOWEL OR 17X24 6PK STRL BLUE (TOWEL DISPOSABLE) ×3 IMPLANT
TOWEL OR 17X26 10 PK STRL BLUE (TOWEL DISPOSABLE) ×3 IMPLANT
TRAY CATH 16FR W/PLASTIC CATH (SET/KITS/TRAYS/PACK) IMPLANT
WATER STERILE IRR 1000ML POUR (IV SOLUTION) ×9 IMPLANT
YANKAUER SUCT BULB TIP NO VENT (SUCTIONS) ×2 IMPLANT

## 2015-06-06 NOTE — Care Management (Signed)
Utilization review completed. Arva Slaugh, RN Case Manager [phone]. 

## 2015-06-06 NOTE — Interval H&P Note (Signed)
History and Physical Interval Note:  06/06/2015 7:16 AM  Madison Stein  has presented today for surgery, with the diagnosis of OA Left Knee  The various methods of treatment have been discussed with the patient and family. After consideration of risks, benefits and other options for treatment, the patient has consented to  Procedure(s): TOTAL KNEE ARTHROPLASTY (Left) as a surgical intervention .  The patient's history has been reviewed, patient examined, no change in status, stable for surgery.  I have reviewed the patient's chart and labs.  Questions were answered to the patient's satisfaction.     Kerin Salen

## 2015-06-06 NOTE — Op Note (Signed)
PATIENT ID:      Madison Stein  MRN:     [id number] DOB/AGE:    [birth year] / 64 y.o.       OPERATIVE REPORT    DATE OF PROCEDURE:  06/06/2015       PREOPERATIVE DIAGNOSIS:   OA Left Knee      Estimated body mass index is 40.52 kg/(m^2) as calculated from the following:   Height as of this encounter: 5\' 3"  (1.6 m).   Weight as of this encounter: 103.732 kg (228 lb 11 oz).                                                        POSTOPERATIVE DIAGNOSIS:   OA Left Knee                                                                      PROCEDURE:  Procedure(s): TOTAL KNEE ARTHROPLASTY Using DepuyAttune RP implants #5L Femur, #5Tibia, 5 mm Attune RP bearing, 38 Patella     SURGEON: Juliene Kirsh J    ASSISTANT:   [employer] K[employer]   (Present and scrubbed throughout the case, critical for assistance with exposure, retraction, instrumentation, and closure.)         ANESTHESIA: Spinal, Exparel  EBL: 300  FLUID REPLACEMENT: 1600 crystalloid  TOURNIQUET TIME: 80min  Drains: None  Tranexamic Acid: 1gm iv   COMPLICATIONS:  None         INDICATIONS FOR PROCEDURE: The patient has  OA Left Knee, varus deformities, XR shows bone on bone arthritis, lateral subluxation of tibia. Patient has failed all conservative measures including anti-inflammatory medicines, narcotics, attempts at  exercise and weight loss, cortisone injections and viscosupplementation.  Risks and benefits of surgery have been discussed, questions answered.   DESCRIPTION OF PROCEDURE: The patient identified by armband, received  IV antibiotics, in the holding area at [facility]. Patient taken to the operating room, appropriate anesthetic  monitors were attached, and Spinal anesthesia was  induced. Tourniquet  applied high to the operative thigh. Lateral post and foot positioner  applied to the table, the lower extremity was then prepped and draped  in usual sterile fashion from the toes to the tourniquet. Time-out  procedure was performed. We began the operation, with the knee flexed 120 degrees, by making the anterior midline incision starting at handbreadth above the patella going over the patella 1 cm medial to and 4 cm distal to the tibial tubercle. Small bleeders in the skin and the  subcutaneous tissue identified and cauterized. Transverse retinaculum was incised and reflected medially and a medial parapatellar arthrotomy was accomplished. the patella was everted and theprepatellar fat pad resected. The superficial medial collateral  ligament was then elevated from anterior to posterior along the proximal  flare of the tibia and anterior half of the menisci resected. The knee was hyperflexed exposing bone on bone arthritis. Peripheral and notch osteophytes as well as the cruciate ligaments were then resected. We continued to  work our way around posteriorly along the proximal tibia, and externally  rotated the tibia subluxing it out from underneath the femur. A McHale  retractor was placed through the notch and a lateral Hohmann retractor  placed, and we then drilled through the proximal tibia in line with the  axis of the tibia followed by an intramedullary guide rod and 2-degree  posterior slope cutting guide. The tibial cutting guide, 3 degree posterior sloped, was pinned into place allowing resection of 4 mm of bone medially and 11 mm of bone laterally. Satisfied with the tibial resection, we then  entered the distal femur 2 mm anterior to the PCL origin with the  intramedullary guide rod and applied the distal femoral cutting guide  set at 9 mm, with 5 degrees of valgus. This was pinned along the  epicondylar axis. At this point, the distal femoral cut was accomplished without difficulty. We then sized for a #5L femoral component and pinned the guide in 3 degrees of external rotation. The chamfer cutting guide was pinned into place. The anterior, posterior, and chamfer cuts were accomplished without  difficulty followed by  the Attune RP box cutting guide and the box cut. We also removed posterior osteophytes from the posterior femoral condyles. At this  time, the knee was brought into full extension. We checked our  extension and flexion gaps and found them symmetric for a 5 mm bearing. Distracting in extension with a lamina spreader, the posterior horns of the menisci were removed, and Exparel, diluted to 60 cc, was injected into the capsule and synovium of the knee. The posterior patella cut was accomplished with the 9.5 mm Attune cutting guide, sized at 38 dome, and the fixation pegs drilled.The knee  was then once again hyperflexed exposing the proximal tibia. We sized for a # 5 tibial base plate, applied the smokestack and the conical reamer followed by the the Delta fin keel punch. We then hammered into place the Attune RP trial femoral component, drilled the lugs, inserted a  5 mm trial bearing, trial patellar button, and took the knee through range of motion from 0-130 degrees. No thumb pressure was required for patellar Tracking. At this point, the limb was wrapped with an Esmarch bandage and the tourniquet inflated to 350 mmHg. All trial components were removed, mating surfaces irrigated with pulse lavage, and dried with suction and sponges. A double batch of DePuy HV cement with 1500 mg of Zinacef was mixed and applied to all bony metallic mating surfaces except for the posterior condyles of the femur itself. In order, we  hammered into place the tibial tray and removed excess cement, the femoral component and removed excess cement. The final Attune RP bearing  was inserted, and the knee brought to full extension with compression.  The patellar button was clamped into place, and excess cement  removed. While the cement cured the wound was irrigated out with normal saline solution pulse lavage. Ligament stability and patellar tracking were checked and found to be excellent. The parapatellar  arthrotomy was closed with  running #1 Vicryl suture. The subcutaneous tissue with 0 and 2-0 undyed  Vicryl suture, and the skin with running 3-0 SQ vicryl. A dressing of Xeroform,  4 x 4, dressing sponges, Webril, and Ace wrap applied. The patient  awakened, and taken to recovery room without difficulty.   Laquinn Shippy J 06/06/2015, 9:08 AM

## 2015-06-06 NOTE — Transfer of Care (Signed)
Immediate Anesthesia Transfer of Care Note  Patient: Madison Stein  Procedure(s) Performed: Procedure(s): TOTAL KNEE ARTHROPLASTY (Left)  Patient Location: PACU  Anesthesia Type:Regional and Spinal  Level of Consciousness: awake, alert  and oriented  Airway & Oxygen Therapy: Patient Spontanous Breathing  Post-op Assessment: Report given to RN and Post -op Vital signs reviewed and stable  Post vital signs: Reviewed and stable  Last Vitals:  Filed Vitals:   06/06/15 1045 06/06/15 1100  BP: 118/88 107/54  Pulse: 88   Temp: 36.7 C 36.7 C  Resp: 18 18    Complications: No apparent anesthesia complications

## 2015-06-06 NOTE — Anesthesia Preprocedure Evaluation (Addendum)
Anesthesia Evaluation  Patient identified by MRN, date of birth, ID band Patient awake    History of Anesthesia Complications (+) PONV and history of anesthetic complications  Airway Mallampati: II  TM Distance: >3 FB Neck ROM: Full    Dental  (+) Teeth Intact   Pulmonary former smoker,    Pulmonary exam normal        Cardiovascular negative cardio ROS Normal cardiovascular exam Rhythm:Regular Rate:Normal     Neuro/Psych  Neuromuscular disease    GI/Hepatic negative GI ROS, Neg liver ROS,   Endo/Other  diabetes, Oral Hypoglycemic AgentsMorbid obesity  Renal/GU      Musculoskeletal  (+) Arthritis , Fibromyalgia -  Abdominal (+) + obese,   Peds  Hematology   Anesthesia Other Findings   Reproductive/Obstetrics                            Anesthesia Physical Anesthesia Plan  ASA: III  Anesthesia Plan: Spinal   Post-op Pain Management: MAC Combined w/ Regional for Post-op pain   Induction: Intravenous  Airway Management Planned: Natural Airway  Additional Equipment:   Intra-op Plan:   Post-operative Plan:   Informed Consent: I have reviewed the patients History and Physical, chart, labs and discussed the procedure including the risks, benefits and alternatives for the proposed anesthesia with the patient or authorized representative who has indicated his/her understanding and acceptance.   Dental advisory given  Plan Discussed with: CRNA and Surgeon  Anesthesia Plan Comments:        Anesthesia Quick Evaluation

## 2015-06-06 NOTE — Discharge Instructions (Signed)

## 2015-06-06 NOTE — Evaluation (Signed)
Physical Therapy Evaluation Patient Details Name: Madison Stein MRN: [id number] DOB: Dec 10, 1950 Today's Date: 06/06/2015   History of Present Illness  Pt admitted for L TKA. PMHx: bil THA, DM, fibromyalgia, depression, right carpal tunnel release  Clinical Impression  Evaluation revealed ROM, strength, and gait deficits in L LE. Pt c/o of gastroc spasms that diminished during amb, was premedicated for spasms. Education on HEP, safety with transfers, appropriate use of CPM and bone foam, and POC, pt and sister verbally agreed. Pt would benefit from acute PT to improve strength, ROM, gait, and safety with transfers. D/C to home with HHPT is most appropriate at this time.     Follow Up Recommendations Home health PT    Equipment Recommendations  None recommended by PT    Recommendations for Other Services OT consult     Precautions / Restrictions Precautions Precautions: Knee;Fall Precaution Comments: pt reports 4 falls in the last year Restrictions Weight Bearing Restrictions: Yes LLE Weight Bearing: Weight bearing as tolerated      Mobility  Bed Mobility Overal bed mobility: Modified Independent             General bed mobility comments: HOB elevated, use of bed rail to pivot   Transfers Overall transfer level: Needs assistance   Transfers: Sit to/from Stand Sit to Stand: Min guard         General transfer comment: Min guard for stability, VC for hand placement and to slide L LE out  Ambulation/Gait Ambulation/Gait assistance: Min guard Ambulation Distance (Feet): 35 Feet (pt began feeling mild nausea at 25") Assistive device: Rolling walker (2 wheeled) Gait Pattern/deviations: Step-through pattern;Decreased stance time - left   Gait velocity interpretation: Below normal speed for age/gender General Gait Details: Pt initially did not WB much but gradually increased weight shift to L side, min guard for stability  Stairs            Wheelchair  Mobility    Modified Rankin (Stroke Patients Only)       Balance                                             Pertinent Vitals/Pain Pain Assessment: 0-10 Pain Score: 10-Worst pain ever Pain Location: L knee Pain Descriptors / Indicators: Aching Pain Intervention(s): Monitored during session;Premedicated before session;Repositioned;Ice applied;Limited activity within patient's tolerance    Home Living Family/patient expects to be discharged to:: Private residence Living Arrangements: Alone Available Help at Discharge: Family;Available 24 hours/day Type of Home: House Home Access: Stairs to enter Entrance Stairs-Rails: None Entrance Stairs-Number of Steps: 2 Home Layout: One level Home Equipment: Shower seat - built in;Cane - single point;Bedside commode;Walker - 2 wheels;Shower seat;Adaptive equipment      Prior Function Level of Independence: Independent with assistive device(s)               Hand Dominance   Dominant Hand: Left    Extremity/Trunk Assessment   Upper Extremity Assessment: Overall WFL for tasks assessed           Lower Extremity Assessment: LLE deficits/detail   LLE Deficits / Details:  Diminished strength and ROM   Cervical / Trunk Assessment: Normal  Communication   Communication: No difficulties  Cognition Arousal/Alertness: Awake/alert Behavior During Therapy: WFL for tasks assessed/performed Overall Cognitive Status: Within Functional Limits for tasks assessed  General Comments      Exercises Total Joint Exercises Ankle Circles/Pumps: AROM;Seated;Both;20 reps Quad Sets: AROM;Left;10 reps;Supine Heel Slides: Left;10 reps;Supine;AROM      Assessment/Plan    PT Assessment Patient needs continued PT services  PT Diagnosis Difficulty walking;Acute pain   PT Problem List Decreased strength;Decreased range of motion;Decreased activity tolerance;Decreased mobility;Decreased  coordination;Decreased knowledge of use of DME;Decreased safety awareness;Pain  PT Treatment Interventions DME instruction;Gait training;Stair training;Functional mobility training;Therapeutic exercise;Patient/family education   PT Goals (Current goals can be found in the Care Plan section) Acute Rehab PT Goals Patient Stated Goal: return home without pain PT Goal Formulation: With patient Time For Goal Achievement: 06/13/15 Potential to Achieve Goals: Good    Frequency 7X/week   Barriers to discharge        Co-evaluation               End of Session Equipment Utilized During Treatment: Gait belt Activity Tolerance: Patient limited by fatigue;Patient limited by pain Patient left: in chair;with call bell/phone within reach;with family/visitor present Nurse Communication: Mobility status;Weight bearing status         Time: [id number] PT Time Calculation (min) (ACUTE ONLY): 23 min   Charges:   PT Evaluation $Initial PT Evaluation Tier I: 1 Procedure PT Treatments $Therapeutic Activity: 8-22 mins   PT G CodesHaynes Bast 07/06/15, 1:52 PM Haynes Bast, SPT 07-06-2015 1:57 PM

## 2015-06-06 NOTE — Anesthesia Procedure Notes (Addendum)
Anesthesia Regional Block:  Adductor canal block  Pre-Anesthetic Checklist: ,, timeout performed, Correct Patient, Correct Site, Correct Laterality, Correct Procedure, Correct Position, site marked, Risks and benefits discussed,  Surgical consent,  Pre-op evaluation,  At surgeon's request and post-op pain management  Laterality: Lower  Prep: chloraprep       Needles:   Needle Type: Echogenic Stimulator Needle     Needle Length: 9cm 9 cm Needle Gauge: 21 and 21 G  Needle insertion depth: 5 cm   Additional Needles:  Procedures: ultrasound guided (picture in chart) Adductor canal block Narrative:  Start time: 06/06/2015 7:00 AM End time: 06/06/2015 7:12 AM Injection made incrementally with aspirations every 5 mL.  Performed by: Personally  Anesthesiologist: MASSAGEE, TERRY  Additional Notes: Tolerated well   Spinal Patient location during procedure: OR Start time: 06/06/2015 7:35 AM End time: 06/06/2015 7:42 AM Staffing Anesthesiologist: Lillia Abed Performed by: anesthesiologist  Preanesthetic Checklist Completed: patient identified, site marked, surgical consent, pre-op evaluation, timeout performed, IV checked, risks and benefits discussed and monitors and equipment checked Spinal Block Patient position: sitting Prep: Betadine Patient monitoring: heart rate, cardiac monitor, continuous pulse ox and blood pressure Approach: right paramedian Location: L3-4 Injection technique: single-shot Needle Needle type: Pencan  Needle gauge: 24 G Needle length: 9 cm Needle insertion depth: 8 cm

## 2015-06-06 NOTE — Progress Notes (Signed)
Orthopedic Tech Progress Note Patient Details:  Madison Stein Aug 01, 1950 [id number]  CPM Left Knee CPM Left Knee: On Left Knee Flexion (Degrees): 40 Left Knee Extension (Degrees): 10 Additional Comments: Trapeze bar and foot roll   [name] 06/06/2015, 10:43 AM

## 2015-06-07 ENCOUNTER — Encounter (HOSPITAL_COMMUNITY): Payer: Self-pay | Admitting: Orthopedic Surgery

## 2015-06-07 LAB — CBC
HCT: 32 % — ABNORMAL LOW (ref 36.0–46.0)
Hemoglobin: 10 g/dL — ABNORMAL LOW (ref 12.0–15.0)
MCH: 25.1 pg — AB (ref 26.0–34.0)
MCHC: 31.3 g/dL (ref 30.0–36.0)
MCV: 80.4 fL (ref 78.0–100.0)
PLATELETS: 207 10*3/uL (ref 150–400)
RBC: 3.98 MIL/uL (ref 3.87–5.11)
RDW: 14.9 % (ref 11.5–15.5)
WBC: 9.2 10*3/uL (ref 4.0–10.5)

## 2015-06-07 LAB — BASIC METABOLIC PANEL
Anion gap: 7 (ref 5–15)
BUN: 8 mg/dL (ref 6–20)
CALCIUM: 8.5 mg/dL — AB (ref 8.9–10.3)
CO2: 23 mmol/L (ref 22–32)
CREATININE: 0.76 mg/dL (ref 0.44–1.00)
Chloride: 105 mmol/L (ref 101–111)
GFR calc Af Amer: >60 mL/min (ref >60)
GLUCOSE: 199 mg/dL — AB (ref 65–99)
Potassium: 4.4 mmol/L (ref 3.5–5.1)
SODIUM: 135 mmol/L (ref 135–145)

## 2015-06-07 LAB — GLUCOSE, CAPILLARY
GLUCOSE-CAPILLARY: 164 mg/dL — AB (ref 65–99)
GLUCOSE-CAPILLARY: 183 mg/dL — AB (ref 65–99)
GLUCOSE-CAPILLARY: 152 mg/dL — AB (ref 65–99)
Glucose-Capillary: 191 mg/dL — ABNORMAL HIGH (ref 65–99)
Glucose-Capillary: 204 mg/dL — ABNORMAL HIGH (ref 65–99)

## 2015-06-07 NOTE — Care Management Note (Addendum)
Case Management Note  Patient Details  Name: Madison Stein MRN: [id number] Date of Birth: Jan 20, 1951  Subjective/Objective:             S/p left total knee arthroplasty       Action/Plan: Spoke with patient about [facility], she selected [facility] which she has worked with in the past. Veterinary surgeon at [employer] and set up [place]. Patient stated that she already has a rolling walker and [employer] and [employer] and [place] will be delivering CPM to her home. Patient stated that her sister will be available to assist her after discharge.         Expected Discharge Date:                  Expected Discharge Plan:  [place]  In-House Referral:  NA  Discharge planning Services  CM Consult  Post Acute Care Choice:  Durable Medical Equipment, Home Health Choice offered to:  Patient  DME Arranged:  CPM DME Agency:  [employer]  HH Arranged:  PT, OT, RN [place] Agency:  [place]  Status of Service:  Completed, signed off  Medicare Important Message Given:    Date Medicare IM Given:    Medicare IM give by:    Date Additional Medicare IM Given:    Additional Medicare Important Message give by:     If discussed at [place] of Stay Meetings, dates discussed:    Additional Comments:  Nila Nephew, RN 06/07/2015, 1:52 PM

## 2015-06-07 NOTE — Progress Notes (Signed)
Patient ID: Madison Stein, female   DOB: 1951/04/04, 64 y.o.   MRN: [id number] PATIENT ID: Madison Stein  MRN: [id number]  DOB/AGE:  January 29, 1951 / 64 y.o.  1 Day Post-Op Procedure(s) (LRB): TOTAL KNEE ARTHROPLASTY (Left)    PROGRESS NOTE Subjective: Patient is alert, oriented, no Nausea, no Vomiting, yes passing gas. Taking PO well. Denies SOB, Chest or Calf Pain. Using Incentive Spirometer, PAS in place. Ambulate 35', CPM 0-40 Patient reports pain as 7/10 .    Objective: Vital signs in last 24 hours: Filed Vitals:   06/06/15 1045 06/06/15 1100 06/06/15 2027 06/07/15 0548  BP: 118/88 107/54 108/60 129/56  Pulse: 88  108 108  Temp: 98 F (36.7 C) 98.1 F (36.7 C) 98.6 F (37 C) 98.2 F (36.8 C)  TempSrc:  Oral Oral Oral  Resp: 18 18 18 18   Height:      Weight:      SpO2: 97% 97% 99% 95%      Intake/Output from previous day: I/O last 3 completed shifts: In: 1380 [P.O.:480; I.V.:900] Out: -    Intake/Output this shift:     LABORATORY DATA:  Recent Labs  06/06/15 1608 06/06/15 2130 06/07/15 0351 06/07/15 0638  WBC  --   --  9.2  --   HGB  --   --  10.0*  --   HCT  --   --  32.0*  --   PLT  --   --  207  --   NA  --   --  135  --   K  --   --  4.4  --   CL  --   --  105  --   CO2  --   --  23  --   BUN  --   --  8  --   CREATININE  --   --  0.76  --   GLUCOSE  --   --  199*  --   GLUCAP 201* 191*  --  164*  CALCIUM  --   --  8.5*  --     Examination: Neurologically intact ABD soft Neurovascular intact Sensation intact distally Intact pulses distally Dorsiflexion/Plantar flexion intact Incision: dressing C/D/I No cellulitis present Compartment soft}  Assessment:   1 Day Post-Op Procedure(s) (LRB): TOTAL KNEE ARTHROPLASTY (Left) ADDITIONAL DIAGNOSIS: Expected Acute Blood Loss Anemia, Diabetes  Plan: PT/OT WBAT, CPM 5/hrs day until ROM 0-90 degrees, then D/C CPM DVT Prophylaxis:  SCDx72hrs, ASA 325 mg BID x 2 weeks DISCHARGE PLAN: Home DISCHARGE  NEEDS: HHPT, CPM, Walker and 3-in-1 comode seat     Tylan Kinn J 06/07/2015, 7:24 AM

## 2015-06-07 NOTE — Anesthesia Postprocedure Evaluation (Deleted)
Anesthesia Post Note  Patient: Madison Stein  Procedure(s) Performed: Procedure(s) (LRB): TOTAL KNEE ARTHROPLASTY (Left)  Patient location during evaluation: PACU Anesthesia Type: Spinal and MAC Level of consciousness: awake and alert Pain management: pain level controlled Vital Signs Assessment: post-procedure vital signs reviewed and stable Respiratory status: spontaneous breathing and respiratory function stable Cardiovascular status: blood pressure returned to baseline and stable Postop Assessment: spinal receding Anesthetic complications: no    Last Vitals:  Filed Vitals:   06/07/15 0548 06/07/15 1400  BP: 129/56 113/52  Pulse: 108 109  Temp: 36.8 C 36.6 C  Resp: 18 16    Last Pain:  Filed Vitals:   06/07/15 1456  PainSc: Asleep    LLE Motor Response: Purposeful movement LLE Sensation: Pain, Full sensation RLE Motor Response: Purposeful movement RLE Sensation: Full sensation L Sensory Level: L2-Upper inner thigh, upper buttock R Sensory Level: L2-Upper inner thigh, upper buttock  Beila Purdie DAVID

## 2015-06-07 NOTE — Progress Notes (Signed)
Inpatient Diabetes Program Recommendations  AACE/ADA: New Consensus Statement on Inpatient Glycemic Control (2015)  Target Ranges:  Prepandial:   less than 140 mg/dL      Peak postprandial:   less than 180 mg/dL (1-2 hours)      Critically ill patients:  140 - 180 mg/dL   Review of Glycemic Control:  Results for DEZLYNN, KUHNKE (MRN [id number]) as of 06/07/2015 09:23  Ref. Range 06/06/2015 06:30 06/06/2015 10:21 06/06/2015 16:08 06/06/2015 21:30 06/07/2015 06:38  Glucose-Capillary Latest Ref Range: 65-99 mg/dL 153 (H) 131 (H) 201 (H) 191 (H) 164 (H)   Diabetes history: Diabetes mellitus Outpatient Diabetes medications: Dapagliflozin-Metformin 11-998 mg daily Current orders for Inpatient glycemic control:  Invokana 100 mg daily, Metformin XR 1000 mg with breakfast Inpatient Diabetes Program Recommendations:   Please consider adding Novolog moderate tid with meals and HS.  Thanks, Adah Perl, RN, BC-ADM Inpatient Diabetes Coordinator Pager [phone] (8a-5p)

## 2015-06-07 NOTE — Evaluation (Signed)
Occupational Therapy Evaluation Patient Details Name: Madison Stein MRN: [id number] DOB: 1951/06/01 Today's Date: 06/07/2015    History of Present Illness Pt admitted for L TKA. PMHx: bil THA, DM, fibromyalgia, depression, right carpal tunnel release   Clinical Impression   Pt reports she was independent with ADLs PTA. Currently pt is overall min guard for ADLs and mobility with the exception of min assist for LB ADLs. All education complete; pt with no further questions or concerns for OT at this time. Pt planning to d/c home with 24/7 supervision from family. Pt ready to d/c from an OT standpoint; signing off at this time. Thank you for this referral.     Follow Up Recommendations  No OT follow up;Supervision/Assistance - 24 hour    Equipment Recommendations  None recommended by OT (Pt reports having all equipment she needs)    Recommendations for Other Services       Precautions / Restrictions Precautions Precautions: Knee;Fall Precaution Comments: per PT eval, pt reports 4 falls in the last year Restrictions Weight Bearing Restrictions: Yes LLE Weight Bearing: Weight bearing as tolerated      Mobility Bed Mobility Overal bed mobility: Needs Assistance Bed Mobility: Supine to Sit;Sit to Supine     Supine to sit: Supervision Sit to supine: Min assist   General bed mobility comments: Min assist to manage LLE  Transfers Overall transfer level: Needs assistance Equipment used: Rolling walker (2 wheeled) Transfers: Sit to/from Stand Sit to Stand: Min guard         General transfer comment: Good hand placement and technique. Sit to stand from EOB x 1, BSC x 1    Balance Overall balance assessment: Needs assistance Sitting-balance support: Feet supported Sitting balance-Leahy Scale: Good     Standing balance support: No upper extremity supported;During functional activity Standing balance-Leahy Scale: Fair Standing balance comment: Pt able to stand at sink  and wash hands with no UE supported                             ADL Overall ADL's : Needs assistance/impaired Eating/Feeding: Set up;Sitting   Grooming: Wash/dry hands;Supervision/safety;Standing       Lower Body Bathing: Minimal assistance;Sit to/from stand       Lower Body Dressing: Minimal assistance;Sit to/from stand Lower Body Dressing Details (indicate cue type and reason): Educated on compensatory strategies for LB ADLs; pt verbalized understanding. Toilet Transfer: Min guard;Ambulation;BSC;RW (BSC over toilet )   Toileting- Clothing Manipulation and Hygiene: Min guard;Sit to/from stand Toileting - Clothing Manipulation Details (indicate cue type and reason): for toilet hygiene Tub/ Shower Transfer: Min guard;Ambulation;Shower seat;3 in 1 Tub/Shower Transfer Details (indicate cue type and reason): Pt able to verbalize and demonstrate side stepping technique used with previous hip surgery.  Functional mobility during ADLs: Min guard;Rolling walker General ADL Comments: No family present for OT eval. Educated on home safety, need for close supervision during ADLs and mobility, edema management; pt verbalized understanding.      Vision     Perception     Praxis      Pertinent Vitals/Pain Pain Assessment: 0-10 Pain Score: 4  Pain Location: L upper leg Pain Descriptors / Indicators: Burning;Aching Pain Intervention(s): Limited activity within patient's tolerance;Monitored during session;Repositioned;Ice applied     Hand Dominance Left   Extremity/Trunk Assessment Upper Extremity Assessment Upper Extremity Assessment: Overall WFL for tasks assessed   Lower Extremity Assessment Lower Extremity Assessment: Defer to PT evaluation  Cervical / Trunk Assessment Cervical / Trunk Assessment: Normal   Communication Communication Communication: No difficulties   Cognition Arousal/Alertness: Awake/alert Behavior During Therapy: WFL for tasks  assessed/performed Overall Cognitive Status: Within Functional Limits for tasks assessed                     General Comments       Exercises       Shoulder Instructions      Home Living Family/patient expects to be discharged to:: Private residence Living Arrangements: Alone Available Help at Discharge: Family;Available 24 hours/day Type of Home: House Home Access: Stairs to enter [employer] of Steps: 2 Entrance Stairs-Rails: None Home Layout: One level     Bathroom Shower/Tub: Walk-in Hydrologist: Standard Bathroom Accessibility: Yes How Accessible: Accessible via walker Home Equipment: Shower seat - built in;Cane - single point;Bedside commode;Walker - 2 wheels;Adaptive equipment Adaptive Equipment: Reacher        Prior Functioning/Environment Level of Independence: Independent with assistive device(s)        Comments: cane for ambulation PTA    OT Diagnosis: Acute pain   OT Problem List:     OT Treatment/Interventions:      OT Goals(Current goals can be found in the care plan section) Acute Rehab OT Goals Patient Stated Goal: to go home OT Goal Formulation: With patient  OT Frequency:     Barriers to D/C:            Co-evaluation              End of Session Equipment Utilized During Treatment: Gait belt;Rolling walker;Oxygen CPM Left Knee CPM Left Knee: Off  Activity Tolerance: Patient tolerated treatment well Patient left: in bed;with call bell/phone within reach;with SCD's reapplied   Time: [id number] OT Time Calculation (min): 23 min Charges:  OT General Charges $OT Visit: 1 Procedure OT Evaluation $Initial OT Evaluation Tier I: 1 Procedure OT Treatments $Self Care/Home Management : 8-22 mins G-Codes:     Binnie Kand M.S., OTR/L Pager: [phone]  06/07/2015, 11:26 AM

## 2015-06-07 NOTE — Progress Notes (Signed)
Physical Therapy Treatment Patient Details Name: Madison Stein MRN: [id number] DOB: Jun 26, 1951 Today's Date: 06/07/2015    History of Present Illness Pt admitted for L TKA. PMHx: bil THA, DM, fibromyalgia, depression, right carpal tunnel release    PT Comments    Patient continues to progress toward mobility goals with ability to ascend/desced two steps and ambulate 120 ft total this session. Overall mobility level this session min guard/min A. Patient will continue to benefit from further skilled PT for increased independence and safety with mobility. Current d/c plan remains appropriate.  Follow Up Recommendations  Home health PT     Equipment Recommendations  None recommended by PT    Recommendations for Other Services       Precautions / Restrictions Precautions Precautions: Knee;Fall Precaution Comments: per PT eval, pt reports 4 falls in the last year Restrictions Weight Bearing Restrictions: Yes LLE Weight Bearing: Weight bearing as tolerated    Mobility  Bed Mobility Overal bed mobility: Needs Assistance Bed Mobility: Supine to Sit;Sit to Supine     Supine to sit: Supervision Sit to supine: Min assist   General bed mobility comments: up in chair upon arrival  Transfers Overall transfer level: Needs assistance Equipment used: Rolling walker (2 wheeled) Transfers: Sit to/from Stand Sit to Stand: Min guard         General transfer comment: good hand placement and technique  Ambulation/Gait Ambulation/Gait assistance: Min guard Ambulation Distance (Feet): 65 Feet (X 2) Assistive device: Rolling walker (2 wheeled) Gait Pattern/deviations: Step-through pattern;Decreased stance time - left;Decreased stride length Gait velocity: decreased   General Gait Details: cues for maintaining upright posture and encouragment for bearing more weight through Lt LE   Stairs Stairs: Yes Stairs assistance: Min assist Stair Management: Backwards;With walker Number  of Stairs: 2 General stair comments: good safety awareness and ability to teach back precuations for stair ambulation  Wheelchair Mobility    Modified Rankin (Stroke Patients Only)       Balance Overall balance assessment: Needs assistance Sitting-balance support: Feet supported Sitting balance-Leahy Scale: Good     Standing balance support: No upper extremity supported Standing balance-Leahy Scale: Fair Standing balance comment: Pt able to stand at sink and wash hands with no UE supported                     Cognition Arousal/Alertness: Awake/alert Behavior During Therapy: WFL for tasks assessed/performed Overall Cognitive Status: Within Functional Limits for tasks assessed                      Exercises Total Joint Exercises Ankle Circles/Pumps: AROM;Both;20 reps Quad Sets: AROM;Left;10 reps Short Arc Quad: AROM;Left;10 reps Heel Slides: AROM;Left;10 reps Hip ABduction/ADduction: AROM;Left;10 reps Goniometric ROM: 3-55    General Comments        Pertinent Vitals/Pain Pain Assessment: 0-10 Pain Score: 4  Pain Location: Lt knee Pain Descriptors / Indicators: Sore Pain Intervention(s): Limited activity within patient's tolerance;Monitored during session;Premedicated before session    Home Living Family/patient expects to be discharged to:: Private residence Living Arrangements: Alone Available Help at Discharge: Family;Available 24 hours/day Type of Home: House Home Access: Stairs to enter Entrance Stairs-Rails: None Home Layout: One level Home Equipment: Shower seat - built in;Cane - single point;Bedside commode;Walker - 2 wheels;Adaptive equipment      Prior Function Level of Independence: Independent with assistive device(s)      Comments: cane for ambulation PTA   PT Goals (current goals can now  be found in the care plan section) Acute Rehab PT Goals Patient Stated Goal: to go home PT Goal Formulation: With patient Time For Goal  Achievement: 06/13/15 Potential to Achieve Goals: Good    Frequency  7X/week    PT Plan Current plan remains appropriate    Co-evaluation             End of Session Equipment Utilized During Treatment: Gait belt Activity Tolerance: Patient tolerated treatment well Patient left: with call bell/phone within reach;in chair (Lt LE elevated for increased extension)     Time: [id number] PT Time Calculation (min) (ACUTE ONLY): 23 min  Charges:  $Gait Training: 23-37 mins $Therapeutic Exercise: 8-22 mins                    G CodesDarliss Cheney, PTA [phone] 06/07/2015, 2:26 PM

## 2015-06-07 NOTE — Progress Notes (Signed)
Physical Therapy Treatment Patient Details Name: Madison Stein MRN: [id number] DOB: Nov 20, 1950 Today's Date: 06/07/2015    History of Present Illness Pt admitted for L TKA. PMHx: bil THA, DM, fibromyalgia, depression, right carpal tunnel release    PT Comments    Patient progressing well toward PT goals with ability to ambulate 64ft and overall mobility level min guard/min A. Patient educated on HEP and importance of using bone foam. Paitient is eager to participate in therapy and current plan remains appropriate.  Follow Up Recommendations  Home health PT     Equipment Recommendations  None recommended by PT    Recommendations for Other Services       Precautions / Restrictions Precautions Precautions: Knee;Fall Precaution Comments: per PT eval, pt reports 4 falls in the last year Restrictions Weight Bearing Restrictions: Yes LLE Weight Bearing: Weight bearing as tolerated    Mobility  Bed Mobility Overal bed mobility: Needs Assistance Bed Mobility: Supine to Sit;Sit to Supine     Supine to sit: Supervision Sit to supine: Min assist   General bed mobility comments: up in chair upon arrival  Transfers Overall transfer level: Needs assistance Equipment used: Rolling walker (2 wheeled) Transfers: Sit to/from Stand Sit to Stand: Min assist         General transfer comment: min guard for stand to sit; from recliner; assistance to power up; carry over of correct hand placement/sequencing  Ambulation/Gait Ambulation/Gait assistance: Min guard Ambulation Distance (Feet): 80 Feet Assistive device: Rolling walker (2 wheeled) Gait Pattern/deviations: Step-through pattern;Decreased stance time - left;Decreased stride length Gait velocity: decreased   General Gait Details: cues for maintaining upright posture and encouragment for bearing more weight through Lt LE   Stairs            Wheelchair Mobility    Modified Rankin (Stroke Patients Only)        Balance Overall balance assessment: Needs assistance Sitting-balance support: Feet supported Sitting balance-Leahy Scale: Good     Standing balance support: No upper extremity supported;During functional activity Standing balance-Leahy Scale: Fair Standing balance comment: Pt able to stand at sink and wash hands with no UE supported                     Cognition Arousal/Alertness: Awake/alert Behavior During Therapy: WFL for tasks assessed/performed Overall Cognitive Status: Within Functional Limits for tasks assessed                      Exercises Total Joint Exercises Ankle Circles/Pumps: AROM;Both;20 reps Quad Sets: AROM;Left;10 reps Short Arc Quad: AROM;Left;10 reps Heel Slides: AROM;Left;10 reps Hip ABduction/ADduction: AROM;Left;10 reps Goniometric ROM: 3-55    General Comments        Pertinent Vitals/Pain Pain Assessment: 0-10 Pain Score: 2  Pain Location: Lt knee Pain Descriptors / Indicators: Sore Pain Intervention(s): Limited activity within patient's tolerance;Monitored during session;Premedicated before session    Home Living Family/patient expects to be discharged to:: Private residence Living Arrangements: Alone Available Help at Discharge: Family;Available 24 hours/day Type of Home: House Home Access: Stairs to enter Entrance Stairs-Rails: None Home Layout: One level Home Equipment: Shower seat - built in;Cane - single point;Bedside commode;Walker - 2 wheels;Adaptive equipment      Prior Function Level of Independence: Independent with assistive device(s)      Comments: cane for ambulation PTA   PT Goals (current goals can now be found in the care plan section) Acute Rehab PT Goals Patient Stated Goal: to  go home PT Goal Formulation: With patient Time For Goal Achievement: 06/13/15 Potential to Achieve Goals: Good    Frequency  7X/week    PT Plan      Co-evaluation             End of Session Equipment Utilized  During Treatment: Gait belt Activity Tolerance: Patient tolerated treatment well Patient left: in bed;with call bell/phone within reach (Lt LE elevated for increased extension)     Time: [id number] PT Time Calculation (min) (ACUTE ONLY): 35 min  Charges:  $Gait Training: 8-22 mins $Therapeutic Exercise: 8-22 mins                    G CodesDarliss Cheney, PTA [phone] 06/07/2015, 12:42 PM

## 2015-06-07 NOTE — Anesthesia Postprocedure Evaluation (Signed)
Anesthesia Post Note  Patient: Madison Stein  Procedure(s) Performed: Procedure(s) (LRB): TOTAL KNEE ARTHROPLASTY (Left)  Patient location during evaluation: PACU Anesthesia Type: Spinal and MAC Level of consciousness: awake and alert Pain management: pain level controlled Vital Signs Assessment: post-procedure vital signs reviewed and stable Respiratory status: spontaneous breathing and respiratory function stable Cardiovascular status: blood pressure returned to baseline and stable Postop Assessment: spinal receding Anesthetic complications: no    Last Vitals:  Filed Vitals:   06/07/15 0548 06/07/15 1400  BP: 129/56 113/52  Pulse: 108 109  Temp: 36.8 C 36.6 C  Resp: 18 16    Last Pain:  Filed Vitals:   06/07/15 1456  PainSc: Asleep    LLE Motor Response: Purposeful movement LLE Sensation: Pain, Full sensation RLE Motor Response: Purposeful movement RLE Sensation: Full sensation L Sensory Level: L2-Upper inner thigh, upper buttock R Sensory Level: L2-Upper inner thigh, upper buttock  Daje Grimmer DAVID

## 2015-06-07 NOTE — Addendum Note (Signed)
Addendum  created 06/07/15 1545 by Lillia Abed, MD   Modules edited: Clinical Notes, Notes Section   Clinical Notes:  File: [id number]   Notes Section:  Delete: [id number]

## 2015-06-08 LAB — CBC
HCT: 31 % — ABNORMAL LOW (ref 36.0–46.0)
HEMOGLOBIN: 9.5 g/dL — AB (ref 12.0–15.0)
MCH: 25.1 pg — AB (ref 26.0–34.0)
MCHC: 30.6 g/dL (ref 30.0–36.0)
MCV: 81.8 fL (ref 78.0–100.0)
PLATELETS: 208 10*3/uL (ref 150–400)
RBC: 3.79 MIL/uL — AB (ref 3.87–5.11)
RDW: 15.1 % (ref 11.5–15.5)
WBC: 10.1 10*3/uL (ref 4.0–10.5)

## 2015-06-08 LAB — GLUCOSE, CAPILLARY: GLUCOSE-CAPILLARY: 132 mg/dL — AB (ref 65–99)

## 2015-06-08 NOTE — Progress Notes (Signed)
PATIENT ID: ANEKA DANTONA  MRN: [id number]  DOB/AGE:  August 30, 1950 / 64 y.o.  2 Days Post-Op Procedure(s) (LRB): TOTAL KNEE ARTHROPLASTY (Left)    PROGRESS NOTE Subjective: Patient is alert, oriented, no Nausea, no Vomiting, yes passing gas. Taking PO with small bites. Denies SOB, Chest or Calf Pain. Using Incentive Spirometer, PAS in place. Ambulate WBAT with pt walking 130 ft, CPM 0-40 Patient reports pain as 8/10 with activity, mild with rest. .    Objective: Vital signs in last 24 hours: Filed Vitals:   06/07/15 0548 06/07/15 1400 06/07/15 2100 06/08/15 0501  BP: 129/56 113/52 146/60 130/54  Pulse: 108 109 116 112  Temp: 98.2 F (36.8 C) 97.8 F (36.6 C) 99.8 F (37.7 C) 98.7 F (37.1 C)  TempSrc: Oral  Oral Oral  Resp: 18 16 18 20   Height:      Weight:      SpO2: 95% 94% 99% 97%      Intake/Output from previous day: I/O last 3 completed shifts: In: 3007.5 [P.O.:720; I.V.:2287.5] Out: -    Intake/Output this shift:     LABORATORY DATA:  Recent Labs  06/07/15 0351  06/07/15 1154 06/07/15 1631 06/07/15 2247 06/08/15 0528  WBC 9.2  --   --   --   --  10.1  HGB 10.0*  --   --   --   --  9.5*  HCT 32.0*  --   --   --   --  31.0*  PLT 207  --   --   --   --  208  NA 135  --   --   --   --   --   K 4.4  --   --   --   --   --   CL 105  --   --   --   --   --   CO2 23  --   --   --   --   --   BUN 8  --   --   --   --   --   CREATININE 0.76  --   --   --   --   --   GLUCOSE 199*  --   --   --   --   --   GLUCAP  --   < > 204* 183* 152*  --   CALCIUM 8.5*  --   --   --   --   --   < > = values in this interval not displayed.  Examination: Neurologically intact Neurovascular intact Sensation intact distally Intact pulses distally Dorsiflexion/Plantar flexion intact Incision: dressing C/D/I No cellulitis present Compartment soft}  Assessment:   2 Days Post-Op Procedure(s) (LRB): TOTAL KNEE ARTHROPLASTY (Left) ADDITIONAL DIAGNOSIS: Expected Acute Blood  Loss Anemia, Diabetes  Plan: PT/OT WBAT, CPM 5/hrs day until ROM 0-90 degrees, then D/C CPM DVT Prophylaxis:  SCDx72hrs, ASA 325 mg BID x 2 weeks DISCHARGE PLAN: Home DISCHARGE NEEDS: HHPT, CPM, Walker and 3-in-1 comode seat     PHILLIPS, ERIC R 06/08/2015, 7:28 AM

## 2015-06-08 NOTE — Discharge Summary (Signed)
Patient ID: Madison Stein MRN: [id number] DOB/AGE: 1950-09-12 64 y.o.  Admit date: 06/06/2015 Discharge date: 06/08/2015  Admission Diagnoses:  Principal Problem:   Primary osteoarthritis of left knee Active Problems:   Arthritis of knee   Discharge Diagnoses:  Same  Past Medical History  Diagnosis Date  . Diabetes mellitus without complication ([place])     takes Metformin daily  . Depression     takes Cymbalta daily  . Hyperlipidemia     takes Fish Oil bid  . History of bronchitis 19yrs ago  . Joint pain   . Insomnia     has Ambien if needed  . Complication of anesthesia   . PONV (postoperative nausea and vomiting)   . Family history of anesthesia complication     mom and sister get very sick  . Pneumonia 23yrs ago    hx of  . Peripheral neuropathy ([facility])     takes Lyrica daily  . Family history of adverse reaction to anesthesia     patients mother and sister gets sick  . Pneumonia     hx of  . Depression   . Diabetes mellitus without complication ([place])     Type II  . Hyperlipidemia   . Bronchitis 2013    Hx of  . Thyroid nodule   . History of shingles   . Arthritis   . Fibromyalgia   . Urgency of urination     Leakage wears pad    Surgeries: Procedure(s): TOTAL KNEE ARTHROPLASTY on 06/06/2015   Consultants:    Discharged Condition: Improved  Hospital Course: Madison Stein is an 64 y.o. female who was admitted 06/06/2015 for operative treatment ofPrimary osteoarthritis of left knee. Patient has severe unremitting pain that affects sleep, daily activities, and work/hobbies. After pre-op clearance the patient was taken to the operating room on 06/06/2015 and underwent  Procedure(s): TOTAL KNEE ARTHROPLASTY.    Patient was given perioperative antibiotics: Anti-infectives    Start     Dose/Rate Route Frequency Ordered Stop   06/06/15 0600  ceFAZolin (ANCEF) IVPB 2 g/50 mL premix  Status:  Discontinued     2 g 100 mL/hr over 30 Minutes Intravenous To  ShortStay Surgical 06/05/15 1439 06/05/15 1440   06/06/15 0600  ceFAZolin (ANCEF) IVPB 2 g/50 mL premix     2 g 100 mL/hr over 30 Minutes Intravenous To ShortStay Surgical 06/05/15 1441 06/06/15 0743       Patient was given sequential compression devices, early ambulation, and chemoprophylaxis to prevent DVT.  Patient benefited maximally from hospital stay and there were no complications.    Recent vital signs: Patient Vitals for the past 24 hrs:  BP Temp Temp src Pulse Resp SpO2  06/08/15 0501 (!) 130/54 mmHg 98.7 F (37.1 C) Oral (!) 112 20 97 %  06/07/15 2100 (!) 146/60 mmHg 99.8 F (37.7 C) Oral (!) 116 18 99 %  06/07/15 1400 (!) 113/52 mmHg 97.8 F (36.6 C) - (!) 109 16 94 %     Recent laboratory studies:  Recent Labs  06/07/15 0351 06/08/15 0528  WBC 9.2 10.1  HGB 10.0* 9.5*  HCT 32.0* 31.0*  PLT 207 208  NA 135  --   K 4.4  --   CL 105  --   CO2 23  --   BUN 8  --   CREATININE 0.76  --   GLUCOSE 199*  --   CALCIUM 8.5*  --      Discharge Medications:  Medication List    TAKE these medications        ARTHROTEC 75-0.2 MG Tbec  Generic drug:  Diclofenac-Misoprostol  Take 1 tablet by mouth 2 (two) times daily as needed (pain).     aspirin EC 325 MG tablet  Take 1 tablet (325 mg total) by mouth 2 (two) times daily.     ciprofloxacin 500 MG tablet  Commonly known as:  CIPRO  Take 500 mg by mouth 2 (two) times daily. For 3 days 05-31-15 to 06-03-15     DULoxetine 60 MG capsule  Commonly known as:  CYMBALTA  Take 60 mg by mouth 2 (two) times daily.     fenofibrate 160 MG tablet  Take 160 mg by mouth daily.     HYDROcodone-acetaminophen 5-325 MG tablet  Commonly known as:  NORCO/VICODIN  Take 1 tablet by mouth 2 (two) times daily as needed for moderate pain.     methocarbamol 500 MG tablet  Commonly known as:  ROBAXIN  Take 1 tablet (500 mg total) by mouth every 6 (six) hours as needed for muscle spasms.     multivitamin with minerals Tabs  tablet  Take 1 tablet by mouth daily.     mupirocin ointment 2 %  Commonly known as:  BACTROBAN  Place 1 application into the nose 2 (two) times daily. Infection     omega-3 acid ethyl esters 1 G capsule  Commonly known as:  LOVAZA  Take 1 g by mouth 2 (two) times daily.     oxyCODONE-acetaminophen 5-325 MG tablet  Commonly known as:  ROXICET  Take 1 tablet by mouth every 4 (four) hours as needed.     pregabalin 150 MG capsule  Commonly known as:  LYRICA  Take 150 mg by mouth 2 (two) times daily.     TYLENOL ARTHRITIS EXT RELIEF PO  Take 2 tablets by mouth every 8 (eight) hours as needed (pain).     Vitamin D (Ergocalciferol) 50000 UNITS Caps capsule  Commonly known as:  DRISDOL  Take 50,000 Units by mouth 2 (two) times a week. Wednesday & Saturday     XIGDUO XR 11-998 MG Tb24  Generic drug:  Dapagliflozin-Metformin HCl ER  Take 1 tablet by mouth daily.        Diagnostic Studies: No results found.  Disposition: 06-Home-Health Care Svc      Discharge Instructions    CPM    Complete by:  As directed   Continuous passive motion machine (CPM):      Use the CPM from 0 to 60  for 5 hours per day.      You may increase by 10 degrees per day.  You may break it up into 2 or 3 sessions per day.      Use CPM for 2 weeks or until you are told to stop.     Call MD / Call 911    Complete by:  As directed   If you experience chest pain or shortness of breath, CALL 911 and be transported to the hospital emergency room.  If you develope a fever above 101 F, pus (white drainage) or increased drainage or redness at the wound, or calf pain, call your surgeon's office.     Change dressing    Complete by:  As directed   Change dressing on 5, then change the dressing daily with sterile 4 x 4 inch gauze dressing and apply TED hose.  You may clean the incision with alcohol prior  to redressing.     Constipation Prevention    Complete by:  As directed   Drink plenty of fluids.  Prune juice  may be helpful.  You may use a stool softener, such as Colace (over the counter) 100 mg twice a day.  Use MiraLax (over the counter) for constipation as needed.     Diet - low sodium heart healthy    Complete by:  As directed      Discharge instructions    Complete by:  As directed   Follow up in office with Dr. Mayer Camel in 2 weeks.     Driving restrictions    Complete by:  As directed   No driving for 2 weeks     Increase activity slowly as tolerated    Complete by:  As directed      Patient may shower    Complete by:  As directed   You may shower without a dressing once there is no drainage.  Do not wash over the wound.  If drainage remains, cover wound with plastic wrap and then shower.           Follow-up Information    Follow up with Kerin Salen, MD In 2 weeks.   Specialty:  Orthopedic Surgery   Contact information:   [place] [address] [phone]       Follow up with [facility].   Why:  They will contact you to schedule home therapy visits.    Contact information:   [place] [address] [phone]        Signed: Theodosia Quay 06/08/2015, 7:31 AM

## 2015-06-08 NOTE — Progress Notes (Signed)
Discharge instructions given and patient verbalized understating.

## 2015-06-08 NOTE — Progress Notes (Signed)
Physical Therapy Treatment Patient Details Name: TUYEN DOONER MRN: [id number] DOB: 1951-04-25 Today's Date: 06/08/2015    History of Present Illness Pt admitted for L TKA. PMHx: bil THA, DM, fibromyalgia, depression, right carpal tunnel release    PT Comments    Patient encouraged to perform HEP and OOB activities with supervision and to increase knee flexion stretching and use of bone foam. Patient overall supervision/min guard for transfers and gait and is making good progress toward PT goals. Continue progressing as tolerated with anticipated d/c home with home health PT.  Follow Up Recommendations  Home health PT     Equipment Recommendations  None recommended by PT    Recommendations for Other Services       Precautions / Restrictions Precautions Precautions: Knee;Fall Precaution Comments: per PT eval, pt reports 4 falls in the last year Restrictions Weight Bearing Restrictions: Yes LLE Weight Bearing: Weight bearing as tolerated    Mobility  Bed Mobility                  Transfers Overall transfer level: Needs assistance Equipment used: Rolling walker (2 wheeled) Transfers: Sit to/from Stand Sit to Stand: Supervision         General transfer comment: good carry over of correct hand placement and LE positioning; from EOB and BSC   Ambulation/Gait Ambulation/Gait assistance: Min guard Ambulation Distance (Feet): 150 Feet Assistive device: Rolling walker (2 wheeled) Gait Pattern/deviations: Step-through pattern;Decreased stride length;Antalgic Gait velocity: decreased   General Gait Details: cues for increased Lt knee flexion   Stairs            Wheelchair Mobility    Modified Rankin (Stroke Patients Only)       Balance Overall balance assessment: Needs assistance Sitting-balance support: Feet supported Sitting balance-Leahy Scale: Good     Standing balance support: No upper extremity supported Standing balance-Leahy Scale:  Fair                      Cognition Arousal/Alertness: Awake/alert Behavior During Therapy: WFL for tasks assessed/performed Overall Cognitive Status: Within Functional Limits for tasks assessed                      Exercises      General Comments General comments (skin integrity, edema, etc.): pt educated about assisted Lt knee flexion stretch with sheet with pt demonstration; encoragement to use bone foam       Pertinent Vitals/Pain Pain Assessment: 0-10 Pain Score: 8  Pain Location: Lt LE Pain Descriptors / Indicators: Sore Pain Intervention(s): Limited activity within patient's tolerance;Monitored during session;Premedicated before session;Repositioned    Home Living                      Prior Function            PT Goals (current goals can now be found in the care plan section) Acute Rehab PT Goals Patient Stated Goal: to go home PT Goal Formulation: With patient Time For Goal Achievement: 06/13/15 Potential to Achieve Goals: Good    Frequency  7X/week    PT Plan Current plan remains appropriate    Co-evaluation             End of Session Equipment Utilized During Treatment: Gait belt Activity Tolerance: Patient tolerated treatment well Patient left: with call bell/phone within reach;in chair     Time: 1356-1420 PT Time Calculation (min) (ACUTE ONLY): 24 min  Charges:  $  Gait Training: 8-22 mins $Therapeutic Activity: 8-22 mins                    G Codes:      Darliss Cheney, PTA [phone] 06/08/2015, 2:57 PM

## 2015-06-08 NOTE — Progress Notes (Signed)
Physical Therapy Treatment Patient Details Name: Madison Stein MRN: [id number] DOB: Feb 12, 1951 Today's Date: 06/08/2015    History of Present Illness Pt admitted for L TKA. PMHx: bil THA, DM, fibromyalgia, depression, right carpal tunnel release    PT Comments    Patient progressing well toward PT goals with ambulation of 150 ft and overall mobility level supervision/min guard this session. Current plan remains appropriate. Continue to progress patient as tolerated.  Follow Up Recommendations  Home health PT     Equipment Recommendations  None recommended by PT    Recommendations for Other Services       Precautions / Restrictions Precautions Precautions: Knee;Fall Precaution Comments: per PT eval, pt reports 4 falls in the last year Restrictions Weight Bearing Restrictions: Yes LLE Weight Bearing: Weight bearing as tolerated    Mobility  Bed Mobility Overal bed mobility: Needs Assistance Bed Mobility: Supine to Sit     Supine to sit: Min guard     General bed mobility comments: required use of bed rails; HOB flat; cues for sequencing and extra time for advancing L LE to EOB  Transfers Overall transfer level: Needs assistance Equipment used: Rolling walker (2 wheeled) Transfers: Sit to/from Stand Sit to Stand: Supervision         General transfer comment: good carry over of correct hand placement and LE positioning; from EOB and BSC   Ambulation/Gait Ambulation/Gait assistance: Min guard Ambulation Distance (Feet): 150 Feet Assistive device: Rolling walker (2 wheeled) Gait Pattern/deviations: Step-through pattern;Decreased stride length;Decreased stance time - left Gait velocity: decreased   General Gait Details: cues for maintaining upright posture and increased extension of Lt knee; encouragment for bearing more weight through Lt LE; increased cadence and Lt knee flexion after first 57ft of ambulation   Stairs            Wheelchair Mobility     Modified Rankin (Stroke Patients Only)       Balance Overall balance assessment: Needs assistance Sitting-balance support: Feet supported Sitting balance-Leahy Scale: Good     Standing balance support: No upper extremity supported Standing balance-Leahy Scale: Fair                      Cognition Arousal/Alertness: Awake/alert Behavior During Therapy: WFL for tasks assessed/performed Overall Cognitive Status: Within Functional Limits for tasks assessed                      Exercises Total Joint Exercises Ankle Circles/Pumps: AROM;Both;15 reps Quad Sets: AROM;Left;10 reps Heel Slides: AROM;Left;10 reps;Supine Long Arc Quad: AROM;Left;10 reps;Seated Goniometric ROM: 3-70 (measurement taken in supine)    General Comments        Pertinent Vitals/Pain Pain Assessment: 0-10 Pain Score: 6  Pain Location: Lt anterior and lateral thigh Pain Descriptors / Indicators: Sore Pain Intervention(s): Limited activity within patient's tolerance;Monitored during session;Premedicated before session;Repositioned    Home Living                      Prior Function            PT Goals (current goals can now be found in the care plan section) Acute Rehab PT Goals Patient Stated Goal: to go home PT Goal Formulation: With patient Time For Goal Achievement: 06/13/15 Potential to Achieve Goals: Good    Frequency  7X/week    PT Plan Current plan remains appropriate    Co-evaluation  End of Session Equipment Utilized During Treatment: Gait belt Activity Tolerance: Patient tolerated treatment well Patient left: with call bell/phone within reach;in chair     Time: [id number] PT Time Calculation (min) (ACUTE ONLY): 31 min  Charges:  $Gait Training: 8-22 mins $Therapeutic Exercise: 8-22 mins                    G CodesDarliss Cheney, PTA [phone] 06/08/2015, 10:16 AM

## 2015-11-05 IMAGING — CR DG PORTABLE PELVIS
1 series · 1 of 1 positions shown · non-contrast
Comparison: None.

CLINICAL DATA: Status post right total hip arthroplasty.

EXAM:
PORTABLE PELVIS 1-2 VIEWS

[ap]
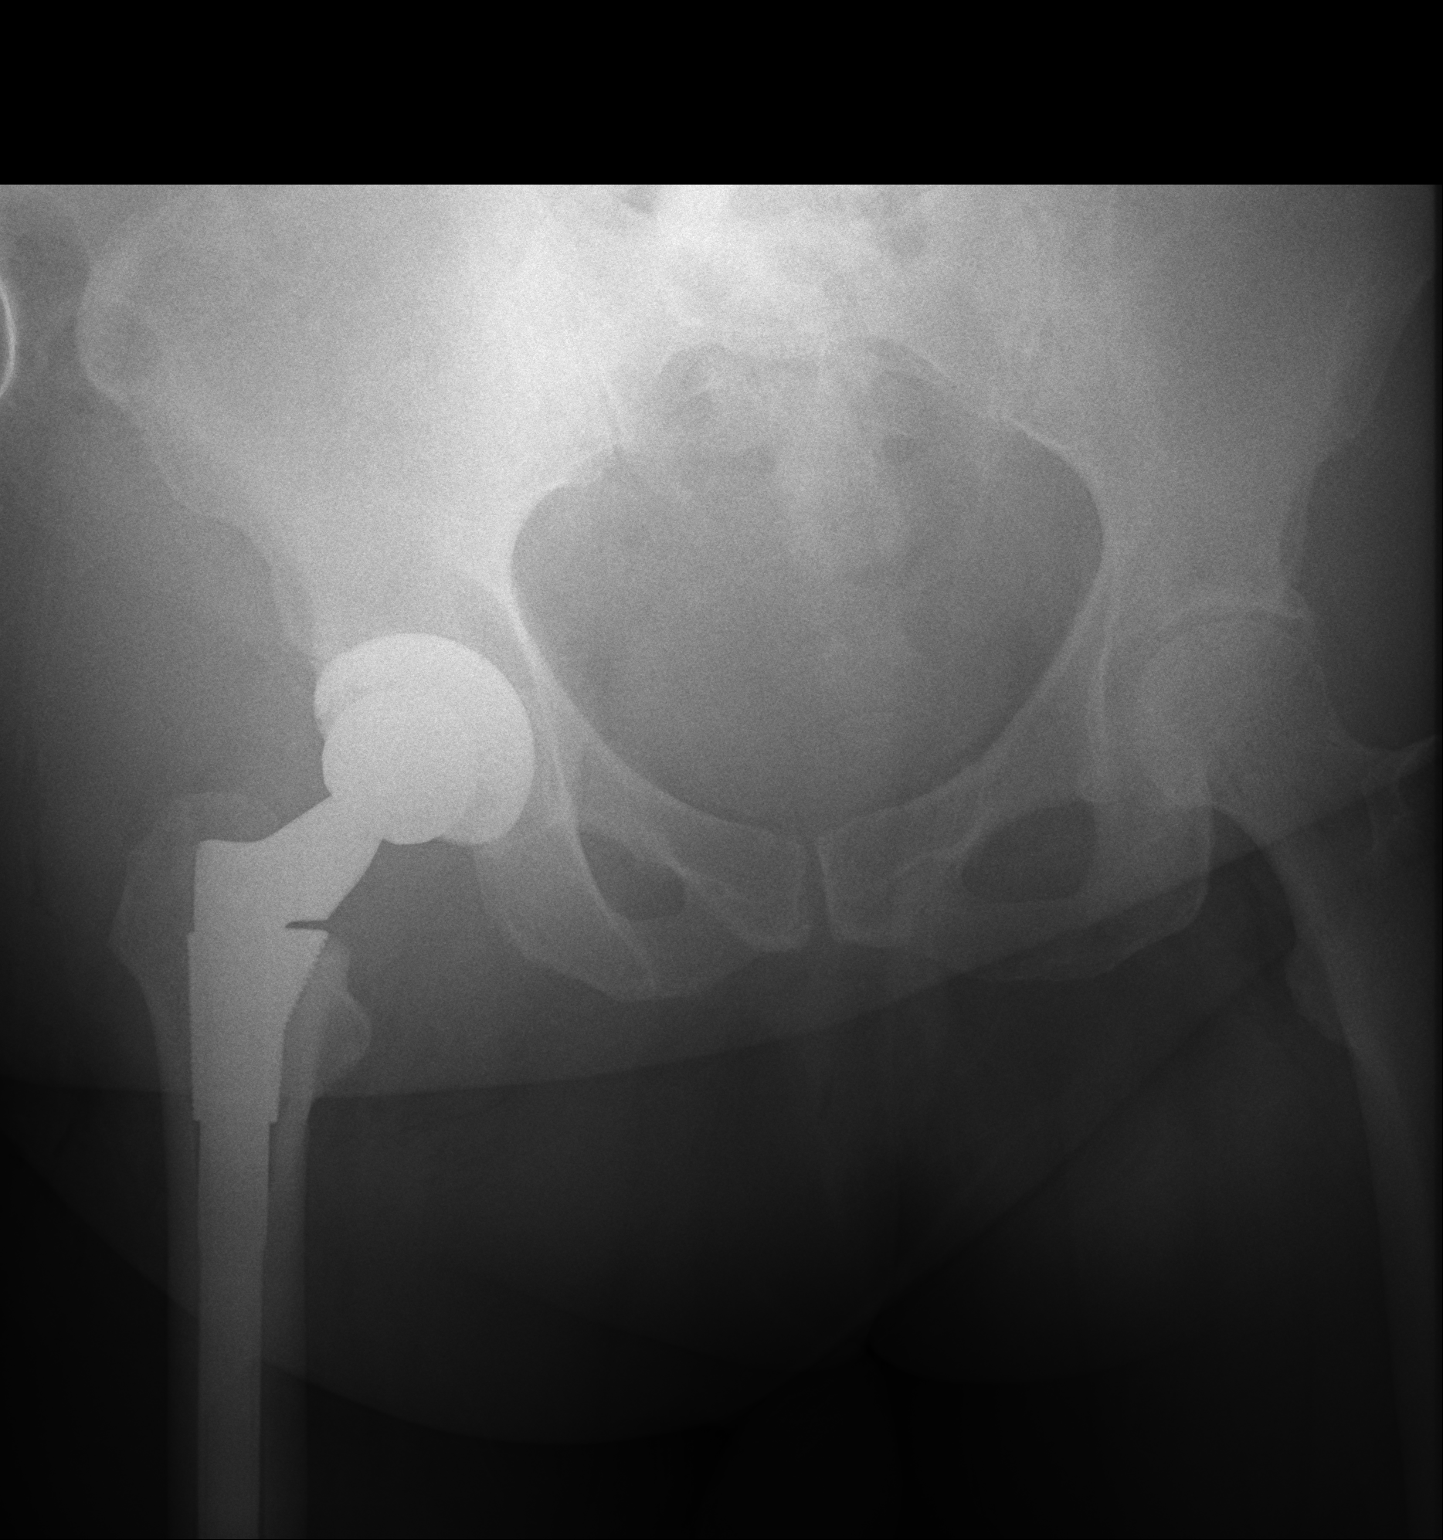

[1 of 1 positions shown; findings below may reference images not displayed]

FINDINGS: Status post right total hip arthroplasty. The femoral and acetabular
components are well situated. No acute fracture or dislocation is
noted.
IMPRESSION: Status post right total hip arthroplasty.

## 2016-07-12 DIAGNOSIS — J00 Acute nasopharyngitis [common cold]: Secondary | ICD-10-CM | POA: Diagnosis not present

## 2016-08-01 DIAGNOSIS — M7062 Trochanteric bursitis, left hip: Secondary | ICD-10-CM | POA: Diagnosis not present

## 2016-08-01 DIAGNOSIS — M545 Low back pain: Secondary | ICD-10-CM | POA: Diagnosis not present

## 2016-08-14 IMAGING — CR DG PORTABLE PELVIS
1 series · 1 of 1 positions shown · non-contrast
Comparison: 11/20/2013

CLINICAL DATA: Left hip replacement.

EXAM:
PORTABLE PELVIS 1-2 VIEWS

[AP]
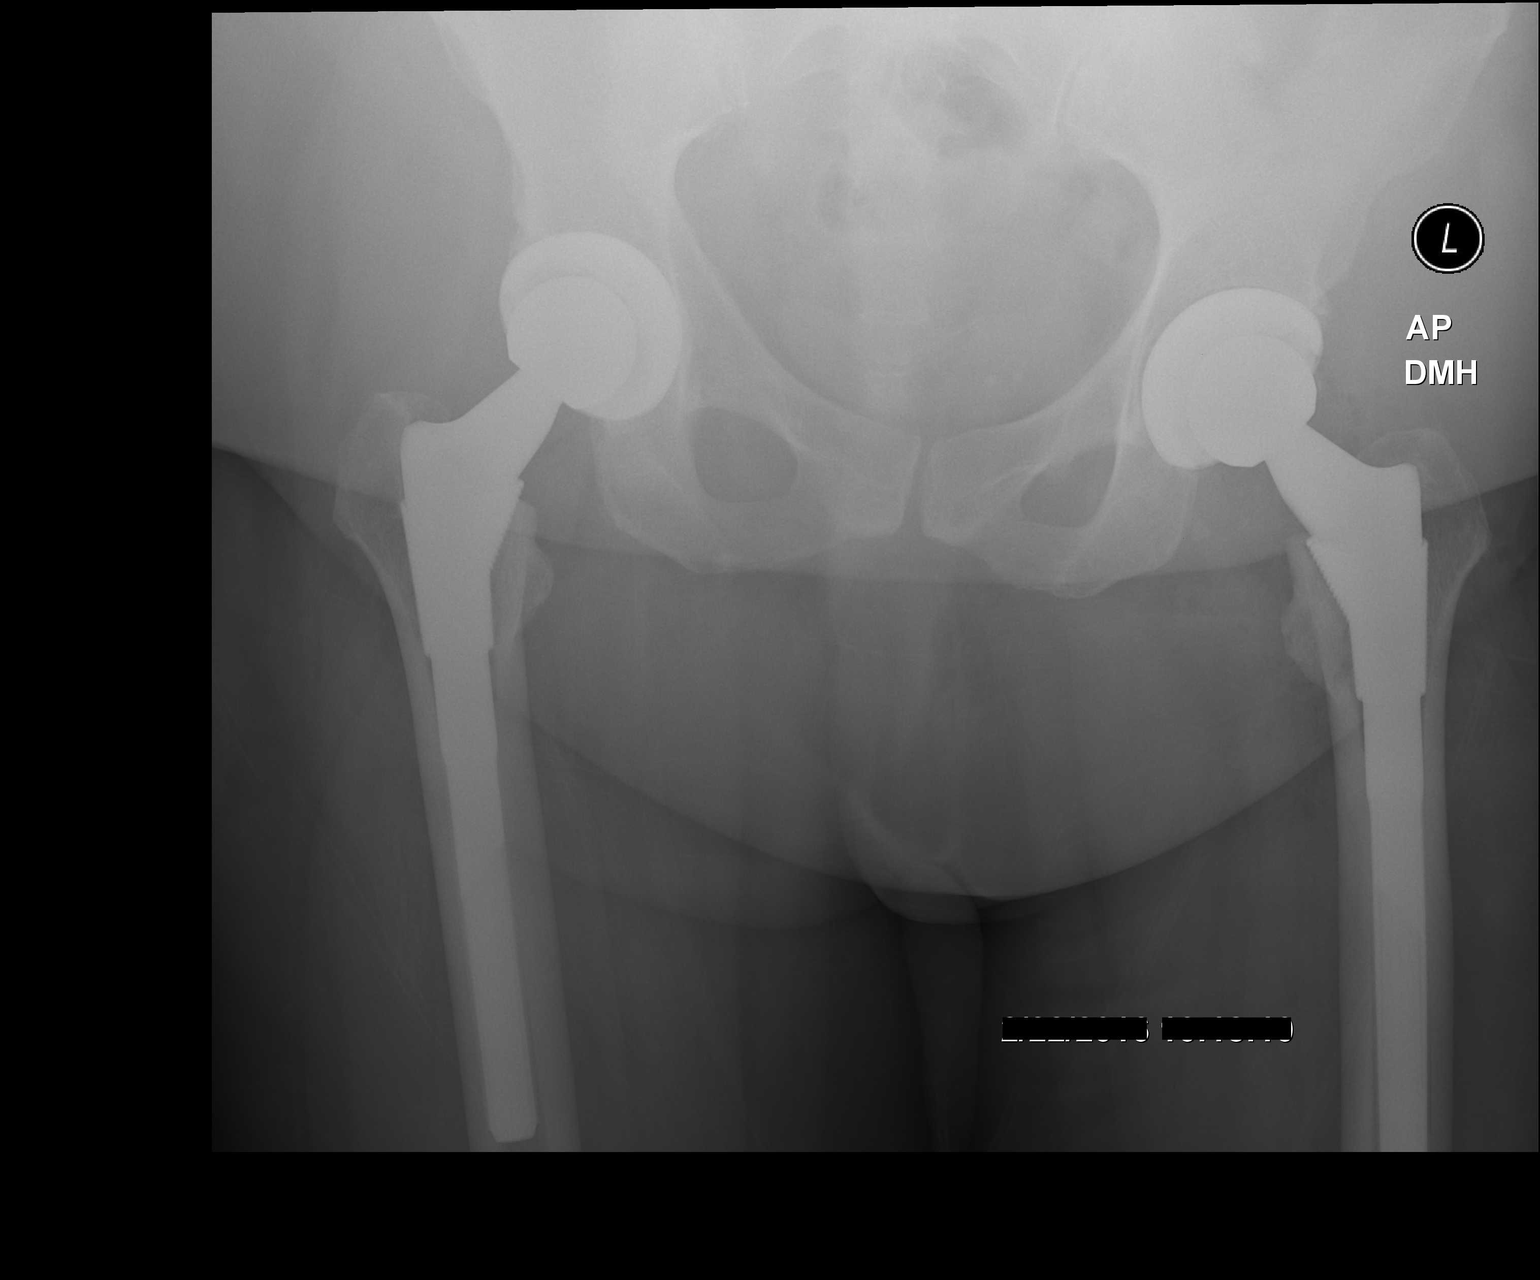

[1 of 1 positions shown; findings below may reference images not displayed]

FINDINGS: Single view demonstrates a left hip arthroplasty. The entire left
femoral stem is visualized. No evidence for a periprosthetic
fracture or hardware complication. Again noted is the right hip
arthroplasty without complicating features. The pelvic bony ring is
intact.
IMPRESSION: Left hip arthroplasty.

## 2016-08-21 DIAGNOSIS — E1142 Type 2 diabetes mellitus with diabetic polyneuropathy: Secondary | ICD-10-CM | POA: Diagnosis not present

## 2016-08-21 DIAGNOSIS — F411 Generalized anxiety disorder: Secondary | ICD-10-CM | POA: Diagnosis not present

## 2016-08-21 DIAGNOSIS — E871 Hypo-osmolality and hyponatremia: Secondary | ICD-10-CM | POA: Diagnosis not present

## 2016-08-21 DIAGNOSIS — E781 Pure hyperglyceridemia: Secondary | ICD-10-CM | POA: Diagnosis not present

## 2016-09-19 DIAGNOSIS — L820 Inflamed seborrheic keratosis: Secondary | ICD-10-CM | POA: Diagnosis not present

## 2016-09-19 DIAGNOSIS — D225 Melanocytic nevi of trunk: Secondary | ICD-10-CM | POA: Diagnosis not present

## 2016-10-02 DIAGNOSIS — M7062 Trochanteric bursitis, left hip: Secondary | ICD-10-CM | POA: Diagnosis not present

## 2016-10-04 DIAGNOSIS — M5412 Radiculopathy, cervical region: Secondary | ICD-10-CM | POA: Diagnosis not present

## 2016-11-29 DIAGNOSIS — F418 Other specified anxiety disorders: Secondary | ICD-10-CM | POA: Diagnosis not present

## 2016-11-29 DIAGNOSIS — Z1211 Encounter for screening for malignant neoplasm of colon: Secondary | ICD-10-CM | POA: Diagnosis not present

## 2016-11-29 DIAGNOSIS — E781 Pure hyperglyceridemia: Secondary | ICD-10-CM | POA: Diagnosis not present

## 2016-11-29 DIAGNOSIS — M353 Polymyalgia rheumatica: Secondary | ICD-10-CM | POA: Diagnosis not present

## 2016-11-29 DIAGNOSIS — E1142 Type 2 diabetes mellitus with diabetic polyneuropathy: Secondary | ICD-10-CM | POA: Diagnosis not present

## 2017-01-15 DIAGNOSIS — M5416 Radiculopathy, lumbar region: Secondary | ICD-10-CM | POA: Diagnosis not present

## 2017-01-15 DIAGNOSIS — M545 Low back pain: Secondary | ICD-10-CM | POA: Diagnosis not present

## 2017-01-18 DIAGNOSIS — M545 Low back pain: Secondary | ICD-10-CM | POA: Diagnosis not present

## 2017-01-21 DIAGNOSIS — M5416 Radiculopathy, lumbar region: Secondary | ICD-10-CM | POA: Diagnosis not present

## 2017-01-29 DIAGNOSIS — M5416 Radiculopathy, lumbar region: Secondary | ICD-10-CM | POA: Diagnosis not present

## 2017-02-06 DIAGNOSIS — M5417 Radiculopathy, lumbosacral region: Secondary | ICD-10-CM | POA: Diagnosis not present

## 2017-02-06 DIAGNOSIS — M545 Low back pain: Secondary | ICD-10-CM | POA: Diagnosis not present

## 2017-02-08 DIAGNOSIS — M5417 Radiculopathy, lumbosacral region: Secondary | ICD-10-CM | POA: Diagnosis not present

## 2017-02-08 DIAGNOSIS — M545 Low back pain: Secondary | ICD-10-CM | POA: Diagnosis not present

## 2017-02-12 DIAGNOSIS — M542 Cervicalgia: Secondary | ICD-10-CM | POA: Diagnosis not present

## 2017-02-12 DIAGNOSIS — M5416 Radiculopathy, lumbar region: Secondary | ICD-10-CM | POA: Diagnosis not present

## 2017-03-26 DIAGNOSIS — M5416 Radiculopathy, lumbar region: Secondary | ICD-10-CM | POA: Diagnosis not present

## 2017-04-22 DIAGNOSIS — M5412 Radiculopathy, cervical region: Secondary | ICD-10-CM | POA: Diagnosis not present

## 2017-05-03 DIAGNOSIS — E781 Pure hyperglyceridemia: Secondary | ICD-10-CM | POA: Diagnosis not present

## 2017-05-03 DIAGNOSIS — F418 Other specified anxiety disorders: Secondary | ICD-10-CM | POA: Diagnosis not present

## 2017-05-03 DIAGNOSIS — E1142 Type 2 diabetes mellitus with diabetic polyneuropathy: Secondary | ICD-10-CM | POA: Diagnosis not present

## 2017-05-03 DIAGNOSIS — M353 Polymyalgia rheumatica: Secondary | ICD-10-CM | POA: Diagnosis not present

## 2017-05-03 DIAGNOSIS — Z23 Encounter for immunization: Secondary | ICD-10-CM | POA: Diagnosis not present

## 2017-05-14 DIAGNOSIS — M5412 Radiculopathy, cervical region: Secondary | ICD-10-CM | POA: Diagnosis not present

## 2017-05-17 DIAGNOSIS — Z96652 Presence of left artificial knee joint: Secondary | ICD-10-CM | POA: Diagnosis not present

## 2017-05-17 DIAGNOSIS — Z09 Encounter for follow-up examination after completed treatment for conditions other than malignant neoplasm: Secondary | ICD-10-CM | POA: Diagnosis not present

## 2017-05-17 DIAGNOSIS — M1711 Unilateral primary osteoarthritis, right knee: Secondary | ICD-10-CM | POA: Diagnosis not present

## 2017-05-17 DIAGNOSIS — M25562 Pain in left knee: Secondary | ICD-10-CM | POA: Diagnosis not present

## 2017-07-09 HISTORY — PX: HAMMER TOE SURGERY: SHX385

## 2017-08-05 DIAGNOSIS — E781 Pure hyperglyceridemia: Secondary | ICD-10-CM | POA: Diagnosis not present

## 2017-08-05 DIAGNOSIS — M67442 Ganglion, left hand: Secondary | ICD-10-CM | POA: Diagnosis not present

## 2017-08-05 DIAGNOSIS — E1142 Type 2 diabetes mellitus with diabetic polyneuropathy: Secondary | ICD-10-CM | POA: Diagnosis not present

## 2017-08-05 DIAGNOSIS — Z1231 Encounter for screening mammogram for malignant neoplasm of breast: Secondary | ICD-10-CM | POA: Diagnosis not present

## 2017-08-05 DIAGNOSIS — F418 Other specified anxiety disorders: Secondary | ICD-10-CM | POA: Diagnosis not present

## 2017-08-05 DIAGNOSIS — M353 Polymyalgia rheumatica: Secondary | ICD-10-CM | POA: Diagnosis not present

## 2017-08-09 DIAGNOSIS — M1711 Unilateral primary osteoarthritis, right knee: Secondary | ICD-10-CM | POA: Diagnosis not present

## 2017-08-22 DIAGNOSIS — M67442 Ganglion, left hand: Secondary | ICD-10-CM | POA: Diagnosis not present

## 2017-08-23 DIAGNOSIS — N959 Unspecified menopausal and perimenopausal disorder: Secondary | ICD-10-CM | POA: Diagnosis not present

## 2017-08-23 DIAGNOSIS — Z13820 Encounter for screening for osteoporosis: Secondary | ICD-10-CM | POA: Diagnosis not present

## 2017-08-23 DIAGNOSIS — Z1231 Encounter for screening mammogram for malignant neoplasm of breast: Secondary | ICD-10-CM | POA: Diagnosis not present

## 2017-08-23 LAB — HM DEXA SCAN: HM Dexa Scan: NORMAL

## 2017-08-27 ENCOUNTER — Other Ambulatory Visit: Payer: Self-pay | Admitting: Orthopedic Surgery

## 2017-09-03 ENCOUNTER — Other Ambulatory Visit: Payer: Self-pay | Admitting: Orthopedic Surgery

## 2017-09-11 NOTE — Pre-Procedure Instructions (Signed)
Madison Stein  09/11/2017      [facility], [place] - [address] AT [address] [place] [address] Phone: [phone] Fax: [phone]    Your procedure is scheduled on March 18  Report to [facility] at 1030 A.M.  Call this number if you have problems the morning of surgery:  [phone]   Remember:  Do not eat food or drink liquids after midnight.  Take these medicines the morning of surgery with A SIP OF WATER Duloxetine (Cynbalta),Gabapentin (Neurontin), Methocarbamol (Robaxin ) if needed  Stop taking aspirin, BC's, Goody's, herbal medications, Fish Oil, Aleve, Ibuprofen, Advil, Motrin, Vitamins, Diclofenac-Miscoprostol    How to Manage Your Diabetes Before and After Surgery  Why is it important to control my blood sugar before and after surgery? . Improving blood sugar levels before and after surgery helps healing and can limit problems. . A way of improving blood sugar control is eating a healthy diet by: o  Eating less sugar and carbohydrates o  Increasing activity/exercise o  Talking with your doctor about reaching your blood sugar goals . High blood sugars (greater than 180 mg/dL) can raise your risk of infections and slow your recovery, so you will need to focus on controlling your diabetes during the weeks before surgery. . Make sure that the doctor who takes care of your diabetes knows about your planned surgery including the date and location.  How do I manage my blood sugar before surgery? . Check your blood sugar at least 4 times a day, starting 2 days before surgery, to make sure that the level is not too high or low. o Check your blood sugar the morning of your surgery when you wake up and every 2 hours until you get to the Short Stay unit. . If your blood sugar is less than 70 mg/dL, you will need to treat for low blood sugar: o Do not take insulin. o Treat a low blood sugar  (less than 70 mg/dL) with  cup of clear juice (cranberry or apple), 4 glucose tablets, OR glucose gel. Recheck blood sugar in 15 minutes after treatment (to make sure it is greater than 70 mg/dL). If your blood sugar is not greater than 70 mg/dL on recheck, call [phone] o  for further instructions. . Report your blood sugar to the short stay nurse when you get to Short Stay.  . If you are admitted to the hospital after surgery: o Your blood sugar will be checked by the staff and you will probably be given insulin after surgery (instead of oral diabetes medicines) to make sure you have good blood sugar levels. o The goal for blood sugar control after surgery is 80-180 mg/dL.              WHAT DO I DO ABOUT MY DIABETES MEDICATION?   Marland Kitchen Do not take oral diabetes medicines (pills) the morning of surgery. Glimepiride (Amaryl) and Sitagliptin-Metformin (janumet-XR)  . The day of surgery, do not take other diabetes injectables, including Byetta (exenatide), Bydureon (exenatide ER), Victoza (liraglutide), or Trulicity (dulaglutide).  . If your CBG is greater than 220 mg/dL, you may take  of your sliding scale (correction) dose of insulin.  Other Instructions:          Patient Signature:  Date:   Nurse Signature:  Date:   Reviewed and Endorsed by [facility] Patient Education Committee, August 2015  Do not wear jewelry, make-up or nail polish.  Do not wear lotions, powders, or perfumes, or deodorant.  Do not shave 48 hours prior to surgery.  Men may shave face and neck.  Do not bring valuables to the hospital.  [facility] is not responsible for any belongings or valuables.  Contacts, dentures or bridgework may not be worn into surgery.  Leave your suitcase in the car.  After surgery it may be brought to your room.  For patients admitted to the hospital, discharge time will be determined by your treatment team.  Patients discharged the day of surgery will not be  allowed to drive home.   Special instructions:  [facility]- Preparing For Surgery  Before surgery, you can play an important role. Because skin is not sterile, your skin needs to be as free of germs as possible. You can reduce the number of germs on your skin by washing with CHG (chlorahexidine gluconate) Soap before surgery.  CHG is an antiseptic cleaner which kills germs and bonds with the skin to continue killing germs even after washing.  Please do not use if you have an allergy to CHG or antibacterial soaps. If your skin becomes reddened/irritated stop using the CHG.  Do not shave (including legs and underarms) for at least 48 hours prior to first CHG shower. It is OK to shave your face.  Please follow these instructions carefully.   1. Shower the NIGHT BEFORE SURGERY and the MORNING OF SURGERY with CHG.   2. If you chose to wash your hair, wash your hair first as usual with your normal shampoo.  3. After you shampoo, rinse your hair and body thoroughly to remove the shampoo.  4. Use CHG as you would any other liquid soap. You can apply CHG directly to the skin and wash gently with a scrungie or a clean washcloth.   5. Apply the CHG Soap to your body ONLY FROM THE NECK DOWN.  Do not use on open wounds or open sores. Avoid contact with your eyes, ears, mouth and genitals (private parts). Wash Face and genitals (private parts)  with your normal soap.  6. Wash thoroughly, paying special attention to the area where your surgery will be performed.  7. Thoroughly rinse your body with warm water from the neck down.  8. DO NOT shower/wash with your normal soap after using and rinsing off the CHG Soap.  9. Pat yourself dry with a CLEAN TOWEL.  10. Wear CLEAN PAJAMAS to bed the night before surgery, wear comfortable clothes the morning of surgery  11. Place CLEAN SHEETS on your bed the night of your first shower and DO NOT SLEEP WITH PETS.    Day of Surgery: Do not apply any  deodorants/lotions. Please wear clean clothes to the hospital/surgery center.       Please read over the following fact sheets that you were given. Pain Booklet, Coughing and Deep Breathing, MRSA Information and Surgical Site Infection Prevention

## 2017-09-12 ENCOUNTER — Other Ambulatory Visit: Payer: Self-pay

## 2017-09-12 ENCOUNTER — Encounter (HOSPITAL_COMMUNITY)
Admission: RE | Admit: 2017-09-12 | Discharge: 2017-09-12 | Disposition: A | Discharge status: Home / Self Care | Payer: PPO | Source: Ambulatory Visit | Attending: Orthopedic Surgery | Admitting: Orthopedic Surgery

## 2017-09-12 ENCOUNTER — Encounter (HOSPITAL_COMMUNITY): Payer: Self-pay

## 2017-09-12 DIAGNOSIS — Z01818 Encounter for other preprocedural examination: Secondary | ICD-10-CM

## 2017-09-12 DIAGNOSIS — G629 Polyneuropathy, unspecified: Secondary | ICD-10-CM | POA: Diagnosis not present

## 2017-09-12 DIAGNOSIS — F329 Major depressive disorder, single episode, unspecified: Secondary | ICD-10-CM | POA: Diagnosis not present

## 2017-09-12 DIAGNOSIS — G4700 Insomnia, unspecified: Secondary | ICD-10-CM | POA: Diagnosis not present

## 2017-09-12 DIAGNOSIS — Z87891 Personal history of nicotine dependence: Secondary | ICD-10-CM | POA: Diagnosis not present

## 2017-09-12 DIAGNOSIS — E119 Type 2 diabetes mellitus without complications: Secondary | ICD-10-CM | POA: Insufficient documentation

## 2017-09-12 DIAGNOSIS — Z79899 Other long term (current) drug therapy: Secondary | ICD-10-CM | POA: Insufficient documentation

## 2017-09-12 DIAGNOSIS — Z7984 Long term (current) use of oral hypoglycemic drugs: Secondary | ICD-10-CM | POA: Insufficient documentation

## 2017-09-12 DIAGNOSIS — Z01810 Encounter for preprocedural cardiovascular examination: Secondary | ICD-10-CM | POA: Insufficient documentation

## 2017-09-12 DIAGNOSIS — E785 Hyperlipidemia, unspecified: Secondary | ICD-10-CM | POA: Insufficient documentation

## 2017-09-12 DIAGNOSIS — M1711 Unilateral primary osteoarthritis, right knee: Secondary | ICD-10-CM | POA: Insufficient documentation

## 2017-09-12 LAB — CBC WITH DIFFERENTIAL/PLATELET
BASOS ABS: 0 10*3/uL (ref 0.0–0.1)
Basophils Relative: 0 %
EOS ABS: 0.3 10*3/uL (ref 0.0–0.7)
EOS PCT: 3 %
HEMATOCRIT: 40.6 % (ref 36.0–46.0)
Hemoglobin: 13.1 g/dL (ref 12.0–15.0)
Lymphocytes Relative: 25 %
Lymphs Abs: 2.4 10*3/uL (ref 0.7–4.0)
MCH: 27.4 pg (ref 26.0–34.0)
MCHC: 32.3 g/dL (ref 30.0–36.0)
MCV: 84.9 fL (ref 78.0–100.0)
MONO ABS: 0.6 10*3/uL (ref 0.1–1.0)
MONOS PCT: 6 %
Neutro Abs: 6.3 10*3/uL (ref 1.7–7.7)
Neutrophils Relative %: 66 %
PLATELETS: 280 10*3/uL (ref 150–400)
RBC: 4.78 MIL/uL (ref 3.87–5.11)
RDW: 14 % (ref 11.5–15.5)
WBC: 9.6 10*3/uL (ref 4.0–10.5)

## 2017-09-12 LAB — URINALYSIS, ROUTINE W REFLEX MICROSCOPIC
Bilirubin Urine: NEGATIVE
Glucose, UA: NEGATIVE mg/dL
HGB URINE DIPSTICK: NEGATIVE
Ketones, ur: NEGATIVE mg/dL
LEUKOCYTES UA: NEGATIVE
Nitrite: NEGATIVE
PROTEIN: NEGATIVE mg/dL
SPECIFIC GRAVITY, URINE: 1.027 (ref 1.005–1.030)
pH: 5 (ref 5.0–8.0)

## 2017-09-12 LAB — PROTIME-INR
INR: 0.98
Prothrombin Time: 12.9 seconds (ref 11.4–15.2)

## 2017-09-12 LAB — BASIC METABOLIC PANEL
ANION GAP: 12 (ref 5–15)
BUN: 22 mg/dL — AB (ref 6–20)
CALCIUM: 9.3 mg/dL (ref 8.9–10.3)
CO2: 23 mmol/L (ref 22–32)
CREATININE: 0.98 mg/dL (ref 0.44–1.00)
Chloride: 104 mmol/L (ref 101–111)
GFR calc Af Amer: >60 mL/min (ref >60)
GFR, EST NON AFRICAN AMERICAN: 60 mL/min — AB (ref >60)
GLUCOSE: 137 mg/dL — AB (ref 65–99)
Potassium: 4.5 mmol/L (ref 3.5–5.1)
Sodium: 139 mmol/L (ref 135–145)

## 2017-09-12 LAB — TYPE AND SCREEN
ABO/RH(D): O POS
Antibody Screen: NEGATIVE

## 2017-09-12 LAB — APTT: APTT: 33 s (ref 24–36)

## 2017-09-12 LAB — GLUCOSE, CAPILLARY: GLUCOSE-CAPILLARY: 159 mg/dL — AB (ref 65–99)

## 2017-09-12 LAB — SURGICAL PCR SCREEN
MRSA, PCR: NEGATIVE
STAPHYLOCOCCUS AUREUS: NEGATIVE

## 2017-09-12 LAB — HEMOGLOBIN A1C
Hgb A1c MFr Bld: 6.3 % — ABNORMAL HIGH (ref 4.8–5.6)
Mean Plasma Glucose: 134.11 mg/dL

## 2017-09-12 NOTE — Progress Notes (Signed)
PCP is Awilda Metro, PA-C Denies ever seeing a cardiologist Denies having a stress test, echo, or card cath. Denies chest pain, cough, or fever  Reports fasting CBG's run 109-120's.

## 2017-09-16 ENCOUNTER — Other Ambulatory Visit: Payer: Self-pay | Admitting: Orthopedic Surgery

## 2017-09-17 DIAGNOSIS — M25511 Pain in right shoulder: Secondary | ICD-10-CM | POA: Diagnosis not present

## 2017-09-17 DIAGNOSIS — M25561 Pain in right knee: Secondary | ICD-10-CM | POA: Diagnosis not present

## 2017-09-20 DIAGNOSIS — M1711 Unilateral primary osteoarthritis, right knee: Secondary | ICD-10-CM | POA: Diagnosis present

## 2017-09-20 NOTE — H&P (Signed)
TOTAL KNEE ADMISSION H&P  Patient is being admitted for right total knee arthroplasty.  Subjective:  Chief Complaint:right knee pain.  HPI: Madison Stein, 67 y.o. female, has a history of pain and functional disability in the right knee due to arthritis and has failed non-surgical conservative treatments for greater than 12 weeks to includeNSAID's and/or analgesics, corticosteriod injections, flexibility and strengthening excercises, use of assistive devices and activity modification.  Onset of symptoms was gradual, starting 2 years ago with gradually worsening course since that time. The patient noted no past surgery on the right knee(s).  Patient currently rates pain in the right knee(s) at 10 out of 10 with activity. Patient has night pain, worsening of pain with activity and weight bearing, pain that interferes with activities of daily living, pain with passive range of motion, crepitus and joint swelling.  Patient has evidence of joint subluxation and joint space narrowing by imaging studies.  There is no active infection.  Patient Active Problem List   Diagnosis Date Noted  . Arthritis of knee 06/06/2015  . Primary osteoarthritis of left knee 06/05/2015  . Arthritis, hip 08/30/2014  . Primary osteoarthritis of left hip 08/29/2014  . Arthritis of right hip 11/20/2013   Past Medical History:  Diagnosis Date  . Arthritis   . Bronchitis 2013   Hx of  . Complication of anesthesia   . Depression    takes Cymbalta daily  . Depression   . Diabetes mellitus without complication ([place])    takes Metformin daily  . Diabetes mellitus without complication ([place])    Type II  . Family history of adverse reaction to anesthesia    patients mother and sister gets sick  . Family history of anesthesia complication    mom and sister get very sick  . Fibromyalgia   . History of bronchitis 87yrs ago  . History of shingles   . Hyperlipidemia    takes Fish Oil bid  . Hyperlipidemia   . Insomnia     has Ambien if needed  . Joint pain   . Peripheral neuropathy    takes Lyrica daily  . Pneumonia 58yrs ago   hx of  . Pneumonia    hx of  . PONV (postoperative nausea and vomiting)   . Thyroid nodule   . Urgency of urination    Leakage wears pad    Past Surgical History:  Procedure Laterality Date  . BACK SURGERY  1989  . BACK SURGERY    . CARPAL TUNNEL RELEASE Right 1985  . CARPAL TUNNEL RELEASE Right   . CERVICAL FUSION  2009  . CERVICAL FUSION    . CHOLECYSTECTOMY  2007  . CHOLECYSTECTOMY    . COLONOSCOPY    . ESOPHAGOGASTRODUODENOSCOPY    . HIP ARTHROPLASTY Bilateral   . REFRACTIVE SURGERY  2008  . TOTAL HIP ARTHROPLASTY Right 11/20/2013   Procedure: RIGHT TOTAL HIP ARTHROPLASTY;  Surgeon: Kerin Salen, MD;  Location: [facility];  Service: Orthopedics;  Laterality: Right;  . TOTAL HIP ARTHROPLASTY Left 08/30/2014   Procedure: TOTAL HIP ARTHROPLASTY;  Surgeon: Kerin Salen, MD;  Location: [facility];  Service: Orthopedics;  Laterality: Left;  . TOTAL KNEE ARTHROPLASTY Left 06/06/2015   Procedure: TOTAL KNEE ARTHROPLASTY;  Surgeon: Frederik Pear, MD;  Location: [facility];  Service: Orthopedics;  Laterality: Left;  . TUBAL LIGATION  1987  . TUBAL LIGATION    . TUMOR REMOVAL Right    Hand  . tumor removed from right hand  2011  . ulnar nerve release Left 1996  . ULNAR NERVE REPAIR Left     No current facility-administered medications for this encounter.    Current Outpatient Medications  Medication Sig Dispense Refill Last Dose  . acetaminophen (TYLENOL 8 HOUR ARTHRITIS PAIN) 650 MG CR tablet Take 650-1,300 mg by mouth 2 (two) times daily as needed for pain.     . Diclofenac-Misoprostol (ARTHROTEC) 75-0.2 MG TBEC Take 1 tablet by mouth 2 (two) times daily.    05/30/2015  . DULoxetine (CYMBALTA) 60 MG capsule Take 60 mg by mouth 2 (two) times daily.   06/06/2015 at Unknown time  . fenofibrate 160 MG tablet Take 160 mg by mouth daily.  0 06/05/2015 at Unknown time  . gabapentin  (NEURONTIN) 300 MG capsule Take 300-600 mg by mouth 2 (two) times daily. Take 1 capsule (300 mg) in the morning & 2 capsules (600 mg) at night.     Marland Kitchen glimepiride (AMARYL) 4 MG tablet Take 4 mg by mouth 2 (two) times daily.     . methocarbamol (ROBAXIN) 750 MG tablet Take 750 mg by mouth 4 (four) times daily as needed for muscle spasms.     . Multiple Vitamin (MULTIVITAMIN WITH MINERALS) TABS tablet Take 1 tablet by mouth daily.   06/05/2015 at Unknown time  . SitaGLIPtin-MetFORMIN HCl (JANUMET XR) [phone] MG TB24 Take 1 tablet by mouth daily with supper.      Allergies  Allergen Reactions  . Morphine And Related Nausea And Vomiting    "just don't tolerate it well"    Social History   Tobacco Use  . Smoking status: Former Research scientist (life sciences)  . Smokeless tobacco: Never Used  . Tobacco comment: quit smoking in 1997  Substance Use Topics  . Alcohol use: Yes    Comment: rare beer    Family History  Problem Relation Age of Onset  . Alzheimer's disease Mother   . Kidney failure Father      Review of Systems  Constitutional: Positive for diaphoresis.  HENT:       Difficulty swallowing  Eyes: Negative.   Respiratory: Negative.   Cardiovascular: Negative.   Gastrointestinal: Positive for constipation.  Genitourinary: Positive for urgency.       Poor bladder control  Musculoskeletal: Positive for joint pain and myalgias.  Neurological: Negative.   Psychiatric/Behavioral: Negative.     Objective:  Physical Exam  Constitutional: She is oriented to person, place, and time. She appears well-developed and well-nourished.  HENT:  Head: Normocephalic and atraumatic.  Eyes: Pupils are equal, round, and reactive to light.  Neck: Normal range of motion. Neck supple.  Cardiovascular: Intact distal pulses.  Respiratory: Effort normal.  Musculoskeletal: She exhibits tenderness.  she has good strength in the right knee.  She has mild tenderness anteriorly.  No instability.  Tenderness over the  medial joint line.  She has a range from 5 to 115.  Neurological: She is alert and oriented to person, place, and time.  Skin: Skin is warm and dry.  Psychiatric: She has a normal mood and affect. Her behavior is normal. Judgment and thought content normal.    Vital signs in last 24 hours:    Labs:   Estimated body mass index is 38.24 kg/m as calculated from the following:   Height as of 09/12/17: 5\' 4"  (1.626 m).   Weight as of 09/12/17: 101.1 kg (222 lb 12.8 oz).   Imaging Review Plain radiographs demonstrate an AP of the left knee shows bone-on-bone  arthritis with early lateral subluxation of tibia beneath the femur.  Assessment/Plan:  End stage arthritis, right knee   The patient history, physical examination, clinical judgment of the provider and imaging studies are consistent with end stage degenerative joint disease of the right knee(s) and total knee arthroplasty is deemed medically necessary. The treatment options including medical management, injection therapy arthroscopy and arthroplasty were discussed at length. The risks and benefits of total knee arthroplasty were presented and reviewed. The risks due to aseptic loosening, infection, stiffness, patella tracking problems, thromboembolic complications and other imponderables were discussed. The patient acknowledged the explanation, agreed to proceed with the plan and consent was signed. Patient is being admitted for inpatient treatment for surgery, pain control, PT, OT, prophylactic antibiotics, VTE prophylaxis, progressive ambulation and ADL's and discharge planning. The patient is planning to be discharged home with home health services.

## 2017-09-21 MED ORDER — BUPIVACAINE LIPOSOME 1.3 % IJ SUSP
20.0000 mL | Freq: Once | INTRAMUSCULAR | Status: DC
  Filled 2017-09-21: qty 20

## 2017-09-21 MED ORDER — TRANEXAMIC ACID 1000 MG/10ML IV SOLN
1000.0000 mg | INTRAVENOUS | Status: AC
  Filled 2017-09-21: qty 10

## 2017-09-21 MED ORDER — TRANEXAMIC ACID 1000 MG/10ML IV SOLN
2000.0000 mg | INTRAVENOUS | Status: AC
  Filled 2017-09-21: qty 20

## 2017-09-22 MED ORDER — TRANEXAMIC ACID 1000 MG/10ML IV SOLN
1000.0000 mg | INTRAVENOUS | Status: AC
  Administered 2017-09-23: 1000 mg via INTRAVENOUS
  Filled 2017-09-22: qty 1100

## 2017-09-22 MED ORDER — TRANEXAMIC ACID 1000 MG/10ML IV SOLN
2000.0000 mg | INTRAVENOUS | Status: DC
  Filled 2017-09-22: qty 20

## 2017-09-23 ENCOUNTER — Inpatient Hospital Stay (HOSPITAL_COMMUNITY)
Admission: RE | Admit: 2017-09-23 | Discharge: 2017-09-25 | DRG: 470 | Disposition: A | Discharge status: Home Health | Payer: PPO | Source: Ambulatory Visit | Attending: Orthopedic Surgery | Admitting: Orthopedic Surgery

## 2017-09-23 ENCOUNTER — Encounter (HOSPITAL_COMMUNITY): Payer: Self-pay | Admitting: Certified Registered"

## 2017-09-23 ENCOUNTER — Inpatient Hospital Stay (HOSPITAL_COMMUNITY): Payer: PPO | Admitting: Certified Registered"

## 2017-09-23 ENCOUNTER — Other Ambulatory Visit: Payer: Self-pay

## 2017-09-23 ENCOUNTER — Encounter (HOSPITAL_COMMUNITY)
Admission: RE | Disposition: A | Discharge status: Home Health | Payer: Self-pay | Source: Ambulatory Visit | Attending: Orthopedic Surgery

## 2017-09-23 DIAGNOSIS — M1711 Unilateral primary osteoarthritis, right knee: Secondary | ICD-10-CM | POA: Diagnosis not present

## 2017-09-23 DIAGNOSIS — D62 Acute posthemorrhagic anemia: Secondary | ICD-10-CM | POA: Diagnosis not present

## 2017-09-23 DIAGNOSIS — Z981 Arthrodesis status: Secondary | ICD-10-CM

## 2017-09-23 DIAGNOSIS — E1142 Type 2 diabetes mellitus with diabetic polyneuropathy: Secondary | ICD-10-CM | POA: Diagnosis not present

## 2017-09-23 DIAGNOSIS — Z7984 Long term (current) use of oral hypoglycemic drugs: Secondary | ICD-10-CM

## 2017-09-23 DIAGNOSIS — E785 Hyperlipidemia, unspecified: Secondary | ICD-10-CM | POA: Diagnosis not present

## 2017-09-23 DIAGNOSIS — M170 Bilateral primary osteoarthritis of knee: Secondary | ICD-10-CM | POA: Diagnosis not present

## 2017-09-23 DIAGNOSIS — E041 Nontoxic single thyroid nodule: Secondary | ICD-10-CM | POA: Diagnosis not present

## 2017-09-23 DIAGNOSIS — F329 Major depressive disorder, single episode, unspecified: Secondary | ICD-10-CM | POA: Diagnosis present

## 2017-09-23 DIAGNOSIS — Z8619 Personal history of other infectious and parasitic diseases: Secondary | ICD-10-CM | POA: Diagnosis not present

## 2017-09-23 DIAGNOSIS — Z96652 Presence of left artificial knee joint: Secondary | ICD-10-CM | POA: Diagnosis not present

## 2017-09-23 DIAGNOSIS — Z87891 Personal history of nicotine dependence: Secondary | ICD-10-CM | POA: Diagnosis not present

## 2017-09-23 DIAGNOSIS — M797 Fibromyalgia: Secondary | ICD-10-CM | POA: Diagnosis present

## 2017-09-23 DIAGNOSIS — Z96643 Presence of artificial hip joint, bilateral: Secondary | ICD-10-CM | POA: Diagnosis not present

## 2017-09-23 DIAGNOSIS — E119 Type 2 diabetes mellitus without complications: Secondary | ICD-10-CM | POA: Diagnosis not present

## 2017-09-23 DIAGNOSIS — Z9049 Acquired absence of other specified parts of digestive tract: Secondary | ICD-10-CM | POA: Diagnosis not present

## 2017-09-23 DIAGNOSIS — M1612 Unilateral primary osteoarthritis, left hip: Secondary | ICD-10-CM | POA: Diagnosis not present

## 2017-09-23 DIAGNOSIS — G8918 Other acute postprocedural pain: Secondary | ICD-10-CM | POA: Diagnosis not present

## 2017-09-23 HISTORY — PX: TOTAL KNEE ARTHROPLASTY: SHX125

## 2017-09-23 LAB — HEMOGLOBIN A1C
Hgb A1c MFr Bld: 6.1 % — ABNORMAL HIGH (ref 4.8–5.6)
MEAN PLASMA GLUCOSE: 128.37 mg/dL

## 2017-09-23 LAB — GLUCOSE, CAPILLARY
GLUCOSE-CAPILLARY: 106 mg/dL — AB (ref 65–99)
Glucose-Capillary: 142 mg/dL — ABNORMAL HIGH (ref 65–99)
Glucose-Capillary: 136 mg/dL — ABNORMAL HIGH (ref 65–99)
Glucose-Capillary: 189 mg/dL — ABNORMAL HIGH (ref 65–99)

## 2017-09-23 SURGERY — ARTHROPLASTY, KNEE, TOTAL
Anesthesia: Regional | Laterality: Right

## 2017-09-23 MED ORDER — INSULIN ASPART 100 UNIT/ML ~~LOC~~ SOLN
0.0000 [IU] | Freq: Three times a day (TID) | SUBCUTANEOUS | Status: DC
  Administered 2017-09-23: 2 [IU] via SUBCUTANEOUS
  Administered 2017-09-24 (×2): 5 [IU] via SUBCUTANEOUS

## 2017-09-23 MED ORDER — CHLORHEXIDINE GLUCONATE 4 % EX LIQD: 60.0000 mL | Freq: Once | CUTANEOUS | Status: DC

## 2017-09-23 MED ORDER — SITAGLIP PHOS-METFORMIN HCL ER 100-1000 MG PO TB24: 1.0000 | ORAL_TABLET | Freq: Every day | ORAL | Status: DC

## 2017-09-23 MED ORDER — ONDANSETRON HCL 4 MG/2ML IJ SOLN
INTRAMUSCULAR | Status: AC
  Filled 2017-09-23: qty 2

## 2017-09-23 MED ORDER — ACETAMINOPHEN 325 MG PO TABS
325.0000 mg | ORAL_TABLET | Freq: Four times a day (QID) | ORAL | Status: DC | PRN
  Administered 2017-09-24: 650 mg via ORAL
  Filled 2017-09-23: qty 2

## 2017-09-23 MED ORDER — CELECOXIB 200 MG PO CAPS
200.0000 mg | ORAL_CAPSULE | Freq: Two times a day (BID) | ORAL | Status: DC
  Administered 2017-09-23 – 2017-09-25 (×4): 200 mg via ORAL
  Filled 2017-09-23 (×4): qty 1

## 2017-09-23 MED ORDER — LINAGLIPTIN 5 MG PO TABS
5.0000 mg | ORAL_TABLET | Freq: Every day | ORAL | Status: DC
  Administered 2017-09-23 – 2017-09-25 (×3): 5 mg via ORAL
  Filled 2017-09-23 (×3): qty 1

## 2017-09-23 MED ORDER — CEFAZOLIN SODIUM-DEXTROSE 2-4 GM/100ML-% IV SOLN
2.0000 g | INTRAVENOUS | Status: AC
  Administered 2017-09-23: 2 g via INTRAVENOUS
  Filled 2017-09-23: qty 100

## 2017-09-23 MED ORDER — FLEET ENEMA 7-19 GM/118ML RE ENEM: 1.0000 | ENEMA | Freq: Once | RECTAL | Status: DC | PRN

## 2017-09-23 MED ORDER — METHOCARBAMOL 1000 MG/10ML IJ SOLN
500.0000 mg | Freq: Four times a day (QID) | INTRAVENOUS | Status: DC | PRN
  Filled 2017-09-23: qty 5

## 2017-09-23 MED ORDER — GABAPENTIN 300 MG PO CAPS
300.0000 mg | ORAL_CAPSULE | Freq: Every day | ORAL | Status: DC
  Administered 2017-09-24 – 2017-09-25 (×2): 300 mg via ORAL
  Filled 2017-09-23 (×2): qty 1

## 2017-09-23 MED ORDER — METOCLOPRAMIDE HCL 5 MG/ML IJ SOLN: 5.0000 mg | Freq: Three times a day (TID) | INTRAMUSCULAR | Status: DC | PRN

## 2017-09-23 MED ORDER — SENNOSIDES-DOCUSATE SODIUM 8.6-50 MG PO TABS
1.0000 | ORAL_TABLET | Freq: Every evening | ORAL | Status: DC | PRN
  Administered 2017-09-24: 1 via ORAL
  Filled 2017-09-23: qty 1

## 2017-09-23 MED ORDER — MIDAZOLAM HCL 2 MG/2ML IJ SOLN
INTRAMUSCULAR | Status: AC
  Filled 2017-09-23: qty 2

## 2017-09-23 MED ORDER — KCL IN DEXTROSE-NACL 20-5-0.45 MEQ/L-%-% IV SOLN
INTRAVENOUS | Status: DC
  Administered 2017-09-23: 100 mL/h via INTRAVENOUS
  Administered 2017-09-24: 02:00:00 via INTRAVENOUS
  Filled 2017-09-23: qty 1000

## 2017-09-23 MED ORDER — HYDROMORPHONE HCL 2 MG PO TABS: 2.0000 mg | ORAL_TABLET | ORAL | 0 refills | Status: DC | PRN

## 2017-09-23 MED ORDER — BISACODYL 5 MG PO TBEC
5.0000 mg | DELAYED_RELEASE_TABLET | Freq: Every day | ORAL | Status: DC | PRN
  Administered 2017-09-24: 5 mg via ORAL
  Filled 2017-09-23: qty 1

## 2017-09-23 MED ORDER — METFORMIN HCL ER 500 MG PO TB24
1000.0000 mg | ORAL_TABLET | Freq: Every day | ORAL | Status: DC
  Administered 2017-09-23 – 2017-09-24 (×2): 1000 mg via ORAL
  Filled 2017-09-23 (×2): qty 2

## 2017-09-23 MED ORDER — PHENYLEPHRINE HCL 10 MG/ML IJ SOLN
INTRAVENOUS | Status: DC | PRN
  Administered 2017-09-23: 30 ug/min via INTRAVENOUS

## 2017-09-23 MED ORDER — PROPOFOL 500 MG/50ML IV EMUL
INTRAVENOUS | Status: DC | PRN
  Administered 2017-09-23: 100 ug/kg/min via INTRAVENOUS

## 2017-09-23 MED ORDER — ONDANSETRON HCL 4 MG/2ML IJ SOLN
INTRAMUSCULAR | Status: DC | PRN
  Administered 2017-09-23: 4 mg via INTRAVENOUS

## 2017-09-23 MED ORDER — FENTANYL CITRATE (PF) 250 MCG/5ML IJ SOLN
INTRAMUSCULAR | Status: AC
  Filled 2017-09-23: qty 5

## 2017-09-23 MED ORDER — HYDROMORPHONE HCL 2 MG PO TABS
2.0000 mg | ORAL_TABLET | ORAL | Status: DC | PRN
Start: 2017-09-23 — End: 2017-09-25
  Administered 2017-09-23 – 2017-09-25 (×6): 2 mg via ORAL
  Filled 2017-09-23 (×6): qty 1

## 2017-09-23 MED ORDER — GLIMEPIRIDE 4 MG PO TABS
4.0000 mg | ORAL_TABLET | Freq: Two times a day (BID) | ORAL | Status: DC
  Administered 2017-09-23 – 2017-09-25 (×4): 4 mg via ORAL
  Filled 2017-09-23 (×4): qty 1

## 2017-09-23 MED ORDER — SODIUM CHLORIDE 0.9 % IJ SOLN
INTRAMUSCULAR | Status: DC | PRN
  Administered 2017-09-23: 10 mL

## 2017-09-23 MED ORDER — FENTANYL CITRATE (PF) 100 MCG/2ML IJ SOLN
INTRAMUSCULAR | Status: AC
  Filled 2017-09-23: qty 2

## 2017-09-23 MED ORDER — MENTHOL 3 MG MT LOZG: 1.0000 | LOZENGE | OROMUCOSAL | Status: DC | PRN

## 2017-09-23 MED ORDER — TRANEXAMIC ACID 1000 MG/10ML IV SOLN
1000.0000 mg | Freq: Once | INTRAVENOUS | Status: AC
  Administered 2017-09-23: 1000 mg via INTRAVENOUS
  Filled 2017-09-23: qty 10

## 2017-09-23 MED ORDER — DULOXETINE HCL 60 MG PO CPEP
60.0000 mg | ORAL_CAPSULE | Freq: Two times a day (BID) | ORAL | Status: DC
  Administered 2017-09-23 – 2017-09-25 (×4): 60 mg via ORAL
  Filled 2017-09-23: qty 1
  Filled 2017-09-23: qty 2
  Filled 2017-09-23 (×2): qty 1

## 2017-09-23 MED ORDER — DIPHENHYDRAMINE HCL 12.5 MG/5ML PO ELIX: 12.5000 mg | ORAL_SOLUTION | ORAL | Status: DC | PRN

## 2017-09-23 MED ORDER — FENTANYL CITRATE (PF) 100 MCG/2ML IJ SOLN
100.0000 ug | Freq: Once | INTRAMUSCULAR | Status: AC
  Administered 2017-09-23: 100 ug via INTRAVENOUS

## 2017-09-23 MED ORDER — LACTATED RINGERS IV SOLN
INTRAVENOUS | Status: DC
  Administered 2017-09-23 (×2): 50 mL/h via INTRAVENOUS

## 2017-09-23 MED ORDER — METOCLOPRAMIDE HCL 5 MG PO TABS: 5.0000 mg | ORAL_TABLET | Freq: Three times a day (TID) | ORAL | Status: DC | PRN

## 2017-09-23 MED ORDER — HYDROMORPHONE HCL 1 MG/ML IJ SOLN
0.5000 mg | INTRAMUSCULAR | Status: DC | PRN
  Administered 2017-09-23 – 2017-09-24 (×5): 1 mg via INTRAVENOUS
  Filled 2017-09-23 (×5): qty 1

## 2017-09-23 MED ORDER — GABAPENTIN 300 MG PO CAPS
600.0000 mg | ORAL_CAPSULE | Freq: Every day | ORAL | Status: DC
Start: 2017-09-23 — End: 2017-09-25
  Administered 2017-09-23 – 2017-09-24 (×2): 600 mg via ORAL
  Filled 2017-09-23 (×2): qty 2

## 2017-09-23 MED ORDER — BUPIVACAINE-EPINEPHRINE (PF) 0.25% -1:200000 IJ SOLN
INTRAMUSCULAR | Status: DC | PRN
  Administered 2017-09-23: 50 mL via PERINEURAL

## 2017-09-23 MED ORDER — MIDAZOLAM HCL 2 MG/2ML IJ SOLN
2.0000 mg | Freq: Once | INTRAMUSCULAR | Status: AC
  Administered 2017-09-23: 2 mg via INTRAVENOUS

## 2017-09-23 MED ORDER — ONDANSETRON HCL 4 MG/2ML IJ SOLN
4.0000 mg | Freq: Four times a day (QID) | INTRAMUSCULAR | Status: DC | PRN
  Administered 2017-09-23: 4 mg via INTRAVENOUS
  Filled 2017-09-23: qty 2

## 2017-09-23 MED ORDER — BUPIVACAINE-EPINEPHRINE 0.25% -1:200000 IJ SOLN
INTRAMUSCULAR | Status: AC
  Filled 2017-09-23: qty 1

## 2017-09-23 MED ORDER — PHENOL 1.4 % MT LIQD: 1.0000 | OROMUCOSAL | Status: DC | PRN

## 2017-09-23 MED ORDER — SODIUM CHLORIDE 0.9 % IR SOLN
Status: DC | PRN
  Administered 2017-09-23: 3000 mL

## 2017-09-23 MED ORDER — SODIUM CHLORIDE 0.9 % IV SOLN
INTRAVENOUS | Status: AC | PRN
  Administered 2017-09-23: 2000 mg via TOPICAL

## 2017-09-23 MED ORDER — LACTATED RINGERS IV SOLN: INTRAVENOUS | Status: DC

## 2017-09-23 MED ORDER — ASPIRIN EC 325 MG PO TBEC: 325.0000 mg | DELAYED_RELEASE_TABLET | Freq: Two times a day (BID) | ORAL | 0 refills | Status: DC

## 2017-09-23 MED ORDER — FENOFIBRATE 160 MG PO TABS
160.0000 mg | ORAL_TABLET | Freq: Every day | ORAL | Status: DC
  Administered 2017-09-24 – 2017-09-25 (×2): 160 mg via ORAL
  Filled 2017-09-23 (×2): qty 1

## 2017-09-23 MED ORDER — ONDANSETRON HCL 4 MG PO TABS: 4.0000 mg | ORAL_TABLET | Freq: Four times a day (QID) | ORAL | Status: DC | PRN

## 2017-09-23 MED ORDER — METHOCARBAMOL 500 MG PO TABS
500.0000 mg | ORAL_TABLET | Freq: Four times a day (QID) | ORAL | Status: DC | PRN
  Administered 2017-09-23 – 2017-09-25 (×4): 500 mg via ORAL
  Filled 2017-09-23 (×4): qty 1

## 2017-09-23 MED ORDER — BUPIVACAINE LIPOSOME 1.3 % IJ SUSP
INTRAMUSCULAR | Status: DC | PRN
  Administered 2017-09-23: 20 mL

## 2017-09-23 MED ORDER — ALUM & MAG HYDROXIDE-SIMETH 200-200-20 MG/5ML PO SUSP: 30.0000 mL | ORAL | Status: DC | PRN

## 2017-09-23 MED ORDER — ASPIRIN EC 325 MG PO TBEC
325.0000 mg | DELAYED_RELEASE_TABLET | Freq: Every day | ORAL | Status: DC
  Administered 2017-09-24 – 2017-09-25 (×2): 325 mg via ORAL
  Filled 2017-09-23 (×2): qty 1

## 2017-09-23 MED ORDER — KCL IN DEXTROSE-NACL 20-5-0.45 MEQ/L-%-% IV SOLN
INTRAVENOUS | Status: AC
  Filled 2017-09-23: qty 1000

## 2017-09-23 MED ORDER — 0.9 % SODIUM CHLORIDE (POUR BTL) OPTIME
TOPICAL | Status: DC | PRN
  Administered 2017-09-23: 1000 mL

## 2017-09-23 MED ORDER — DOCUSATE SODIUM 100 MG PO CAPS
100.0000 mg | ORAL_CAPSULE | Freq: Two times a day (BID) | ORAL | Status: DC
  Administered 2017-09-24 – 2017-09-25 (×3): 100 mg via ORAL
  Filled 2017-09-23 (×4): qty 1

## 2017-09-23 MED ORDER — METHOCARBAMOL 750 MG PO TABS: 750.0000 mg | ORAL_TABLET | Freq: Four times a day (QID) | ORAL | 0 refills | Status: DC | PRN

## 2017-09-23 MED ORDER — TRAMADOL HCL 50 MG PO TABS
50.0000 mg | ORAL_TABLET | Freq: Four times a day (QID) | ORAL | Status: DC
  Administered 2017-09-23 – 2017-09-25 (×5): 50 mg via ORAL
  Filled 2017-09-23 (×5): qty 1

## 2017-09-23 SURGICAL SUPPLY — 56 items
BAG DECANTER FOR FLEXI CONT (MISCELLANEOUS) ×2 IMPLANT
BANDAGE ELASTIC 6 VELCRO ST LF (GAUZE/BANDAGES/DRESSINGS) ×2 IMPLANT
BANDAGE ESMARK 6X9 LF (GAUZE/BANDAGES/DRESSINGS) ×1 IMPLANT
BLADE SAG 18X100X1.27 (BLADE) ×3 IMPLANT
BLADE SAGITTAL 13X1.27X60 (BLADE) IMPLANT
BLADE SAGITTAL 13X1.27X60MM (BLADE)
BLADE SAW SGTL 13X75X1.27 (BLADE) ×2 IMPLANT
BNDG CMPR 9X6 STRL LF SNTH (GAUZE/BANDAGES/DRESSINGS) ×1
BNDG CMPR MED 10X6 ELC LF (GAUZE/BANDAGES/DRESSINGS) ×1
BNDG ELASTIC 6X10 VLCR STRL LF (GAUZE/BANDAGES/DRESSINGS) ×3 IMPLANT
BNDG ESMARK 6X9 LF (GAUZE/BANDAGES/DRESSINGS) ×3
BOWL SMART MIX CTS (DISPOSABLE) ×3 IMPLANT
CAPT KNEE TOTAL 3 ATTUNE ×3 IMPLANT
CEMENT HV SMART SET (Cement) ×6 IMPLANT
COVER SURGICAL LIGHT HANDLE (MISCELLANEOUS) ×3 IMPLANT
CUFF TOURNIQUET SINGLE 34IN LL (TOURNIQUET CUFF) ×3 IMPLANT
CUFF TOURNIQUET SINGLE 44IN (TOURNIQUET CUFF) IMPLANT
DRAPE EXTREMITY T 121X128X90 (DRAPE) ×3 IMPLANT
DRAPE U-SHAPE 47X51 STRL (DRAPES) ×3 IMPLANT
DRSG AQUACEL AG ADV 3.5X10 (GAUZE/BANDAGES/DRESSINGS) ×3 IMPLANT
DURAPREP 26ML APPLICATOR (WOUND CARE) ×3 IMPLANT
ELECT REM PT RETURN 9FT ADLT (ELECTROSURGICAL) ×3
ELECTRODE REM PT RTRN 9FT ADLT (ELECTROSURGICAL) ×1 IMPLANT
FLUID NSS /IRRIG 3000 ML XXX (IV SOLUTION) ×2 IMPLANT
GLOVE BIO SURGEON STRL SZ7.5 (GLOVE) ×5 IMPLANT
GLOVE BIO SURGEON STRL SZ8.5 (GLOVE) ×3 IMPLANT
GLOVE BIOGEL PI IND STRL 8 (GLOVE) ×1 IMPLANT
GLOVE BIOGEL PI IND STRL 9 (GLOVE) ×1 IMPLANT
GLOVE BIOGEL PI INDICATOR 8 (GLOVE) ×2
GLOVE BIOGEL PI INDICATOR 9 (GLOVE) ×2
GOWN STRL REUS W/ TWL LRG LVL3 (GOWN DISPOSABLE) ×1 IMPLANT
GOWN STRL REUS W/ TWL XL LVL3 (GOWN DISPOSABLE) ×2 IMPLANT
GOWN STRL REUS W/TWL LRG LVL3 (GOWN DISPOSABLE) ×6
GOWN STRL REUS W/TWL XL LVL3 (GOWN DISPOSABLE) ×6
HANDPIECE INTERPULSE COAX TIP (DISPOSABLE) ×3
HOOD PEEL AWAY FACE SHEILD DIS (HOOD) ×6 IMPLANT
KIT BASIN OR (CUSTOM PROCEDURE TRAY) ×3 IMPLANT
KIT ROOM TURNOVER OR (KITS) ×3 IMPLANT
MANIFOLD NEPTUNE II (INSTRUMENTS) ×3 IMPLANT
NEEDLE 22X1 1/2 (OR ONLY) (NEEDLE) ×6 IMPLANT
NS IRRIG 1000ML POUR BTL (IV SOLUTION) ×3 IMPLANT
PACK TOTAL JOINT (CUSTOM PROCEDURE TRAY) ×3 IMPLANT
PAD ARMBOARD 7.5X6 YLW CONV (MISCELLANEOUS) ×6 IMPLANT
SET HNDPC FAN SPRY TIP SCT (DISPOSABLE) ×1 IMPLANT
SUT VIC AB 0 CT1 27 (SUTURE) ×3
SUT VIC AB 0 CT1 27XBRD ANBCTR (SUTURE) ×1 IMPLANT
SUT VIC AB 1 CTX 36 (SUTURE) ×3
SUT VIC AB 1 CTX36XBRD ANBCTR (SUTURE) ×1 IMPLANT
SUT VIC AB 2-0 CT1 27 (SUTURE) ×3
SUT VIC AB 2-0 CT1 TAPERPNT 27 (SUTURE) ×1 IMPLANT
SUT VIC AB 3-0 CT1 27 (SUTURE) ×3
SUT VIC AB 3-0 CT1 TAPERPNT 27 (SUTURE) ×1 IMPLANT
SYR CONTROL 10ML LL (SYRINGE) ×6 IMPLANT
TOWEL OR 17X24 6PK STRL BLUE (TOWEL DISPOSABLE) ×3 IMPLANT
TOWEL OR 17X26 10 PK STRL BLUE (TOWEL DISPOSABLE) ×3 IMPLANT
TRAY CATH 16FR W/PLASTIC CATH (SET/KITS/TRAYS/PACK) ×2 IMPLANT

## 2017-09-23 NOTE — Discharge Instructions (Signed)

## 2017-09-23 NOTE — Anesthesia Procedure Notes (Signed)
Spinal  Patient location during procedure: OR Start time: 09/23/2017 12:07 PM End time: 09/23/2017 12:10 PM Staffing Anesthesiologist: Lillia Abed, MD Performed: anesthesiologist  Preanesthetic Checklist Completed: patient identified, surgical consent, pre-op evaluation, timeout performed, IV checked, risks and benefits discussed and monitors and equipment checked Spinal Block Patient position: sitting Prep: DuraPrep Patient monitoring: blood pressure, continuous pulse ox, cardiac monitor and heart rate Approach: right paramedian Location: L3-4 Injection technique: single-shot Needle Needle type: Pencan  Needle gauge: 24 G Needle length: 9 cm Needle insertion depth: 6 cm

## 2017-09-23 NOTE — Anesthesia Procedure Notes (Signed)
Anesthesia Regional Block: Adductor canal block   Pre-Anesthetic Checklist: ,, timeout performed, Correct Patient, Correct Site, Correct Laterality, Correct Procedure, Correct Position, site marked, Risks and benefits discussed,  Surgical consent,  Pre-op evaluation,  At surgeon's request and post-op pain management  Laterality: Right  Prep: chloraprep       Needles:  Injection technique: Single-shot  Needle Type: Echogenic Stimulator Needle     Needle Length: 9cm  Needle Gauge: 21     Additional Needles:   Narrative:  Start time: 09/23/2017 11:20 AM End time: 09/23/2017 11:30 AM Injection made incrementally with aspirations every 5 mL.  Performed by: Personally  Anesthesiologist: Lillia Abed, MD  Additional Notes: Monitors applied. Patient sedated. Sterile prep and drape,hand hygiene and sterile gloves were used. Relevant anatomy identified.Needle position confirmed.Local anesthetic injected incrementally after negative aspiration. Local anesthetic spread visualized around nerve(s). Vascular puncture avoided. No complications. Image printed for medical record.The patient tolerated the procedure well.    Lillia Abed MD

## 2017-09-23 NOTE — Transfer of Care (Signed)
Immediate Anesthesia Transfer of Care Note  Patient: Madison Stein  Procedure(s) Performed: TOTAL KNEE ARTHROPLASTY (Right )  Patient Location: PACU  Anesthesia Type:Spinal and MAC combined with regional for post-op pain  Level of Consciousness: awake, alert , oriented and patient cooperative  Airway & Oxygen Therapy: Patient Spontanous Breathing  Post-op Assessment: Report given to RN and Post -op Vital signs reviewed and stable  Post vital signs: Reviewed and stable  Last Vitals:  Vitals:   09/23/17 1135 09/23/17 1142  BP: 130/74 133/64  Pulse: 90 86  Resp:    Temp:    SpO2: 98% 100%    Last Pain:  Vitals:   09/23/17 1048  TempSrc: Oral         Complications: No apparent anesthesia complications

## 2017-09-23 NOTE — Progress Notes (Signed)
Orthopedic Tech Progress Note Patient Details:  Madison Stein Oct 27, 1950 [id number]  Ortho Devices Ortho Device/Splint Location: Foot roll Ortho Device/Splint Interventions: Application   Post Interventions Patient Tolerated: Well Instructions Provided: Care of device   [name] 09/23/2017, 2:06 PM

## 2017-09-23 NOTE — Anesthesia Preprocedure Evaluation (Addendum)
Anesthesia Evaluation  Patient identified by MRN, date of birth, ID band Patient awake    Reviewed: Allergy & Precautions, NPO status , Patient's Chart, lab work & pertinent test results  History of Anesthesia Complications (+) PONV  Airway Mallampati: II  TM Distance: >3 FB Neck ROM: Full    Dental  (+) Teeth Intact, Dental Advisory Given   Pulmonary former smoker,    Pulmonary exam normal        Cardiovascular Normal cardiovascular exam Rhythm:Regular Rate:Normal     Neuro/Psych PSYCHIATRIC DISORDERS Depression    GI/Hepatic   Endo/Other  diabetes, Well Controlled, Type 2, Oral Hypoglycemic Agents  Renal/GU      Musculoskeletal  (+) Arthritis , Fibromyalgia -  Abdominal   Peds  Hematology   Anesthesia Other Findings   Reproductive/Obstetrics                            Anesthesia Physical Anesthesia Plan  ASA: II  Anesthesia Plan: Spinal and Regional   Post-op Pain Management:  Regional for Post-op pain   Induction: Intravenous  PONV Risk Score and Plan: 3 and Ondansetron and Treatment may vary due to age or medical condition  Airway Management Planned: Simple Face Mask  Additional Equipment:   Intra-op Plan:   Post-operative Plan:   Informed Consent: I have reviewed the patients History and Physical, chart, labs and discussed the procedure including the risks, benefits and alternatives for the proposed anesthesia with the patient or authorized representative who has indicated his/her understanding and acceptance.     Plan Discussed with: Surgeon and CRNA  Anesthesia Plan Comments:        Anesthesia Quick Evaluation

## 2017-09-23 NOTE — Evaluation (Signed)
Physical Therapy Evaluation Patient Details Name: Madison Stein MRN: [id number] DOB: [birth year] Today's Date: 09/23/2017   History of Present Illness  Patient admitted for R TKA, PMH positive for L TKA, B THA, fibromyalgia, DM, depression.   Clinical Impression  Patient presents with decreased independence with mobility due to weakness, decreased ROM and strength R LE and pain and she will benefit from skilled PT in the acute setting to allow d/c home with sister to assist and follow up PT.     Follow Up Recommendations Follow surgeon's recommendation for DC plan and follow-up therapies    Equipment Recommendations  None recommended by PT    Recommendations for Other Services       Precautions / Restrictions Precautions Precautions: Fall;Knee Precaution Comments: educated about no pillow under the knee at rest Restrictions Weight Bearing Restrictions: Yes RLE Weight Bearing: Weight bearing as tolerated      Mobility  Bed Mobility Overal bed mobility: Needs Assistance Bed Mobility: Supine to Sit     Supine to sit: Min guard;HOB elevated     General bed mobility comments: increased time, assist for guiding leg off bed  Transfers Overall transfer level: Needs assistance Equipment used: Rolling walker (2 wheeled) Transfers: Sit to/from [facility] Sit to Stand: Min assist Stand pivot transfers: Min assist       General transfer comment: cues for hand placement, assist for lift off from EOB and cues for stepping to chair with RW, c/o light headed and nauseated, BP after in chair 107/53  Ambulation/Gait                Stairs            Wheelchair Mobility    Modified Rankin (Stroke Patients Only)       Balance Overall balance assessment: Needs assistance   Sitting balance-Leahy Scale: Good     Standing balance support: Bilateral upper extremity supported Standing balance-Leahy Scale: Poor Standing balance comment: UE support  for balance due to pain and weakness post surgery                             Pertinent Vitals/Pain Pain Assessment: 0-10 Pain Score: 9  Pain Location: R knee Pain Descriptors / Indicators: Operative site guarding Pain Intervention(s): Limited activity within patient's tolerance;Monitored during session    Home Living Family/patient expects to be discharged to:: Private residence Living Arrangements: Alone Available Help at Discharge: Family;Available 24 hours/day Type of Home: House Home Access: Stairs to enter   [employer] of Steps: 1 Home Layout: One level Home Equipment: Walker - 2 wheels;Bedside commode;Shower seat      Prior Function Level of Independence: Independent with assistive device(s)         Comments: cane for ambulation PTA; drives does all ADL/IADL's; retired     Journalist, newspaper        Extremity/Trunk Assessment   Upper Extremity Assessment Upper Extremity Assessment: Overall WFL for tasks assessed    Lower Extremity Assessment Lower Extremity Assessment: RLE deficits/detail RLE Deficits / Details: AROM ankle WFL, knee flexion at least 50, extension to neutral, lifts antigravity well       Communication   Communication: No difficulties  Cognition Arousal/Alertness: Awake/alert Behavior During Therapy: WFL for tasks assessed/performed Overall Cognitive Status: Within Functional Limits for tasks assessed  General Comments General comments (skin integrity, edema, etc.): RN in room to deliver meds and informed of BP    Exercises Total Joint Exercises Ankle Circles/Pumps: AROM;10 reps;Both;Supine Quad Sets: AROM;5 reps;Both;Supine   Assessment/Plan    PT Assessment Patient needs continued PT services  PT Problem List Decreased strength;Decreased mobility;Decreased range of motion;Decreased activity tolerance;Decreased balance;Decreased knowledge of use of  DME;Pain;Decreased knowledge of precautions       PT Treatment Interventions DME instruction;Therapeutic activities;Gait training;Therapeutic exercise;Patient/family education;Stair training;Balance training;Functional mobility training    PT Goals (Current goals can be found in the Care Plan section)  Acute Rehab PT Goals Patient Stated Goal: to go home PT Goal Formulation: With patient Time For Goal Achievement: 09/27/17 Potential to Achieve Goals: Good    Frequency 7X/week   Barriers to discharge        Co-evaluation               AM-PAC PT "6 Clicks" Daily Activity  Outcome Measure Difficulty turning over in bed (including adjusting bedclothes, sheets and blankets)?: A Little Difficulty moving from lying on back to sitting on the side of the bed? : A Lot Difficulty sitting down on and standing up from a chair with arms (e.g., wheelchair, bedside commode, etc,.)?: Unable Help needed moving to and from a bed to chair (including a wheelchair)?: A Little Help needed walking in hospital room?: A Little Help needed climbing 3-5 steps with a railing? : A Lot 6 Click Score: 14    End of Session Equipment Utilized During Treatment: Gait belt Activity Tolerance: Other (comment);Patient limited by pain(limted by pain and low BP symptomatic in standing) Patient left: with call bell/phone within reach;in chair;with family/visitor present;with nursing/sitter in room   PT Visit Diagnosis: Difficulty in walking, not elsewhere classified (R26.2);Pain Pain - Right/Left: Right Pain - part of body: Knee    Time: 5462-7035 PT Time Calculation (min) (ACUTE ONLY): 28 min   Charges:   PT Evaluation $PT Eval Moderate Complexity: 1 Mod PT Treatments $Therapeutic Activity: 8-22 mins   PT G CodesMagda Kiel, [place] [phone] 09/23/2017   Reginia Naas 09/23/2017, 5:47 PM

## 2017-09-23 NOTE — Anesthesia Postprocedure Evaluation (Signed)
Anesthesia Post Note  Patient: Madison Stein  Procedure(s) Performed: TOTAL KNEE ARTHROPLASTY (Right )     Patient location during evaluation: PACU Anesthesia Type: Regional Level of consciousness: oriented and awake and alert Pain management: pain level controlled Vital Signs Assessment: post-procedure vital signs reviewed and stable Respiratory status: spontaneous breathing, respiratory function stable and patient connected to nasal cannula oxygen Cardiovascular status: blood pressure returned to baseline and stable Postop Assessment: no headache, no backache and no apparent nausea or vomiting Anesthetic complications: no    Last Vitals:  Vitals:   09/23/17 1530 09/23/17 1545  BP: (!) 103/58 113/64  Pulse: 76 75  Resp: 14 16  Temp: (!) 36.4 C   SpO2: 100% 100%    Last Pain:  Vitals:   09/23/17 1430  TempSrc:   PainSc: 0-No pain                 Nizhoni Parlow DAVID

## 2017-09-23 NOTE — Op Note (Signed)
PATIENT ID:      Madison Stein  MRN:     [id number] DOB/AGE:    65-Sep-1952 / 65 y.o.       OPERATIVE REPORT    DATE OF PROCEDURE:  09/23/2017       PREOPERATIVE DIAGNOSIS:   RIGHT KNEE OSTEOARTHRITIS      Estimated body mass index is 36.05 kg/m as calculated from the following:   Height as of this encounter: 5\' 4"  (1.626 m).   Weight as of this encounter: 210 lb (95.3 kg).                                                        POSTOPERATIVE DIAGNOSIS:   RIGHT KNEE OSTEOARTHRITIS                                                                      PROCEDURE:  Procedure(s): TOTAL KNEE ARTHROPLASTY Using DepuyAttune RP implants #5R Femur, #5Tibia, 5 mm Attune RP bearing, 28 Patella     SURGEON: Kerin Salen    ASSISTANT:   Kerry Hough. [employer]   (Present and scrubbed throughout the case, critical for assistance with exposure, retraction, instrumentation, and closure.)         ANESTHESIA: Spinal, 20cc Exparel, 50cc 0.25% Marcaine  EBL: 300cc  FLUID REPLACEMENT: 1600 crystalloid  TOURNIQUET TIME: 22min  Drains: None  Tranexamic Acid: 1gm IV, 2gm topical  COMPLICATIONS:  None         INDICATIONS FOR PROCEDURE: The patient has  RIGHT KNEE OSTEOARTHRITIS, Var deformities, XR shows bone on bone arthritis, lateral subluxation of tibia. Patient has failed all conservative measures including anti-inflammatory medicines, narcotics, attempts at  exercise and weight loss, cortisone injections and viscosupplementation.  Risks and benefits of surgery have been discussed, questions answered.   DESCRIPTION OF PROCEDURE: The patient identified by armband, received  IV antibiotics, in the holding area at [facility]. Patient taken to the operating room, appropriate anesthetic  monitors were attached, and Spinal anesthesia was  induced. Tourniquet  applied high to the operative thigh. Lateral post and foot positioner  applied to the table, the lower extremity was then prepped and draped  in  usual sterile fashion from the toes to the tourniquet. Time-out procedure was performed. We began the operation, with the knee flexed 120 degrees, by making the anterior midline incision starting at handbreadth above the patella going over the patella 1 cm medial to and 4 cm distal to the tibial tubercle. Small bleeders in the skin and the  subcutaneous tissue identified and cauterized. Transverse retinaculum was incised and reflected medially and a medial parapatellar arthrotomy was accomplished. the patella was everted and theprepatellar fat pad resected. The superficial medial collateral  ligament was then elevated from anterior to posterior along the proximal  flare of the tibia and anterior half of the menisci resected. The knee was hyperflexed exposing bone on bone arthritis. Peripheral and notch osteophytes as well as the cruciate ligaments were then resected. We continued to  work our way around posteriorly along the proximal  tibia, and externally  rotated the tibia subluxing it out from underneath the femur. A McHale  retractor was placed through the notch and a lateral Hohmann retractor  placed, and we then drilled through the proximal tibia in line with the  axis of the tibia followed by an intramedullary guide rod and 2-degree  posterior slope cutting guide. The tibial cutting guide, 3 degree posterior sloped, was pinned into place allowing resection of 3 mm of bone medially and 10 mm of bone laterally. Satisfied with the tibial resection, we then  entered the distal femur 2 mm anterior to the PCL origin with the  intramedullary guide rod and applied the distal femoral cutting guide  set at 9 mm, with 5 degrees of valgus. This was pinned along the  epicondylar axis. At this point, the distal femoral cut was accomplished without difficulty. We then sized for a #5R femoral component and pinned the guide in 3 degrees of external rotation. The chamfer cutting guide was pinned into place. The  anterior, posterior, and chamfer cuts were accomplished without difficulty followed by  the Attune RP box cutting guide and the box cut. We also removed posterior osteophytes from the posterior femoral condyles. At this  time, the knee was brought into full extension. We checked our  extension and flexion gaps and found them symmetric for a 5 mm bearing. Distracting in extension with a lamina spreader, the posterior horns of the menisci were removed, and Exparel, diluted to 60 cc, with 20cc NS, and 20cc 0.5% Marcaine,was injected into the capsule and synovium of the knee. The posterior patella cut was accomplished with the 9.5 mm Attune cutting guide, sized for a 42mm dome, and the fixation pegs drilled.The knee  was then once again hyperflexed exposing the proximal tibia. We sized for a # 5 tibial base plate, applied the smokestack and the conical reamer followed by the the Delta fin keel punch. We then hammered into place the Attune RP trial femoral component, drilled the lugs, inserted a  5 mm trial bearing, trial patellar button, and took the knee through range of motion from 0-130 degrees. No thumb pressure was required for patellar Tracking. At this point, the limb was wrapped with an Esmarch bandage and the tourniquet inflated to 350 mmHg. All trial components were removed, mating surfaces irrigated with pulse lavage, and dried with suction and sponges. 10 cc of the Exparel solution was applied to the cancellus bone of the patella distal femur and proximal tibia.  After waiting 1 minute, the bony surfaces were again, dried with sponges. A double batch of DePuy HV cement with 1500 mg of Zinacef was mixed and applied to all bony metallic mating surfaces except for the posterior condyles of the femur itself. In order, we hammered into place the tibial tray and removed excess cement, the femoral component and removed excess cement. The final Attune RP bearing  was inserted, and the knee brought to full  extension with compression.  The patellar button was clamped into place, and excess cement  removed. While the cement cured the wound was irrigated out with normal saline solution pulse lavage. Ligament stability and patellar tracking were checked and found to be excellent. The parapatellar arthrotomy was closed with  running #1 Vicryl suture. The subcutaneous tissue with 0 and 2-0 undyed  Vicryl suture, and the skin with running 3-0 SQ vicryl. A dressing of Xeroform,  4 x 4, dressing sponges, Webril, and Ace wrap applied. The patient  awakened, and  taken to recovery room without difficulty.   Kerin Salen 09/23/2017, 1:38 PM

## 2017-09-23 NOTE — Interval H&P Note (Signed)
History and Physical Interval Note:  09/23/2017 11:19 AM  Madison Stein  has presented today for surgery, with the diagnosis of RIGHT KNEE OSTEOARTHRITIS  The various methods of treatment have been discussed with the patient and family. After consideration of risks, benefits and other options for treatment, the patient has consented to  Procedure(s): TOTAL KNEE ARTHROPLASTY (Right) as a surgical intervention .  The patient's history has been reviewed, patient examined, no change in status, stable for surgery.  I have reviewed the patient's chart and labs.  Questions were answered to the patient's satisfaction.     Kerin Salen

## 2017-09-24 ENCOUNTER — Encounter (HOSPITAL_COMMUNITY): Payer: Self-pay | Admitting: Orthopedic Surgery

## 2017-09-24 LAB — BASIC METABOLIC PANEL
ANION GAP: 7 (ref 5–15)
BUN: 10 mg/dL (ref 6–20)
CHLORIDE: 102 mmol/L (ref 101–111)
CO2: 26 mmol/L (ref 22–32)
Calcium: 8.5 mg/dL — ABNORMAL LOW (ref 8.9–10.3)
Creatinine, Ser: 0.89 mg/dL (ref 0.44–1.00)
Glucose, Bld: 153 mg/dL — ABNORMAL HIGH (ref 65–99)
POTASSIUM: 4.6 mmol/L (ref 3.5–5.1)
SODIUM: 135 mmol/L (ref 135–145)

## 2017-09-24 LAB — CBC
HCT: 34.6 % — ABNORMAL LOW (ref 36.0–46.0)
HEMOGLOBIN: 11 g/dL — AB (ref 12.0–15.0)
MCH: 27.6 pg (ref 26.0–34.0)
MCHC: 31.8 g/dL (ref 30.0–36.0)
MCV: 86.7 fL (ref 78.0–100.0)
PLATELETS: 268 10*3/uL (ref 150–400)
RBC: 3.99 MIL/uL (ref 3.87–5.11)
RDW: 14.5 % (ref 11.5–15.5)
WBC: 13.3 10*3/uL — ABNORMAL HIGH (ref 4.0–10.5)

## 2017-09-24 LAB — GLUCOSE, CAPILLARY
GLUCOSE-CAPILLARY: 119 mg/dL — AB (ref 65–99)
GLUCOSE-CAPILLARY: 184 mg/dL — AB (ref 65–99)
Glucose-Capillary: 211 mg/dL — ABNORMAL HIGH (ref 65–99)
Glucose-Capillary: 222 mg/dL — ABNORMAL HIGH (ref 65–99)

## 2017-09-24 NOTE — Progress Notes (Signed)
Physical Therapy Treatment Patient Details Name: Madison Stein MRN: [id number] DOB: [birth year] Today's Date: 09/24/2017    History of Present Illness Patient admitted for R TKA, PMH positive for L TKA, B THA, fibromyalgia, DM, depression.     PT Comments    Patient is progressing well toward mobility goals and tolerated gait and stair training without increase in pain. From a mobility stand point pt is on track for d/c home with assist from family.    Follow Up Recommendations  Follow surgeon's recommendation for DC plan and follow-up therapies     Equipment Recommendations  None recommended by PT    Recommendations for Other Services       Precautions / Restrictions Precautions Precautions: Fall;Knee Precaution Comments: knee precautions reviewed with pt Restrictions Weight Bearing Restrictions: Yes RLE Weight Bearing: Weight bearing as tolerated    Mobility  Bed Mobility Overal bed mobility: Modified Independent Bed Mobility: Supine to Sit           General bed mobility comments: cues for technique; increased time and effort; pt used L LE to assist R LE to EOB  Transfers Overall transfer level: Needs assistance Equipment used: Rolling walker (2 wheeled) Transfers: Sit to/from [facility] Sit to Stand: Min guard         General transfer comment: min guard for safety; cues for safe hand placement  Ambulation/Gait Ambulation/Gait assistance: Supervision Ambulation Distance (Feet): 150 Feet Assistive device: Rolling walker (2 wheeled) Gait Pattern/deviations: Step-through pattern;Decreased weight shift to right;Decreased stride length     General Gait Details: cues for sequencing, R knee flexion during swing phase, and step length symmetry; pt with improved step through pattern adn less reliance on UE support with increased distance   Stairs Stairs: Yes   Stair Management: No rails;Step to pattern;Backwards;With walker Number of Stairs:  2 General stair comments: cues for sequencingn and technique; assist to stabilize RW  Wheelchair Mobility    Modified Rankin (Stroke Patients Only)       Balance Overall balance assessment: Needs assistance   Sitting balance-Leahy Scale: Good     Standing balance support: Bilateral upper extremity supported Standing balance-Leahy Scale: Poor                              Cognition Arousal/Alertness: Awake/alert Behavior During Therapy: WFL for tasks assessed/performed Overall Cognitive Status: Within Functional Limits for tasks assessed                                        Exercises      General Comments        Pertinent Vitals/Pain Pain Assessment: Faces Faces Pain Scale: Hurts little more Pain Location: R knee Pain Descriptors / Indicators: Guarding;Sore Pain Intervention(s): Limited activity within patient's tolerance;Monitored during session;Premedicated before session;Repositioned    Home Living                      Prior Function            PT Goals (current goals can now be found in the care plan section) Acute Rehab PT Goals Patient Stated Goal: to go home PT Goal Formulation: With patient Time For Goal Achievement: 09/27/17 Potential to Achieve Goals: Good Progress towards PT goals: Progressing toward goals    Frequency    7X/week  PT Plan Current plan remains appropriate    Co-evaluation              AM-PAC PT "6 Clicks" Daily Activity  Outcome Measure  Difficulty turning over in bed (including adjusting bedclothes, sheets and blankets)?: A Little Difficulty moving from lying on back to sitting on the side of the bed? : A Little Difficulty sitting down on and standing up from a chair with arms (e.g., wheelchair, bedside commode, etc,.)?: Unable Help needed moving to and from a bed to chair (including a wheelchair)?: A Little Help needed walking in hospital room?: A Little Help needed  climbing 3-5 steps with a railing? : A Little 6 Click Score: 16    End of Session Equipment Utilized During Treatment: Gait belt Activity Tolerance: Patient tolerated treatment well Patient left: with call bell/phone within reach;in chair;with nursing/sitter in room Nurse Communication: Mobility status PT Visit Diagnosis: Difficulty in walking, not elsewhere classified (R26.2);Pain Pain - Right/Left: Right Pain - part of body: Knee     Time: 0950-1020 PT Time Calculation (min) (ACUTE ONLY): 30 min  Charges:  $Gait Training: 23-37 mins                    G Codes:       Earney Navy, PTA Pager: [phone]     Darliss Cheney 09/24/2017, 10:29 AM

## 2017-09-24 NOTE — Progress Notes (Addendum)
PATIENT ID: Madison Stein  MRN: [id number]  DOB/AGE:  [birth year] / 67 y.o.  1 Day Post-Op Procedure(s) (LRB): TOTAL KNEE ARTHROPLASTY (Right)    PROGRESS NOTE Subjective: Patient is alert, oriented, x1 Nausea, x1 Vomiting, yes passing gas. Taking PO well. Denies SOB, Chest or Calf Pain. Using Incentive Spirometer, PAS in place. Ambulate xfer, Patient reports pain as 3/10.Patient able to perform straight leg raise in bed    Objective: Vital signs in last 24 hours: Vitals:   09/23/17 1530 09/23/17 1545 09/23/17 1640 09/24/17 0541  BP: (!) 103/58 113/64 (!) 120/49 108/80  Pulse: 76 75 82 80  Resp: 14 16  16   Temp: (!) 97.5 F (36.4 C)  97.8 F (36.6 C) 98 F (36.7 C)  TempSrc:   Oral Oral  SpO2: 100% 100% 94% 100%  Weight:      Height:          Intake/Output from previous day: I/O last 3 completed shifts: In: 1194 [P.O.:480; I.V.:875; IV Piggyback:110] Out: 600 [Urine:500; Blood:100]   Intake/Output this shift: No intake/output data recorded.   LABORATORY DATA: Recent Labs    09/23/17 1643 09/23/17 2150 09/24/17 0617  GLUCAP 136* 189* 119*    Examination: Neurologically intact ABD soft Neurovascular intact Sensation intact distally Intact pulses distally Dorsiflexion/Plantar flexion intact Incision: dressing C/D/I No cellulitis present Compartment soft}  Assessment:   1 Day Post-Op Procedure(s) (LRB): TOTAL KNEE ARTHROPLASTY (Right) ADDITIONAL DIAGNOSIS: Expected Acute Blood Loss Anemia,   Plan: PT/OT WBAT, AROM and PROM  DVT Prophylaxis:  SCDx72hrs, ASA 325 mg BID x 2 weeks DISCHARGE PLAN: Home DISCHARGE NEEDS: HHPT, Walker and 3-in-1 comode seat     Kerin Salen 09/24/2017, 7:25 AM

## 2017-09-24 NOTE — Progress Notes (Signed)
Physical Therapy Treatment Patient Details Name: Madison Stein MRN: [id number] DOB: [birth year] Today's Date: 09/24/2017    History of Present Illness Patient admitted for R TKA, PMH positive for L TKA, B THA, fibromyalgia, DM, depression.     PT Comments    Continuing work on functional mobility and activity tolerance;  It has been difficult getting Ms. Milhouse' pain under control this afternoon, at times she has been in tears; Premedicated for pain prior to afternoon session, and she tolerated a set of exercises and progressive amb well; on track for dc tomorrow  Follow Up Recommendations  Follow surgeon's recommendation for DC plan and follow-up therapies     Equipment Recommendations  None recommended by PT    Recommendations for Other Services       Precautions / Restrictions Precautions Precautions: Fall;Knee Precaution Comments: Pt educated to not allow any pillow or bolster under knee for healing with optimal range of motion.  Restrictions RLE Weight Bearing: Weight bearing as tolerated    Mobility  Bed Mobility Overal bed mobility: Needs Assistance Bed Mobility: Supine to Sit     Supine to sit: Min assist     General bed mobility comments: Min assist for RLE  Transfers Overall transfer level: Needs assistance Equipment used: Rolling walker (2 wheeled) Transfers: Sit to/from Stand Sit to Stand: Min guard         General transfer comment: min guard for safety; cues for safe hand placement  Ambulation/Gait Ambulation/Gait assistance: Supervision Ambulation Distance (Feet): 140 Feet Assistive device: Rolling walker (2 wheeled) Gait Pattern/deviations: Step-through pattern;Decreased weight shift to right;Decreased stride length     General Gait Details: cues for sequencing, R knee flexion during swing phase, and step length symmetry; pt with improved step through pattern adn less reliance on UE support with increased distance   Stairs             Wheelchair Mobility    Modified Rankin (Stroke Patients Only)       Balance     Sitting balance-Leahy Scale: Good       Standing balance-Leahy Scale: Poor                              Cognition Arousal/Alertness: Awake/alert Behavior During Therapy: WFL for tasks assessed/performed Overall Cognitive Status: Within Functional Limits for tasks assessed                                        Exercises Total Joint Exercises Ankle Circles/Pumps: AROM;10 reps;Both;Supine Quad Sets: AROM;Right;10 reps Short Arc Quad: AROM;Right;10 reps Straight Leg Raises: AAROM;AROM;10 reps;Right Long Arc Quad: AROM;Right;10 reps Knee Flexion: AROM;Right;10 reps;Seated Goniometric ROM: approx 0-90 deg    General Comments        Pertinent Vitals/Pain Pain Assessment: 0-10 Pain Score: 6  Pain Location: R knee Pain Descriptors / Indicators: Guarding;Sore Pain Intervention(s): Monitored during session;Premedicated before session    Home Living                      Prior Function            PT Goals (current goals can now be found in the care plan section) Acute Rehab PT Goals Patient Stated Goal: to go home PT Goal Formulation: With patient Time For Goal Achievement: 09/27/17 Potential to Achieve Goals: Good Progress  towards PT goals: Progressing toward goals    Frequency    7X/week      PT Plan Current plan remains appropriate    Co-evaluation              AM-PAC PT "6 Clicks" Daily Activity  Outcome Measure  Difficulty turning over in bed (including adjusting bedclothes, sheets and blankets)?: A Little Difficulty moving from lying on back to sitting on the side of the bed? : A Little Difficulty sitting down on and standing up from a chair with arms (e.g., wheelchair, bedside commode, etc,.)?: A Little Help needed moving to and from a bed to chair (including a wheelchair)?: A Little Help needed walking in hospital  room?: A Little Help needed climbing 3-5 steps with a railing? : A Little 6 Click Score: 18    End of Session Equipment Utilized During Treatment: Gait belt Activity Tolerance: Patient tolerated treatment well Patient left: with call bell/phone within reach;in chair;with nursing/sitter in room Nurse Communication: Mobility status PT Visit Diagnosis: Difficulty in walking, not elsewhere classified (R26.2);Pain Pain - Right/Left: Right Pain - part of body: Knee     Time: 1529-1600 PT Time Calculation (min) (ACUTE ONLY): 31 min  Charges:  $Gait Training: 8-22 mins $Therapeutic Exercise: 8-22 mins                    G Codes:       Roney Marion, PT  Acute Rehabilitation Services Pager [phone] Office [place] 09/24/2017, 4:41 PM

## 2017-09-25 LAB — CBC
HCT: 32.9 % — ABNORMAL LOW (ref 36.0–46.0)
HEMOGLOBIN: 10.3 g/dL — AB (ref 12.0–15.0)
MCH: 26.7 pg (ref 26.0–34.0)
MCHC: 31.3 g/dL (ref 30.0–36.0)
MCV: 85.2 fL (ref 78.0–100.0)
Platelets: 229 10*3/uL (ref 150–400)
RBC: 3.86 MIL/uL — ABNORMAL LOW (ref 3.87–5.11)
RDW: 14.2 % (ref 11.5–15.5)
WBC: 11.8 10*3/uL — ABNORMAL HIGH (ref 4.0–10.5)

## 2017-09-25 LAB — GLUCOSE, CAPILLARY: Glucose-Capillary: 111 mg/dL — ABNORMAL HIGH (ref 65–99)

## 2017-09-25 NOTE — Plan of Care (Signed)
  Progressing Education: Knowledge of General Education information will improve 09/25/2017 1015 - Progressing by Rance Muir, RN Health Behavior/Discharge Planning: Ability to manage health-related needs will improve 09/25/2017 1015 - Progressing by Rance Muir, RN Clinical Measurements: Ability to maintain clinical measurements within normal limits will improve 09/25/2017 1015 - Progressing by Rance Muir, RN Will remain free from infection 09/25/2017 1015 - Progressing by Rance Muir, RN Diagnostic test results will improve 09/25/2017 1015 - Progressing by Rance Muir, RN Respiratory complications will improve 09/25/2017 1015 - Progressing by Rance Muir, RN Cardiovascular complication will be avoided 09/25/2017 1015 - Progressing by Rance Muir, RN Activity: Risk for activity intolerance will decrease 09/25/2017 1015 - Progressing by Rance Muir, RN Nutrition: Adequate nutrition will be maintained 09/25/2017 1015 - Progressing by Rance Muir, RN Elimination: Will not experience complications related to bowel motility 09/25/2017 1015 - Progressing by Rance Muir, RN Will not experience complications related to urinary retention 09/25/2017 1015 - Progressing by Rance Muir, RN Pain Managment: General experience of comfort will improve 09/25/2017 1015 - Progressing by Rance Muir, RN Safety: Ability to remain free from injury will improve 09/25/2017 1015 - Progressing by Rance Muir, RN Skin Integrity: Risk for impaired skin integrity will decrease 09/25/2017 1015 - Progressing by Rance Muir, RN Education: Knowledge of the prescribed therapeutic regimen will improve 09/25/2017 1015 - Progressing by Rance Muir, RN Activity: Ability to avoid complications of mobility impairment will improve 09/25/2017 1015 - Progressing by Rance Muir, RN Range of joint motion will improve 09/25/2017 1015 - Progressing by Rance Muir, RN Clinical Measurements: Postoperative complications will be avoided or minimized 09/25/2017 1015 -  Progressing by Rance Muir, RN Pain Management: Pain level will decrease with appropriate interventions 09/25/2017 1015 - Progressing by Rance Muir, RN Skin Integrity: Signs of wound healing will improve 09/25/2017 1015 - Progressing by Rance Muir, RN

## 2017-09-25 NOTE — Discharge Summary (Signed)
Patient ID: Madison Stein MRN: [id number] DOB/AGE: 65/05/1951 65 y.o.  Admit date: 09/23/2017 Discharge date: 09/25/2017  Admission Diagnoses:  Principal Problem:   Osteoarthritis of right knee Active Problems:   Primary osteoarthritis of right knee   Discharge Diagnoses:  Same  Past Medical History:  Diagnosis Date  . Arthritis   . Bronchitis 2013   Hx of  . Complication of anesthesia   . Depression    takes Cymbalta daily  . Depression   . Diabetes mellitus without complication ([place])    takes Metformin daily  . Diabetes mellitus without complication ([place])    Type II  . Family history of adverse reaction to anesthesia    patients mother and sister gets sick  . Family history of anesthesia complication    mom and sister get very sick  . Fibromyalgia   . History of bronchitis 75yrs ago  . History of shingles   . Hyperlipidemia    takes Fish Oil bid  . Hyperlipidemia   . Insomnia    has Ambien if needed  . Joint pain   . Peripheral neuropathy    takes Lyrica daily  . Pneumonia 65yrs ago   hx of  . Pneumonia    hx of  . PONV (postoperative nausea and vomiting)   . Thyroid nodule   . Urgency of urination    Leakage wears pad    Surgeries: Procedure(s): TOTAL KNEE ARTHROPLASTY on 09/23/2017   Consultants:   Discharged Condition: Improved  Hospital Course: Madison Stein is an 67 y.o. female who was admitted 09/23/2017 for operative treatment ofOsteoarthritis of right knee. Patient has severe unremitting pain that affects sleep, daily activities, and work/hobbies. After pre-op clearance the patient was taken to the operating room on 09/23/2017 and underwent  Procedure(s): TOTAL KNEE ARTHROPLASTY.    Patient was given perioperative antibiotics:  Anti-infectives (From admission, onward)   Start     Dose/Rate Route Frequency Ordered Stop   09/23/17 1035  ceFAZolin (ANCEF) IVPB 2g/100 mL premix     2 g 200 mL/hr over 30 Minutes Intravenous On call to O.R.  09/23/17 1035 09/23/17 1215       Patient was given sequential compression devices, early ambulation, and chemoprophylaxis to prevent DVT.  Patient benefited maximally from hospital stay and there were no complications.    Recent vital signs:  Patient Vitals for the past 24 hrs:  BP Temp Temp src Pulse Resp SpO2  09/25/17 0620 (!) 104/48 98.3 F (36.8 C) Oral 99 17 99 %  09/24/17 2000 (!) 122/55 98 F (36.7 C) Oral - 17 100 %  09/24/17 1300 (!) 127/54 98.5 F (36.9 C) Oral (!) 105 16 97 %  09/24/17 1201 - (!) 97.3 F (36.3 C) - - 16 -  09/24/17 1006 (!) 107/43 99.4 F (37.4 C) Oral 100 16 97 %     Recent laboratory studies:  Recent Labs    09/24/17 0738 09/25/17 0615  WBC 13.3* 11.8*  HGB 11.0* 10.3*  HCT 34.6* 32.9*  PLT 268 229  NA 135  --   K 4.6  --   CL 102  --   CO2 26  --   BUN 10  --   CREATININE 0.89  --   GLUCOSE 153*  --   CALCIUM 8.5*  --      Discharge Medications:   Allergies as of 09/25/2017      Reactions   Morphine And Related Nausea And Vomiting   "  just don't tolerate it well"   Percocet [oxycodone-acetaminophen] Itching      Medication List    STOP taking these medications   ARTHROTEC 75-0.2 MG Tbec Generic drug:  Diclofenac-miSOPROStol     TAKE these medications   aspirin EC 325 MG tablet Take 1 tablet (325 mg total) by mouth 2 (two) times daily.   DULoxetine 60 MG capsule Commonly known as:  CYMBALTA Take 60 mg by mouth 2 (two) times daily.   fenofibrate 160 MG tablet Take 160 mg by mouth daily.   gabapentin 300 MG capsule Commonly known as:  NEURONTIN Take 300-600 mg by mouth 2 (two) times daily. Take 1 capsule (300 mg) in the morning & 2 capsules (600 mg) at night.   glimepiride 4 MG tablet Commonly known as:  AMARYL Take 4 mg by mouth 2 (two) times daily.   HYDROmorphone 2 MG tablet Commonly known as:  DILAUDID Take 1 tablet (2 mg total) by mouth every 4 (four) hours as needed for severe pain.   JANUMET XR  [phone] MG Tb24 Generic drug:  SitaGLIPtin-MetFORMIN HCl Take 1 tablet by mouth daily with supper.   methocarbamol 750 MG tablet Commonly known as:  ROBAXIN Take 1 tablet (750 mg total) by mouth 4 (four) times daily as needed for muscle spasms.   multivitamin with minerals Tabs tablet Take 1 tablet by mouth daily.   TYLENOL 8 HOUR ARTHRITIS PAIN 650 MG CR tablet Generic drug:  acetaminophen Take 650-1,300 mg by mouth 2 (two) times daily as needed for pain.            Durable Medical Equipment  (From admission, onward)        Start     Ordered   09/23/17 1603  DME Walker rolling  Once    Question:  Patient needs a walker to treat with the following condition  Answer:  Status post right knee replacement   09/23/17 1603   09/23/17 1603  DME 3 n 1  Once     09/23/17 1603   09/23/17 1603  DME Bedside commode  Once    Question:  Patient needs a bedside commode to treat with the following condition  Answer:  Status post right knee replacement   09/23/17 1603       Discharge Care Instructions  (From admission, onward)        Start     Ordered   09/25/17 0000  Weight bearing as tolerated     09/25/17 0754      Diagnostic Studies: Dg Chest 2 View  Result Date: 09/12/2017 CLINICAL DATA:  Preop for knee replacement. EXAM: CHEST - 2 VIEW COMPARISON:  Radiographs of Nov 16, 2013. FINDINGS: The heart size and mediastinal contours are within normal limits. Both lungs are clear. The visualized skeletal structures are unremarkable. IMPRESSION: No active cardiopulmonary disease. Electronically Signed   By: Marijo Conception, M.D.   On: 09/12/2017 16:45    Disposition: Discharge disposition: 01-Home or Self Care       Discharge Instructions    Call MD / Call 911   Complete by:  As directed    If you experience chest pain or shortness of breath, CALL 911 and be transported to the hospital emergency room.  If you develope a fever above 101 F, pus (white drainage) or increased  drainage or redness at the wound, or calf pain, call your surgeon's office.   Constipation Prevention   Complete by:  As directed  Drink plenty of fluids.  Prune juice may be helpful.  You may use a stool softener, such as Colace (over the counter) 100 mg twice a day.  Use MiraLax (over the counter) for constipation as needed.   Diet - low sodium heart healthy   Complete by:  As directed    Driving restrictions   Complete by:  As directed    No driving for 2 weeks   Increase activity slowly as tolerated   Complete by:  As directed    Patient may shower   Complete by:  As directed    You may shower without a dressing once there is no drainage.  Do not wash over the wound.  If drainage remains, cover wound with plastic wrap and then shower.   Weight bearing as tolerated   Complete by:  As directed       Follow-up Information    Frederik Pear, MD Follow up in 2 week(s).   Specialty:  Orthopedic Surgery Contact information: [address] [place] [address] [phone]            Signed: Joanell Rising 09/25/2017, 7:54 AM

## 2017-09-25 NOTE — Progress Notes (Signed)
Physical Therapy Treatment Patient Details Name: Madison Stein MRN: [id number] DOB: 31-Oct-1950 Today's Date: 09/25/2017    History of Present Illness Patient admitted for R TKA, PMH positive for L TKA, B THA, fibromyalgia, DM, depression.     PT Comments    Patient is making good progress with PT.  From a mobility standpoint anticipate patient will be ready for DC home when medically ready.    Follow Up Recommendations  Follow surgeon's recommendation for DC plan and follow-up therapies     Equipment Recommendations  None recommended by PT    Recommendations for Other Services       Precautions / Restrictions Precautions Precautions: Fall;Knee Precaution Comments: knee precautions reviewed with pt Restrictions Weight Bearing Restrictions: Yes RLE Weight Bearing: Weight bearing as tolerated    Mobility  Bed Mobility               General bed mobility comments: pt OOB in chair upon arrival  Transfers Overall transfer level: Needs assistance Equipment used: Rolling walker (2 wheeled) Transfers: Sit to/from Stand Sit to Stand: Min guard         General transfer comment: min guard for safety; safe hand placement demonstrated  Ambulation/Gait Ambulation/Gait assistance: Supervision Ambulation Distance (Feet): 160 Feet Assistive device: Rolling walker (2 wheeled) Gait Pattern/deviations: Step-through pattern;Decreased weight shift to right;Decreased stride length     General Gait Details: cues for posture and increased stride lenght   Stairs         General stair comments: verbally reviewed stair training and handout given to pt  Wheelchair Mobility    Modified Rankin (Stroke Patients Only)       Balance     Sitting balance-Leahy Scale: Good       Standing balance-Leahy Scale: Poor                              Cognition Arousal/Alertness: Awake/alert Behavior During Therapy: WFL for tasks assessed/performed Overall  Cognitive Status: Within Functional Limits for tasks assessed                                        Exercises Total Joint Exercises Ankle Circles/Pumps: AROM;Both;15 reps Quad Sets: AROM;Right;10 reps Short Arc Quad: AROM;Right;10 reps Heel Slides: AAROM;Right;10 reps Hip ABduction/ADduction: AROM;Right;10 reps Straight Leg Raises: AAROM;Right;5 reps    General Comments General comments (skin integrity, edema, etc.): sister present in room      Pertinent Vitals/Pain Pain Assessment: Faces Faces Pain Scale: Hurts little more Pain Location: R knee Pain Descriptors / Indicators: Guarding;Sore Pain Intervention(s): Limited activity within patient's tolerance;Monitored during session;Repositioned;Premedicated before session    Home Living                      Prior Function            PT Goals (current goals can now be found in the care plan section) Acute Rehab PT Goals Patient Stated Goal: to go home PT Goal Formulation: With patient Time For Goal Achievement: 09/27/17 Potential to Achieve Goals: Good Progress towards PT goals: Progressing toward goals    Frequency    7X/week      PT Plan Current plan remains appropriate    Co-evaluation              AM-PAC PT "6 Clicks" Daily  Activity  Outcome Measure  Difficulty turning over in bed (including adjusting bedclothes, sheets and blankets)?: A Little Difficulty moving from lying on back to sitting on the side of the bed? : A Little Difficulty sitting down on and standing up from a chair with arms (e.g., wheelchair, bedside commode, etc,.)?: A Little Help needed moving to and from a bed to chair (including a wheelchair)?: A Little Help needed walking in hospital room?: A Little Help needed climbing 3-5 steps with a railing? : A Little 6 Click Score: 18    End of Session Equipment Utilized During Treatment: Gait belt Activity Tolerance: Patient tolerated treatment well Patient  left: with call bell/phone within reach;in chair;with family/visitor present Nurse Communication: Mobility status PT Visit Diagnosis: Difficulty in walking, not elsewhere classified (R26.2);Pain Pain - Right/Left: Right Pain - part of body: Knee     Time: 8016-5537 PT Time Calculation (min) (ACUTE ONLY): 27 min  Charges:  $Gait Training: 8-22 mins $Therapeutic Exercise: 8-22 mins                    G Codes:       Earney Navy, PTA Pager: [phone]     Darliss Cheney 09/25/2017, 12:06 PM

## 2017-09-25 NOTE — Care Management Note (Signed)
Case Management Note  Patient Details  Name: IGNACIA GENTZLER MRN: [id number] Date of Birth: Nov 25, 1950  Subjective/Objective:  67 yr old female s/p right total knee arthroplasty.                  Action/Plan: Case manager spoke with patient concerning discharge plan and DME. Choice for Home Health agency was offered, refpatient says she has used [facility] in the past, wishes to do so now. CM called referral to Neoma Laming, [place] Liaison. Patient will have family support at discharge.    Expected Discharge Date:  09/25/17               Expected Discharge Plan:  [place]  In-House Referral:  NA  Discharge planning Services  CM Consult  Post Acute Care Choice:  Home Health, Durable Medical Equipment Choice offered to:  Patient  DME Arranged:  3-N-1, Walker rolling DME Agency:  [employer]  HH Arranged:  PT [place]:  [facility]  Status of Service:  Completed, signed off  If discussed at [place] of Stay Meetings, dates discussed:    Additional Comments:  Ninfa Meeker, RN 09/25/2017, 12:43 PM

## 2017-09-25 NOTE — Progress Notes (Signed)
PATIENT ID: Madison Stein  MRN: [id number]  DOB/AGE:  65/22/1952 / 67 y.o.  2 Days Post-Op Procedure(s) (LRB): TOTAL KNEE ARTHROPLASTY (Right)    PROGRESS NOTE Subjective: Patient is alert, oriented, no Nausea, no Vomiting, yes passing gas. Taking PO well. Denies SOB, Chest or Calf Pain. Using Incentive Spirometer, PAS in place. Ambulate WBAT with pt walking 140 ft with therapy, Patient reports pain as 4/10 .    Objective: Vital signs in last 24 hours: Vitals:   09/24/17 1201 09/24/17 1300 09/24/17 2000 09/25/17 0620  BP:  (!) 127/54 (!) 122/55 (!) 104/48  Pulse:  (!) 105  99  Resp: 16 16 17 17   Temp: (!) 97.3 F (36.3 C) 98.5 F (36.9 C) 98 F (36.7 C) 98.3 F (36.8 C)  TempSrc:  Oral Oral Oral  SpO2:  97% 100% 99%  Weight:      Height:          Intake/Output from previous day: I/O last 3 completed shifts: In: 3040 [P.O.:3040] Out: -    Intake/Output this shift: No intake/output data recorded.   LABORATORY DATA: Recent Labs    09/24/17 0738  09/24/17 1735 09/24/17 2114 09/25/17 0615 09/25/17 0618  WBC 13.3*  --   --   --  11.8*  --   HGB 11.0*  --   --   --  10.3*  --   HCT 34.6*  --   --   --  32.9*  --   PLT 268  --   --   --  229  --   NA 135  --   --   --   --   --   K 4.6  --   --   --   --   --   CL 102  --   --   --   --   --   CO2 26  --   --   --   --   --   BUN 10  --   --   --   --   --   CREATININE 0.89  --   --   --   --   --   GLUCOSE 153*  --   --   --   --   --   GLUCAP  --    < > 222* 184*  --  111*  CALCIUM 8.5*  --   --   --   --   --    < > = values in this interval not displayed.    Examination: Neurologically intact Neurovascular intact Sensation intact distally Intact pulses distally Dorsiflexion/Plantar flexion intact Incision: dressing C/D/I No cellulitis present Compartment soft}  Assessment:   2 Days Post-Op Procedure(s) (LRB): TOTAL KNEE ARTHROPLASTY (Right) ADDITIONAL DIAGNOSIS: Expected Acute Blood Loss Anemia,    Plan: PT/OT WBAT, AROM and PROM  DVT Prophylaxis:  SCDx72hrs, ASA 325 mg BID x 2 weeks DISCHARGE PLAN: Home DISCHARGE NEEDS: HHPT, Walker and 3-in-1 comode seat     Joanell Rising 09/25/2017, 7:52 AM

## 2017-09-25 NOTE — Plan of Care (Signed)
  Adequate for Discharge Education: Knowledge of General Education information will improve 09/25/2017 1254 - Adequate for Discharge by Rance Muir, RN 09/25/2017 1015 - Progressing by Rance Muir, RN Health Behavior/Discharge Planning: Ability to manage health-related needs will improve 09/25/2017 1254 - Adequate for Discharge by Rance Muir, RN 09/25/2017 1015 - Progressing by Rance Muir, RN Clinical Measurements: Ability to maintain clinical measurements within normal limits will improve 09/25/2017 1254 - Adequate for Discharge by Rance Muir, RN 09/25/2017 1015 - Progressing by Rance Muir, RN Will remain free from infection 09/25/2017 1254 - Adequate for Discharge by Rance Muir, RN 09/25/2017 1015 - Progressing by Rance Muir, RN Diagnostic test results will improve 09/25/2017 1254 - Adequate for Discharge by Rance Muir, RN 09/25/2017 1015 - Progressing by Rance Muir, RN Respiratory complications will improve 09/25/2017 1254 - Adequate for Discharge by Rance Muir, RN 09/25/2017 1015 - Progressing by Rance Muir, RN Cardiovascular complication will be avoided 09/25/2017 1254 - Adequate for Discharge by Rance Muir, RN 09/25/2017 1015 - Progressing by Rance Muir, RN Activity: Risk for activity intolerance will decrease 09/25/2017 1254 - Adequate for Discharge by Rance Muir, RN 09/25/2017 1015 - Progressing by Rance Muir, RN Nutrition: Adequate nutrition will be maintained 09/25/2017 1254 - Adequate for Discharge by Rance Muir, RN 09/25/2017 1015 - Progressing by Rance Muir, RN Elimination: Will not experience complications related to bowel motility 09/25/2017 1254 - Adequate for Discharge by Rance Muir, RN 09/25/2017 1015 - Progressing by Rance Muir, RN Will not experience complications related to urinary retention 09/25/2017 1254 - Adequate for Discharge by Rance Muir, RN 09/25/2017 1015 - Progressing by Rance Muir, RN Pain Managment: General experience of comfort will improve 09/25/2017 1254 - Adequate for Discharge by  Rance Muir, RN 09/25/2017 1015 - Progressing by Rance Muir, RN Safety: Ability to remain free from injury will improve 09/25/2017 1254 - Adequate for Discharge by Rance Muir, RN 09/25/2017 1015 - Progressing by Rance Muir, RN Skin Integrity: Risk for impaired skin integrity will decrease 09/25/2017 1254 - Adequate for Discharge by Rance Muir, RN 09/25/2017 1015 - Progressing by Rance Muir, RN Education: Knowledge of the prescribed therapeutic regimen will improve 09/25/2017 1254 - Adequate for Discharge by Rance Muir, RN 09/25/2017 1015 - Progressing by Rance Muir, RN Activity: Ability to avoid complications of mobility impairment will improve 09/25/2017 1254 - Adequate for Discharge by Rance Muir, RN 09/25/2017 1015 - Progressing by Rance Muir, RN Range of joint motion will improve 09/25/2017 1254 - Adequate for Discharge by Rance Muir, RN 09/25/2017 1015 - Progressing by Rance Muir, RN Clinical Measurements: Postoperative complications will be avoided or minimized 09/25/2017 1254 - Adequate for Discharge by Rance Muir, RN 09/25/2017 1015 - Progressing by Rance Muir, RN Pain Management: Pain level will decrease with appropriate interventions 09/25/2017 1254 - Adequate for Discharge by Rance Muir, RN 09/25/2017 1015 - Progressing by Rance Muir, RN Skin Integrity: Signs of wound healing will improve 09/25/2017 1254 - Adequate for Discharge by Rance Muir, RN 09/25/2017 1015 - Progressing by Rance Muir, RN

## 2017-09-26 DIAGNOSIS — E1151 Type 2 diabetes mellitus with diabetic peripheral angiopathy without gangrene: Secondary | ICD-10-CM | POA: Diagnosis not present

## 2017-09-26 DIAGNOSIS — Z471 Aftercare following joint replacement surgery: Secondary | ICD-10-CM | POA: Diagnosis not present

## 2017-09-26 DIAGNOSIS — Z7982 Long term (current) use of aspirin: Secondary | ICD-10-CM | POA: Diagnosis not present

## 2017-09-26 DIAGNOSIS — Z96651 Presence of right artificial knee joint: Secondary | ICD-10-CM | POA: Diagnosis not present

## 2017-09-26 DIAGNOSIS — E785 Hyperlipidemia, unspecified: Secondary | ICD-10-CM | POA: Diagnosis not present

## 2017-09-26 DIAGNOSIS — F329 Major depressive disorder, single episode, unspecified: Secondary | ICD-10-CM | POA: Diagnosis not present

## 2017-09-26 DIAGNOSIS — Z7984 Long term (current) use of oral hypoglycemic drugs: Secondary | ICD-10-CM | POA: Diagnosis not present

## 2017-09-26 DIAGNOSIS — R3915 Urgency of urination: Secondary | ICD-10-CM | POA: Diagnosis not present

## 2017-10-08 DIAGNOSIS — M1711 Unilateral primary osteoarthritis, right knee: Secondary | ICD-10-CM | POA: Diagnosis not present

## 2017-10-15 DIAGNOSIS — Z96651 Presence of right artificial knee joint: Secondary | ICD-10-CM | POA: Diagnosis not present

## 2017-10-15 DIAGNOSIS — M6281 Muscle weakness (generalized): Secondary | ICD-10-CM | POA: Diagnosis not present

## 2017-10-15 DIAGNOSIS — R2689 Other abnormalities of gait and mobility: Secondary | ICD-10-CM | POA: Diagnosis not present

## 2017-10-15 DIAGNOSIS — Z471 Aftercare following joint replacement surgery: Secondary | ICD-10-CM | POA: Diagnosis not present

## 2017-10-18 DIAGNOSIS — R2689 Other abnormalities of gait and mobility: Secondary | ICD-10-CM | POA: Diagnosis not present

## 2017-10-18 DIAGNOSIS — M6281 Muscle weakness (generalized): Secondary | ICD-10-CM | POA: Diagnosis not present

## 2017-10-18 DIAGNOSIS — Z471 Aftercare following joint replacement surgery: Secondary | ICD-10-CM | POA: Diagnosis not present

## 2017-10-18 DIAGNOSIS — Z96651 Presence of right artificial knee joint: Secondary | ICD-10-CM | POA: Diagnosis not present

## 2017-10-22 DIAGNOSIS — R2689 Other abnormalities of gait and mobility: Secondary | ICD-10-CM | POA: Diagnosis not present

## 2017-10-22 DIAGNOSIS — Z471 Aftercare following joint replacement surgery: Secondary | ICD-10-CM | POA: Diagnosis not present

## 2017-10-22 DIAGNOSIS — M6281 Muscle weakness (generalized): Secondary | ICD-10-CM | POA: Diagnosis not present

## 2017-10-22 DIAGNOSIS — Z96651 Presence of right artificial knee joint: Secondary | ICD-10-CM | POA: Diagnosis not present

## 2017-10-24 DIAGNOSIS — Z471 Aftercare following joint replacement surgery: Secondary | ICD-10-CM | POA: Diagnosis not present

## 2017-10-24 DIAGNOSIS — Z96651 Presence of right artificial knee joint: Secondary | ICD-10-CM | POA: Diagnosis not present

## 2017-10-24 DIAGNOSIS — R2689 Other abnormalities of gait and mobility: Secondary | ICD-10-CM | POA: Diagnosis not present

## 2017-10-24 DIAGNOSIS — M6281 Muscle weakness (generalized): Secondary | ICD-10-CM | POA: Diagnosis not present

## 2017-10-29 DIAGNOSIS — M6281 Muscle weakness (generalized): Secondary | ICD-10-CM | POA: Diagnosis not present

## 2017-10-29 DIAGNOSIS — Z96651 Presence of right artificial knee joint: Secondary | ICD-10-CM | POA: Diagnosis not present

## 2017-10-29 DIAGNOSIS — R2689 Other abnormalities of gait and mobility: Secondary | ICD-10-CM | POA: Diagnosis not present

## 2017-10-29 DIAGNOSIS — Z471 Aftercare following joint replacement surgery: Secondary | ICD-10-CM | POA: Diagnosis not present

## 2017-11-05 DIAGNOSIS — Z471 Aftercare following joint replacement surgery: Secondary | ICD-10-CM | POA: Diagnosis not present

## 2017-11-05 DIAGNOSIS — M1711 Unilateral primary osteoarthritis, right knee: Secondary | ICD-10-CM | POA: Diagnosis not present

## 2017-11-05 DIAGNOSIS — Z96651 Presence of right artificial knee joint: Secondary | ICD-10-CM | POA: Diagnosis not present

## 2017-11-08 DIAGNOSIS — Z471 Aftercare following joint replacement surgery: Secondary | ICD-10-CM | POA: Diagnosis not present

## 2017-11-08 DIAGNOSIS — M6281 Muscle weakness (generalized): Secondary | ICD-10-CM | POA: Diagnosis not present

## 2017-11-08 DIAGNOSIS — Z96651 Presence of right artificial knee joint: Secondary | ICD-10-CM | POA: Diagnosis not present

## 2017-11-08 DIAGNOSIS — R2689 Other abnormalities of gait and mobility: Secondary | ICD-10-CM | POA: Diagnosis not present

## 2017-11-11 DIAGNOSIS — R2689 Other abnormalities of gait and mobility: Secondary | ICD-10-CM | POA: Diagnosis not present

## 2017-11-11 DIAGNOSIS — Z96651 Presence of right artificial knee joint: Secondary | ICD-10-CM | POA: Diagnosis not present

## 2017-11-11 DIAGNOSIS — M6281 Muscle weakness (generalized): Secondary | ICD-10-CM | POA: Diagnosis not present

## 2017-11-11 DIAGNOSIS — Z471 Aftercare following joint replacement surgery: Secondary | ICD-10-CM | POA: Diagnosis not present

## 2017-11-13 DIAGNOSIS — M6281 Muscle weakness (generalized): Secondary | ICD-10-CM | POA: Diagnosis not present

## 2017-11-13 DIAGNOSIS — Z471 Aftercare following joint replacement surgery: Secondary | ICD-10-CM | POA: Diagnosis not present

## 2017-11-13 DIAGNOSIS — R2689 Other abnormalities of gait and mobility: Secondary | ICD-10-CM | POA: Diagnosis not present

## 2017-11-13 DIAGNOSIS — Z96651 Presence of right artificial knee joint: Secondary | ICD-10-CM | POA: Diagnosis not present

## 2017-11-14 DIAGNOSIS — M353 Polymyalgia rheumatica: Secondary | ICD-10-CM | POA: Diagnosis not present

## 2017-11-14 DIAGNOSIS — E1142 Type 2 diabetes mellitus with diabetic polyneuropathy: Secondary | ICD-10-CM | POA: Diagnosis not present

## 2017-11-14 DIAGNOSIS — E781 Pure hyperglyceridemia: Secondary | ICD-10-CM | POA: Diagnosis not present

## 2017-11-14 DIAGNOSIS — F418 Other specified anxiety disorders: Secondary | ICD-10-CM | POA: Diagnosis not present

## 2017-11-18 DIAGNOSIS — E875 Hyperkalemia: Secondary | ICD-10-CM | POA: Diagnosis not present

## 2017-11-19 DIAGNOSIS — Z96651 Presence of right artificial knee joint: Secondary | ICD-10-CM | POA: Diagnosis not present

## 2017-11-19 DIAGNOSIS — M6281 Muscle weakness (generalized): Secondary | ICD-10-CM | POA: Diagnosis not present

## 2017-11-19 DIAGNOSIS — R2689 Other abnormalities of gait and mobility: Secondary | ICD-10-CM | POA: Diagnosis not present

## 2017-11-19 DIAGNOSIS — Z471 Aftercare following joint replacement surgery: Secondary | ICD-10-CM | POA: Diagnosis not present

## 2017-11-22 DIAGNOSIS — M6281 Muscle weakness (generalized): Secondary | ICD-10-CM | POA: Diagnosis not present

## 2017-11-22 DIAGNOSIS — Z471 Aftercare following joint replacement surgery: Secondary | ICD-10-CM | POA: Diagnosis not present

## 2017-11-22 DIAGNOSIS — Z96651 Presence of right artificial knee joint: Secondary | ICD-10-CM | POA: Diagnosis not present

## 2017-11-22 DIAGNOSIS — R2689 Other abnormalities of gait and mobility: Secondary | ICD-10-CM | POA: Diagnosis not present

## 2017-11-26 DIAGNOSIS — Z96651 Presence of right artificial knee joint: Secondary | ICD-10-CM | POA: Diagnosis not present

## 2017-11-26 DIAGNOSIS — R2689 Other abnormalities of gait and mobility: Secondary | ICD-10-CM | POA: Diagnosis not present

## 2017-11-26 DIAGNOSIS — E875 Hyperkalemia: Secondary | ICD-10-CM | POA: Diagnosis not present

## 2017-11-26 DIAGNOSIS — Z471 Aftercare following joint replacement surgery: Secondary | ICD-10-CM | POA: Diagnosis not present

## 2017-11-26 DIAGNOSIS — M6281 Muscle weakness (generalized): Secondary | ICD-10-CM | POA: Diagnosis not present

## 2017-12-03 DIAGNOSIS — Z96651 Presence of right artificial knee joint: Secondary | ICD-10-CM | POA: Diagnosis not present

## 2017-12-03 DIAGNOSIS — Z471 Aftercare following joint replacement surgery: Secondary | ICD-10-CM | POA: Diagnosis not present

## 2017-12-03 DIAGNOSIS — R2689 Other abnormalities of gait and mobility: Secondary | ICD-10-CM | POA: Diagnosis not present

## 2017-12-03 DIAGNOSIS — M6281 Muscle weakness (generalized): Secondary | ICD-10-CM | POA: Diagnosis not present

## 2017-12-11 DIAGNOSIS — E875 Hyperkalemia: Secondary | ICD-10-CM | POA: Diagnosis not present

## 2017-12-17 DIAGNOSIS — Z96651 Presence of right artificial knee joint: Secondary | ICD-10-CM | POA: Diagnosis not present

## 2017-12-17 DIAGNOSIS — R2689 Other abnormalities of gait and mobility: Secondary | ICD-10-CM | POA: Diagnosis not present

## 2017-12-17 DIAGNOSIS — M6281 Muscle weakness (generalized): Secondary | ICD-10-CM | POA: Diagnosis not present

## 2017-12-17 DIAGNOSIS — Z471 Aftercare following joint replacement surgery: Secondary | ICD-10-CM | POA: Diagnosis not present

## 2017-12-18 DIAGNOSIS — E875 Hyperkalemia: Secondary | ICD-10-CM | POA: Diagnosis not present

## 2018-01-01 DIAGNOSIS — E875 Hyperkalemia: Secondary | ICD-10-CM | POA: Diagnosis not present

## 2018-02-20 NOTE — Progress Notes (Signed)
Office Visit Note  Patient: Madison Stein             Date of Birth: 07/09/1951           MRN: [id number]             PCP: Carlton Adam PA-C Referring: Darrol Jump, PA-C Visit Date: 03/05/2018 Occupation: @GUAROCC @  Subjective:  Pain in multiple joints and muscles..   History of Present Illness: Madison Stein is a 67 y.o. female seen in consultation per request of her PCP.  Patient was seen by me several years ago in June 2014 at the time she presented with pain in multiple joints.  She was initially evaluated by Dr. Youlanda Mighty and was diagnosed with polymyalgia rheumatica.  She was given prednisone but it elevated her blood glucose and it was discontinued.  She was placed on Mobic which was not helpful.  Then she switched to ibuprofen and hydrocodone and continued the medication.  The diagnosis of polymyalgia was doubtful that she improved without taking prednisone.  She was started on Cymbalta which helped.  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  When I evaluated her she had x-rays and she was diagnosed with osteoarthritis of bilateral hands, osteoarthritis of bilateral knees and osteoarthritis of bilateral feet.  After that she did not come for follow-up visits.  She was seen by orthopedic surgeons and she underwent bilateral total hip replacement by Dr. Mayer Camel and later bilateral total knee replacements.  He had good results to the joint replacements.  At her initial visit she had rheumatoid factor which was negative, ANA which was negative and uric acid which was normal.  She states she has done quite well over the last few years.  She was taking Arthrotec which was helpful and she has been taking it for the last 3 years.  It was recently discontinued due to hyperkalemia.  She was switched to Tylenol about 6 weeks ago which is not as effective.  She states she has been having a lot of discomfort in her hands  especially her left thumb.  She also has discomfort in her right foot.  She experiences generalized pain from fibromyalgia.  She is on Cymbalta and Neurontin, Robaxin which she takes on PRN basis.  Activities of Daily Living:  Patient reports morning stiffness for 5 minutes.   Patient Reports nocturnal pain.  Difficulty dressing/grooming: Denies Difficulty climbing stairs: Denies Difficulty getting out of chair: Denies Difficulty using hands for taps, buttons, cutlery, and/or writing: Reports  Review of Systems  Constitutional: Positive for fatigue. Negative for night sweats, weight gain and weight loss.  HENT: Negative for mouth sores, trouble swallowing, trouble swallowing, mouth dryness and nose dryness.   Eyes: Negative for pain, redness, visual disturbance and dryness.  Respiratory: Negative for cough, shortness of breath and difficulty breathing.   Cardiovascular: Negative for chest pain, palpitations, hypertension, irregular heartbeat and swelling in legs/feet.  Gastrointestinal: Negative for blood in stool, constipation and diarrhea.  Endocrine: Negative for increased urination.  Genitourinary: Negative for vaginal dryness.  Musculoskeletal: Positive for arthralgias, joint pain, myalgias, morning stiffness and myalgias. Negative for joint swelling, muscle weakness and muscle tenderness.  Skin: Negative for color change, rash, hair loss, skin tightness, ulcers and sensitivity to sunlight.  Allergic/Immunologic: Negative for susceptible to infections.  Neurological: Negative for dizziness, memory loss, night sweats and weakness.  Hematological: Negative for swollen glands.  Psychiatric/Behavioral:  Negative for depressed mood and sleep disturbance. The patient is not nervous/anxious.     PMFS History:  Patient Active Problem List   Diagnosis Date Noted  . Fibromyalgia 03/05/2018  . Primary osteoarthritis of both hands 03/05/2018  . History of total replacement of both hip joints  03/05/2018  . Status post bilateral knee replacements 03/05/2018  . Primary osteoarthritis of both feet 03/05/2018  . DDD (degenerative disc disease), cervical 03/05/2018  . DDD (degenerative disc disease), lumbar 03/05/2018  . History of peripheral neuropathy 03/05/2018  . History of diabetes mellitus 03/05/2018  . History of hyperlipidemia 03/05/2018  . Other insomnia 03/05/2018  . History of depression 03/05/2018  . Family history of rheumatoid arthritis 03/05/2018  . Osteoarthritis of right knee 09/20/2017    Past Medical History:  Diagnosis Date  . Arthritis   . Bronchitis 2013   Hx of  . Complication of anesthesia   . Depression    takes Cymbalta daily  . Depression   . Diabetes mellitus without complication ([place])    takes Metformin daily  . Diabetes mellitus without complication ([place])    Type II  . Family history of adverse reaction to anesthesia    patients mother and sister gets sick  . Family history of anesthesia complication    mom and sister get very sick  . Fibromyalgia   . History of bronchitis 41yrs ago  . History of shingles   . Hyperlipidemia    takes Fish Oil bid  . Hyperlipidemia   . Insomnia    has Ambien if needed  . Joint pain   . Peripheral neuropathy    takes Lyrica daily  . Pneumonia 37yrs ago   hx of  . Pneumonia    hx of  . PONV (postoperative nausea and vomiting)   . Thyroid nodule   . Urgency of urination    Leakage wears pad    Family History  Problem Relation Age of Onset  . Alzheimer's disease Mother   . Kidney failure Father   . Asthma Daughter   . Healthy Daughter    Past Surgical History:  Procedure Laterality Date  . BACK SURGERY  1989  . BACK SURGERY    . CARPAL TUNNEL RELEASE Right 1985  . CARPAL TUNNEL RELEASE Right   . CERVICAL FUSION  2009  . CERVICAL FUSION    . CHOLECYSTECTOMY  2007  . CHOLECYSTECTOMY    . COLONOSCOPY    . ESOPHAGOGASTRODUODENOSCOPY    . EYE SURGERY Bilateral 2007   laser eye surgery   .  HIP ARTHROPLASTY Bilateral   . JOINT REPLACEMENT Bilateral    knees  . JOINT REPLACEMENT Bilateral    hips   . REFRACTIVE SURGERY  2008  . TOTAL HIP ARTHROPLASTY Right 11/20/2013   Procedure: RIGHT TOTAL HIP ARTHROPLASTY;  Surgeon: Kerin Salen, MD;  Location: [facility];  Service: Orthopedics;  Laterality: Right;  . TOTAL HIP ARTHROPLASTY Left 08/30/2014   Procedure: TOTAL HIP ARTHROPLASTY;  Surgeon: Kerin Salen, MD;  Location: [place];  Service: Orthopedics;  Laterality: Left;  . TOTAL KNEE ARTHROPLASTY Left 06/06/2015   Procedure: TOTAL KNEE ARTHROPLASTY;  Surgeon: Frederik Pear, MD;  Location: [place];  Service: Orthopedics;  Laterality: Left;  . TOTAL KNEE ARTHROPLASTY Right 09/23/2017  . TOTAL KNEE ARTHROPLASTY Right 09/23/2017   Procedure: TOTAL KNEE ARTHROPLASTY;  Surgeon: Frederik Pear, MD;  Location: [place];  Service: Orthopedics;  Laterality: Right;  . TUBAL LIGATION  1987  .  TUBAL LIGATION    . TUMOR REMOVAL Right    Hand  . tumor removed from right hand  2011  . ulnar nerve release Left 1996  . ULNAR NERVE REPAIR Left    Social History   Social History Narrative   ** Merged History Encounter **        Objective: Vital Signs: BP 133/83 (BP Location: Right Arm, Patient Position: Sitting, Cuff Size: Normal)   Pulse 100   Resp 14   Ht 5' 2.99" (1.6 m)   Wt 217 lb 3.2 oz (98.5 kg)   BMI 38.49 kg/m    Physical Exam  Constitutional: She is oriented to person, place, and time. She appears well-developed and well-nourished.  HENT:  Head: Normocephalic and atraumatic.  Eyes: Conjunctivae and EOM are normal.  Neck: Normal range of motion.  Cardiovascular: Normal rate, regular rhythm, normal heart sounds and intact distal pulses.  Pulmonary/Chest: Effort normal and breath sounds normal.  Abdominal: Soft. Bowel sounds are normal.  Lymphadenopathy:    She has no cervical adenopathy.  Neurological: She is alert and oriented to person, place, and time.  Skin: Skin is warm and dry.  Capillary refill takes less than 2 seconds.  Psychiatric: She has a normal mood and affect. Her behavior is normal.  Nursing note and vitals reviewed.    Musculoskeletal Exam: She has limited range of motion of cervical and lumbar spine due to fusion.  Shoulder joints elbow joints were in good range of motion.  She had bilateral first MCP and PIP thickening.  Mild DIP thickening was noted.  No synovitis was noted.  Hip joints are replaced but good range of motion.  Knee joints are replaced she has some warmth on palpation of her right knee joint.  She has bilateral pes cavus with dorsal spurring and hammertoes.  CDAI Exam: CDAI Score: Not documented Patient Global Assessment: Not documented; Provider Global Assessment: Not documented Swollen: Not documented; Tender: Not documented Joint Exam   Not documented   There is currently no information documented on the homunculus. Go to the Rheumatology activity and complete the homunculus joint exam.  Investigation: No additional findings.  Imaging: Xr Foot 2 Views Left  Result Date: 03/05/2018 PIP and DIP narrowing was noted.  No MTP changes or erosive changes were noted.  Dorsal spurring was noted.  A small calcaneal spur was noted. Impression: These findings are consistent with osteoarthritis of the foot.  Xr Foot 2 Views Right  Result Date: 03/05/2018 PIP and DIP narrowing was noted.  No MTP changes or erosive changes were noted.  Dorsal spurring was noted.  A small calcaneal spur was noted. Impression: These findings are consistent with osteoarthritis of the foot.  Xr Hand 2 View Left  Result Date: 03/05/2018 PIP and DIP narrowing was noted.  No MCP, intercarpal radiocarpal changes were noted.  No erosive changes were noted.  These findings are consistent with osteoarthritis of the hand.   Impression: These findings are consistent with osteoarthritis of the hand.  Xr Hand 2 View Right  Result Date: 03/05/2018 PIP and DIP narrowing  was noted.  No MCP, intercarpal radiocarpal changes were noted.  No erosive changes were noted.  These findings are consistent with osteoarthritis of the hand.   Impression: These findings are consistent with osteoarthritis of the hand.   Recent Labs: Lab Results  Component Value Date   WBC 11.8 (H) 09/25/2017   HGB 10.3 (L) 09/25/2017   PLT 229 09/25/2017   NA  135 09/24/2017   K 4.6 09/24/2017   CL 102 09/24/2017   CO2 26 09/24/2017   GLUCOSE 153 (H) 09/24/2017   BUN 10 09/24/2017   CREATININE 0.89 09/24/2017   CALCIUM 8.5 (L) 09/24/2017   GFRAA >60 09/24/2017    Speciality Comments: No specialty comments available.  Procedures:  No procedures performed Allergies: Morphine and related and Percocet [oxycodone-acetaminophen]   Assessment / Plan:     Visit Diagnoses: Primary osteoarthritis of both hands -the clinical findings are consistent with osteoarthritis.  She had no synovitis on examination.  All autoimmune work-up has been negative in the past.  She has been having discomfort in the left thumb.  Joint protection muscle strengthening was discussed.  I have also given her prescription of Voltaren gel that can be used topically.  Side effects were reviewed.  Natural anti-inflammatories were discussed.  Plan: XR Hand 2 View Right, XR Hand 2 View Left.  The x-rays were consistent with osteoarthritis of the hands.  History of total replacement of both hip joints-doing well she has good range of motion.  Status post bilateral knee replacements -she is not having much discomfort in her knee joints and had good results from the total knee replacement.  Primary osteoarthritis of both feet -she continues to have some discomfort in her feet.  She has severe arthritis in her feet.  Plan: XR Foot 2 Views Right, XR Foot 2 Views Left.  The x-rays were consistent with osteoarthritis of the feet.  DDD (degenerative disc disease), cervical - s/p fusion she has limited range of motion.  DDD  (degenerative disc disease), lumbar - s/p fusion she is off and on discomfort.  Fibromyalgia -she has long-standing history of fibromyalgia.  She is currently on Cymbalta, Neurontin, Robaxin.  She takes some of these medications on PRN basis.  Need for regular exercise and taking medications on a regular basis was encouraged.  History of peripheral neuropathy-related to diabetes mellitus.  History of diabetes mellitus  History of hyperlipidemia  Other insomnia  History of depression  Family history of rheumatoid arthritis - mother   Orders: Orders Placed This Encounter  Procedures  . XR Hand 2 View Right  . XR Hand 2 View Left  . XR Foot 2 Views Right  . XR Foot 2 Views Left   Meds ordered this encounter  Medications  . diclofenac sodium (VOLTAREN) 1 % GEL    Sig: Apply 3 gm to 3 large joints up to 3 times a day.Dispense 3 tubes with 3 refills.    Dispense:  3 Tube    Refill:  1    Face-to-face time spent with patient was 50 minutes. Greater than 50% of time was spent in counseling and coordination of care.  Follow-Up Instructions: Return if symptoms worsen or fail to improve, for OA, FMS.   Bo Merino, MD  Note - This record has been created using Editor, commissioning.  Chart creation errors have been sought, but may not always  have been located. Such creation errors do not reflect on  the standard of medical care.

## 2018-02-27 DIAGNOSIS — M1712 Unilateral primary osteoarthritis, left knee: Secondary | ICD-10-CM | POA: Diagnosis not present

## 2018-02-27 DIAGNOSIS — M2041 Other hammer toe(s) (acquired), right foot: Secondary | ICD-10-CM | POA: Diagnosis not present

## 2018-02-27 DIAGNOSIS — E781 Pure hyperglyceridemia: Secondary | ICD-10-CM | POA: Diagnosis not present

## 2018-02-27 DIAGNOSIS — Z96652 Presence of left artificial knee joint: Secondary | ICD-10-CM | POA: Diagnosis not present

## 2018-02-27 DIAGNOSIS — M353 Polymyalgia rheumatica: Secondary | ICD-10-CM | POA: Diagnosis not present

## 2018-02-27 DIAGNOSIS — E1142 Type 2 diabetes mellitus with diabetic polyneuropathy: Secondary | ICD-10-CM | POA: Diagnosis not present

## 2018-02-27 DIAGNOSIS — F418 Other specified anxiety disorders: Secondary | ICD-10-CM | POA: Diagnosis not present

## 2018-02-27 DIAGNOSIS — Z96651 Presence of right artificial knee joint: Secondary | ICD-10-CM | POA: Diagnosis not present

## 2018-02-27 DIAGNOSIS — Z23 Encounter for immunization: Secondary | ICD-10-CM | POA: Diagnosis not present

## 2018-02-27 DIAGNOSIS — M1711 Unilateral primary osteoarthritis, right knee: Secondary | ICD-10-CM | POA: Diagnosis not present

## 2018-03-05 ENCOUNTER — Encounter: Payer: Self-pay | Admitting: Rheumatology

## 2018-03-05 ENCOUNTER — Ambulatory Visit (INDEPENDENT_AMBULATORY_CARE_PROVIDER_SITE_OTHER): Payer: PPO

## 2018-03-05 ENCOUNTER — Ambulatory Visit (INDEPENDENT_AMBULATORY_CARE_PROVIDER_SITE_OTHER): Payer: Self-pay

## 2018-03-05 ENCOUNTER — Ambulatory Visit: Payer: PPO | Admitting: Rheumatology

## 2018-03-05 VITALS — BP 133/83 | HR 100 | Resp 14 | Ht 62.99 in | Wt 217.2 lb

## 2018-03-05 DIAGNOSIS — M19042 Primary osteoarthritis, left hand: Secondary | ICD-10-CM

## 2018-03-05 DIAGNOSIS — M19041 Primary osteoarthritis, right hand: Secondary | ICD-10-CM

## 2018-03-05 DIAGNOSIS — Z8659 Personal history of other mental and behavioral disorders: Secondary | ICD-10-CM | POA: Diagnosis not present

## 2018-03-05 DIAGNOSIS — Z8669 Personal history of other diseases of the nervous system and sense organs: Secondary | ICD-10-CM | POA: Diagnosis not present

## 2018-03-05 DIAGNOSIS — M5030 Other cervical disc degeneration, unspecified cervical region: Secondary | ICD-10-CM | POA: Diagnosis not present

## 2018-03-05 DIAGNOSIS — Z96653 Presence of artificial knee joint, bilateral: Secondary | ICD-10-CM | POA: Insufficient documentation

## 2018-03-05 DIAGNOSIS — M19072 Primary osteoarthritis, left ankle and foot: Secondary | ICD-10-CM

## 2018-03-05 DIAGNOSIS — M797 Fibromyalgia: Secondary | ICD-10-CM | POA: Insufficient documentation

## 2018-03-05 DIAGNOSIS — G4709 Other insomnia: Secondary | ICD-10-CM

## 2018-03-05 DIAGNOSIS — M19071 Primary osteoarthritis, right ankle and foot: Secondary | ICD-10-CM | POA: Diagnosis not present

## 2018-03-05 DIAGNOSIS — Z96643 Presence of artificial hip joint, bilateral: Secondary | ICD-10-CM | POA: Diagnosis not present

## 2018-03-05 DIAGNOSIS — Z8639 Personal history of other endocrine, nutritional and metabolic disease: Secondary | ICD-10-CM | POA: Diagnosis not present

## 2018-03-05 DIAGNOSIS — Z8261 Family history of arthritis: Secondary | ICD-10-CM | POA: Diagnosis not present

## 2018-03-05 DIAGNOSIS — M51369 Other intervertebral disc degeneration, lumbar region without mention of lumbar back pain or lower extremity pain: Secondary | ICD-10-CM | POA: Insufficient documentation

## 2018-03-05 DIAGNOSIS — M5136 Other intervertebral disc degeneration, lumbar region: Secondary | ICD-10-CM | POA: Diagnosis not present

## 2018-03-05 MED ORDER — DICLOFENAC SODIUM 1 % TD GEL: TRANSDERMAL | 1 refills | Status: DC

## 2018-03-05 NOTE — Patient Instructions (Signed)
Hand Exercises Hand exercises can be helpful to almost anyone. These exercises can strengthen the hands, improve flexibility and movement, and increase blood flow to the hands. These results can make work and daily tasks easier. Hand exercises can be especially helpful for people who have joint pain from arthritis or have nerve damage from overuse (carpal tunnel syndrome). These exercises can also help people who have injured a hand. Most of these hand exercises are fairly gentle stretching routines. You can do them often throughout the day. Still, it is a good idea to ask your health care provider which exercises would be best for you. Warming your hands before exercise may help to reduce stiffness. You can do this with gentle massage or by placing your hands in warm water for 15 minutes. Also, make sure you pay attention to your level of hand pain as you begin an exercise routine. Exercises Knuckle Bend Repeat this exercise 5-10 times with each hand. 1. Stand or sit with your arm, hand, and all five fingers pointed straight up. Make sure your wrist is straight. 2. Gently and slowly bend your fingers down and inward until the tips of your fingers are touching the tops of your palm. 3. Hold this position for a few seconds. 4. Extend your fingers out to their original position, all pointing straight up again.  Finger Fan Repeat this exercise 5-10 times with each hand. 1. Hold your arm and hand out in front of you. Keep your wrist straight. 2. Squeeze your hand into a fist. 3. Hold this position for a few seconds. 4. Edison Simon out, or spread apart, your hand and fingers as much as possible, stretching every joint fully.  Tabletop Repeat this exercise 5-10 times with each hand. 1. Stand or sit with your arm, hand, and all five fingers pointed straight up. Make sure your wrist is straight. 2. Gently and slowly bend your fingers at the knuckles where they meet the hand until your hand is making an  upside-down L shape. Your fingers should form a tabletop. 3. Hold this position for a few seconds. 4. Extend your fingers out to their original position, all pointing straight up again.  Making Os Repeat this exercise 5-10 times with each hand. 1. Stand or sit with your arm, hand, and all five fingers pointed straight up. Make sure your wrist is straight. 2. Make an O shape by touching your pointer finger to your thumb. Hold for a few seconds. Then open your hand wide. 3. Repeat this motion with each finger on your hand.  Table Spread Repeat this exercise 5-10 times with each hand. 1. Place your hand on a table with your palm facing down. Make sure your wrist is straight. 2. Spread your fingers out as much as possible. Hold this position for a few seconds. 3. Slide your fingers back together again. Hold for a few seconds.  Ball Grip  Repeat this exercise 10-15 times with each hand. 1. Hold a tennis ball or another soft ball in your hand. 2. While slowly increasing pressure, squeeze the ball as hard as possible. 3. Squeeze as hard as you can for 3-5 seconds. 4. Relax and repeat.  Wrist Curls Repeat this exercise 10-15 times with each hand. 1. Sit in a chair that has armrests. 2. Hold a light weight in your hand, such as a dumbbell that weighs 1-3 pounds (0.5-1.4 kg). Ask your health care provider what weight would be best for you. 3. Rest your hand  just over the end of the chair arm with your palm facing up. 4. Gently pivot your wrist up and down while holding the weight. Do not twist your wrist from side to side.  Contact a health care provider if:  Your hand pain or discomfort gets much worse when you do an exercise.  Your hand pain or discomfort does not improve within 2 hours after you exercise. If you have any of these problems, stop doing these exercises right away. Do not do them again unless your health care provider says that you can. Get help right away if:  You  develop sudden, severe hand pain. If this happens, stop doing these exercises right away. Do not do them again unless your health care provider says that you can. This information is not intended to replace advice given to you by your health care provider. Make sure you discuss any questions you have with your health care provider. Document Released: 06/06/2015 Document Revised: 12/01/2015 Document Reviewed: 01/03/2015 Elsevier Interactive Patient Education  2018 [employer]. Natural anti-inflammatories  You can purchase these at [employer], [employer] or online.  . Turmeric (capsules)  . Ginger (ginger root or capsules)  . Omega 3 (Fish, flax seeds, chia seeds, walnuts, almonds)  . Tart cherry (dried or extract)   Patient should be under the care of a physician while taking these supplements. This may not be reproduced without the permission of Dr. Bo Merino.

## 2018-03-06 ENCOUNTER — Encounter: Payer: Self-pay | Admitting: Rheumatology

## 2018-03-25 ENCOUNTER — Encounter: Payer: Self-pay | Admitting: Podiatry

## 2018-03-25 ENCOUNTER — Other Ambulatory Visit: Payer: Self-pay | Admitting: Podiatry

## 2018-03-25 ENCOUNTER — Ambulatory Visit (INDEPENDENT_AMBULATORY_CARE_PROVIDER_SITE_OTHER): Payer: PPO

## 2018-03-25 ENCOUNTER — Ambulatory Visit: Payer: PPO | Admitting: Podiatry

## 2018-03-25 VITALS — BP 137/69 | HR 87 | Resp 15 | Ht 63.0 in | Wt 201.0 lb

## 2018-03-25 DIAGNOSIS — M24574 Contracture, right foot: Secondary | ICD-10-CM

## 2018-03-25 DIAGNOSIS — M79674 Pain in right toe(s): Secondary | ICD-10-CM

## 2018-03-25 DIAGNOSIS — M797 Fibromyalgia: Secondary | ICD-10-CM

## 2018-03-25 DIAGNOSIS — R269 Unspecified abnormalities of gait and mobility: Secondary | ICD-10-CM

## 2018-03-25 DIAGNOSIS — M6240 Contracture of muscle, unspecified site: Secondary | ICD-10-CM | POA: Diagnosis not present

## 2018-03-25 DIAGNOSIS — Z6835 Body mass index (BMI) 35.0-35.9, adult: Secondary | ICD-10-CM

## 2018-03-25 DIAGNOSIS — M2041 Other hammer toe(s) (acquired), right foot: Secondary | ICD-10-CM

## 2018-03-25 DIAGNOSIS — G6289 Other specified polyneuropathies: Secondary | ICD-10-CM | POA: Diagnosis not present

## 2018-03-25 DIAGNOSIS — E042 Nontoxic multinodular goiter: Secondary | ICD-10-CM | POA: Insufficient documentation

## 2018-03-25 DIAGNOSIS — E1142 Type 2 diabetes mellitus with diabetic polyneuropathy: Secondary | ICD-10-CM

## 2018-03-25 NOTE — Progress Notes (Addendum)
Subjective:  Patient ID: Madison Stein, female    DOB: [birth year],  MRN: [id number]  Chief Complaint  Patient presents with  . Hammer Toe    R 2nd toe x years; 5-9/10 sharp pain -no injury Tx: toe splint    67 y.o. female presents with the above complaint.  Reports pain to the right second hammertoe for several years.  Given to the point where she cannot take the pain any longer.  Has tried a toe splint without relief.  Has pain with different shoes.  Pain is 5 out of 10 sharp in nature.  Denies injury.  Diabetic with last A1c of 6.4 and last a.m. blood sugar 120.  Endorses neuropathy to the right lower extremity worse than left due to her history of degenerative joint disease. Takes gabapentin and thinks it does help.  Review of Systems: Negative except as noted in the HPI. Denies N/V/F/Ch.  Past Medical History:  Diagnosis Date  . Arthritis   . Bronchitis 2013   Hx of  . Complication of anesthesia   . Depression    takes Cymbalta daily  . Depression   . Diabetes mellitus without complication ([place])    takes Metformin daily  . Diabetes mellitus without complication ([place])    Type II  . Family history of adverse reaction to anesthesia    patients mother and sister gets sick  . Family history of anesthesia complication    mom and sister get very sick  . Fibromyalgia   . History of bronchitis 80yrs ago  . History of shingles   . Hyperlipidemia    takes Fish Oil bid  . Hyperlipidemia   . Insomnia    has Ambien if needed  . Joint pain   . Peripheral neuropathy    takes Lyrica daily  . Pneumonia 22yrs ago   hx of  . Pneumonia    hx of  . PONV (postoperative nausea and vomiting)   . Thyroid nodule   . Urgency of urination    Leakage wears pad    Current Outpatient Medications:  .  acetaminophen (TYLENOL 8 HOUR ARTHRITIS PAIN) 650 MG CR tablet, Take 650-1,300 mg by mouth 2 (two) times daily as needed for pain., Disp: , Rfl:  .  aspirin EC 325 MG tablet, Take 1 tablet (325  mg total) by mouth 2 (two) times daily., Disp: 30 tablet, Rfl: 0 .  diclofenac sodium (VOLTAREN) 1 % GEL, Apply 3 gm to 3 large joints up to 3 times a day.Dispense 3 tubes with 3 refills., Disp: 3 Tube, Rfl: 1 .  DULoxetine (CYMBALTA) 60 MG capsule, Take 60 mg by mouth 2 (two) times daily., Disp: , Rfl:  .  fenofibrate 160 MG tablet, Take 160 mg by mouth daily., Disp: , Rfl: 0 .  gabapentin (NEURONTIN) 300 MG capsule, Take 300-600 mg by mouth 2 (two) times daily. Take 1 capsule (300 mg) in the morning & 2 capsules (600 mg) at night., Disp: , Rfl:  .  glimepiride (AMARYL) 4 MG tablet, Take 4 mg by mouth 2 (two) times daily., Disp: , Rfl:  .  HYDROmorphone (DILAUDID) 2 MG tablet, Take 1 tablet (2 mg total) by mouth every 4 (four) hours as needed for severe pain., Disp: 30 tablet, Rfl: 0 .  methocarbamol (ROBAXIN) 750 MG tablet, Take 1 tablet (750 mg total) by mouth 4 (four) times daily as needed for muscle spasms., Disp: 60 tablet, Rfl: 0 .  Multiple Vitamin (MULTIVITAMIN WITH MINERALS) TABS  tablet, Take 1 tablet by mouth daily., Disp: , Rfl:  .  SitaGLIPtin-MetFORMIN HCl (JANUMET XR) [phone] MG TB24, Take 1 tablet by mouth daily with supper., Disp: , Rfl:  .  VASCEPA 1 g CAPS, TK 2 CS PO BID WF, Disp: , Rfl: 2  Social History   Tobacco Use  Smoking Status Former Smoker  Smokeless Tobacco Never Used  Tobacco Comment   quit smoking in 1997    Allergies  Allergen Reactions  . Morphine And Related Nausea And Vomiting    "just don't tolerate it well"   Objective:   Vitals:   03/25/18 1153  BP: 137/69  Pulse: 87  Resp: 15   Body mass index is 35.61 kg/m. Constitutional Well developed. Well nourished.  Vascular Dorsalis pedis pulses palpable bilaterally. Posterior tibial pulses palpable bilaterally. Capillary refill normal to all digits.  No cyanosis or clubbing noted. Pedal hair growth normal.  Neurologic Normal speech. Oriented to person, place, and time. Epicritic sensation  to light touch grossly present bilaterally. Protective sensation measured with Thornell Mule monofilament absent right diminished left Vibratory sensation absent right does not left  Dermatologic Nails well groomed and normal in appearance. No open wounds. No skin lesions.  Orthopedic: Normal joint ROM without pain or crepitus bilaterally. Lesser digital contractures right foot including right second toe claw toe deformity. Left second extensor tendon contracture noted at the MPJ   Radiographs: X-rays taken reviewed hammertoe deformity left lesser digits.  No acute fractures dislocations Assessment:   1. Hammertoe of right foot   2. Pain in toe of right foot   3. Joint contracture of foot, right   4. Contracture, tendon   5. DM type 2 with diabetic peripheral neuropathy (HCC)   6. Other polyneuropathy   7. Fibromyalgia   8. BMI 35.0-35.9,adult    Plan:  Patient was evaluated and treated and all questions answered.  Hammertoe right second toe -X-rays taken and reviewed.  X-ray results reviewed with patient -Educated on etiology deformity. -Discussed conservative versus surgical therapy with patient. -Patient would benefit from surgical correction of the painful left second hammertoe deformity.  Discussed possible pin fixation.  As the toe is clogged and has deformity of the MPJ will likely benefit from tenotomy capsulotomy.  Would also consider flexor tendon transfer to assist in allowing the toe to lay flatter. -Patient has failed all conservative therapy and wishes to proceed with surgical intervention. All risks, benefits, and alternatives discussed with patient. No guarantees given. Consent reviewed and signed by patient. -Planned procedures: Right second hammertoe correction with possible pin fixation, possible flexor tenotomy, possible metatarsal phalangeal joint capsulotomy and tenotomy. -Risk factors: diabetes, peripheral neuropathy due to degenerative disc disease, obesity.   Discussed that though her diabetes is well controlled she is at higher risk of postoperative issues due to neuropathy.  Patient verbalized understanding. -CAM boot dispensed for postoperative use. Medically necessary for greater offloading and safe weightbearing post-operatively.  Return for Postoperatively.

## 2018-03-25 NOTE — Patient Instructions (Signed)
Pre-Operative Instructions  Congratulations, you have decided to take an important step towards improving your quality of life.  You can be assured that the doctors and staff at [facility] will be with you every step of the way.  Here are some important things you should know:  1. Plan to be at the surgery center/hospital at least 1 (one) hour prior to your scheduled time, unless otherwise directed by the surgical center/hospital staff.  You must have a responsible adult accompany you, remain during the surgery and drive you home.  Make sure you have directions to the surgical center/hospital to ensure you arrive on time. 2. If you are having surgery at [facility] or [facility], you will need a copy of your medical history and physical form from your family physician within one month prior to the date of surgery. We will give you a form for your primary physician to complete.  3. We make every effort to accommodate the date you request for surgery.  However, there are times where surgery dates or times have to be moved.  We will contact you as soon as possible if a change in schedule is required.   4. No aspirin/ibuprofen for one week before surgery.  If you are on aspirin, any non-steroidal anti-inflammatory medications (Mobic, Aleve, Ibuprofen) should not be taken seven (7) days prior to your surgery.  You make take Tylenol for pain prior to surgery.  5. Medications - If you are taking daily heart and blood pressure medications, seizure, reflux, allergy, asthma, anxiety, pain or diabetes medications, make sure you notify the surgery center/hospital before the day of surgery so they can tell you which medications you should take or avoid the day of surgery. 6. No food or drink after midnight the night before surgery unless directed otherwise by surgical center/hospital staff. 7. No alcoholic beverages 24-hours prior to surgery.  No smoking 24-hours prior or 24-hours after  surgery. 8. Wear loose pants or shorts. They should be loose enough to fit over bandages, boots, and casts. 9. Don't wear slip-on shoes. Sneakers are preferred. 10. Bring your boot with you to the surgery center/hospital.  Also bring crutches or a walker if your physician has prescribed it for you.  If you do not have this equipment, it will be provided for you after surgery. 11. If you have not been contacted by the surgery center/hospital by the day before your surgery, call to confirm the date and time of your surgery. 12. Leave-time from work may vary depending on the type of surgery you have.  Appropriate arrangements should be made prior to surgery with your employer. 13. Prescriptions will be provided immediately following surgery by your doctor.  Fill these as soon as possible after surgery and take the medication as directed. Pain medications will not be refilled on weekends and must be approved by the doctor. 14. Remove nail polish on the operative foot and avoid getting pedicures prior to surgery. 15. Wash the night before surgery.  The night before surgery wash the foot and leg well with water and the antibacterial soap provided. Be sure to pay special attention to beneath the toenails and in between the toes.  Wash for at least three (3) minutes. Rinse thoroughly with water and dry well with a towel.  Perform this wash unless told not to do so by your physician.  Enclosed: 1 Ice pack (please put in freezer the night before surgery)   1 Hibiclens skin cleaner     Pre-op instructions  If you have any questions regarding the instructions, please do not hesitate to call our office.  [place]: [address], [place], [place] [address] -- [phone]  [place]: [address] [place], [place] [address] -- [phone]  [employer]: [address]  [place], [place] [address] -- [phone]  [place]: [address], [address], [place],  [address] -- [phone]  Website:  [email or web address] 

## 2018-03-26 ENCOUNTER — Telehealth: Payer: Self-pay | Admitting: *Deleted

## 2018-03-26 NOTE — Telephone Encounter (Signed)
"  I saw Dr. March Rummage yesterday.  I was told to call you to schedule my surgery.  Dr. March Rummage had mentioned doing it next week."  I have you scheduled for surgery already on Wednesday, 04/02/2018.  All you need to do is register with the surgical center online.  Someone from the surgical center will call you a day or two prior to your surgery date and they will give you your arrival time.  "Okay, thank you so much."

## 2018-04-02 ENCOUNTER — Other Ambulatory Visit: Payer: Self-pay | Admitting: Podiatry

## 2018-04-02 ENCOUNTER — Encounter: Payer: Self-pay | Admitting: Podiatry

## 2018-04-02 DIAGNOSIS — E7800 Pure hypercholesterolemia, unspecified: Secondary | ICD-10-CM | POA: Diagnosis not present

## 2018-04-02 DIAGNOSIS — M24574 Contracture, right foot: Secondary | ICD-10-CM | POA: Diagnosis not present

## 2018-04-02 DIAGNOSIS — M2041 Other hammer toe(s) (acquired), right foot: Secondary | ICD-10-CM

## 2018-04-02 MED ORDER — CEPHALEXIN 500 MG PO CAPS
500.0000 mg | ORAL_CAPSULE | Freq: Two times a day (BID) | ORAL | 0 refills | Status: DC
Start: 2018-04-02 — End: 2018-05-12

## 2018-04-02 MED ORDER — ONDANSETRON HCL 4 MG PO TABS: 4.0000 mg | ORAL_TABLET | Freq: Three times a day (TID) | ORAL | 0 refills | Status: DC | PRN

## 2018-04-02 MED ORDER — OXYCODONE-ACETAMINOPHEN 10-325 MG PO TABS: 1.0000 | ORAL_TABLET | ORAL | 0 refills | Status: DC | PRN

## 2018-04-02 NOTE — Progress Notes (Signed)
DOS  04/02/2018  Correction of hammer toe, right second toe, with fixation and flexor  tendon transfer

## 2018-04-02 NOTE — Progress Notes (Signed)
Patient underwent outpatient surgery today at [facility] [place] [facility].  DOS: 04/02/18 Procedure: R 2nd Toe Hammertoe Deformity, MPJ Capsulotomy and Tenotomy

## 2018-04-02 NOTE — Progress Notes (Signed)
Rx sent to pharmacy for outpatient surgery. °

## 2018-04-03 ENCOUNTER — Ambulatory Visit: Payer: Self-pay | Admitting: Rheumatology

## 2018-04-04 ENCOUNTER — Telehealth: Payer: Self-pay | Admitting: Podiatry

## 2018-04-04 MED ORDER — OXYCODONE-ACETAMINOPHEN 10-325 MG PO TABS: 1.0000 | ORAL_TABLET | ORAL | 0 refills | Status: DC | PRN

## 2018-04-04 NOTE — Telephone Encounter (Signed)
I had surgery with Dr. March Rummage on Wednesday and the pain medication he prescribed is only going to last me until Sunday. I was wondering if he could go ahead and call in a refill to be on standby for me to pick up. My number is [phone]. Thank you.

## 2018-04-04 NOTE — Addendum Note (Signed)
Addended by: Hardie Pulley on: 04/04/2018 09:44 AM   Modules accepted: Orders

## 2018-04-04 NOTE — Telephone Encounter (Signed)
I called the pt and let her know that the prescription was sent in for her. I told her to call with any other questions or concerns she may have.

## 2018-04-08 ENCOUNTER — Ambulatory Visit (INDEPENDENT_AMBULATORY_CARE_PROVIDER_SITE_OTHER): Payer: PPO

## 2018-04-08 ENCOUNTER — Ambulatory Visit (INDEPENDENT_AMBULATORY_CARE_PROVIDER_SITE_OTHER): Payer: PPO | Admitting: Podiatry

## 2018-04-08 DIAGNOSIS — M79674 Pain in right toe(s): Secondary | ICD-10-CM | POA: Diagnosis not present

## 2018-04-08 DIAGNOSIS — Z9889 Other specified postprocedural states: Secondary | ICD-10-CM

## 2018-04-08 DIAGNOSIS — M2041 Other hammer toe(s) (acquired), right foot: Secondary | ICD-10-CM

## 2018-04-08 NOTE — Progress Notes (Signed)
Subjective:  Patient ID: Madison Stein, female    DOB: April 04, 1951,  MRN: [id number]  No chief complaint on file.  DOS: 04/02/18 Procedure: R 2nd Toe Hammertoe Deformity, MPJ Capsulotomy and Tenotomy  67 y.o. female returns for post-op check.  Says the toe is doing all right other than a little bit of pain.  States that without the pain medicine her pain is 7 out of 10 but with that it dropped about a 2 out of 10.  Has been icing elevating as directed.  States that when their bandage came off the patient rewrapped it.  Review of Systems: Negative except as noted in the HPI. Denies N/V/F/Ch.  Past Medical History:  Diagnosis Date  . Arthritis   . Bronchitis 2013   Hx of  . Complication of anesthesia   . Depression    takes Cymbalta daily  . Depression   . Diabetes mellitus without complication ([place])    takes Metformin daily  . Diabetes mellitus without complication ([place])    Type II  . Family history of adverse reaction to anesthesia    patients mother and sister gets sick  . Family history of anesthesia complication    mom and sister get very sick  . Fibromyalgia   . History of bronchitis 33yrs ago  . History of shingles   . Hyperlipidemia    takes Fish Oil bid  . Hyperlipidemia   . Insomnia    has Ambien if needed  . Joint pain   . Peripheral neuropathy    takes Lyrica daily  . Pneumonia 25yrs ago   hx of  . Pneumonia    hx of  . PONV (postoperative nausea and vomiting)   . Thyroid nodule   . Urgency of urination    Leakage wears pad    Current Outpatient Medications:  .  acetaminophen (TYLENOL 8 HOUR ARTHRITIS PAIN) 650 MG CR tablet, Take 650-1,300 mg by mouth 2 (two) times daily as needed for pain., Disp: , Rfl:  .  aspirin EC 325 MG tablet, Take 1 tablet (325 mg total) by mouth 2 (two) times daily., Disp: 30 tablet, Rfl: 0 .  cephALEXin (KEFLEX) 500 MG capsule, Take 1 capsule (500 mg total) by mouth 2 (two) times daily., Disp: 14 capsule, Rfl: 0 .  diclofenac  sodium (VOLTAREN) 1 % GEL, Apply 3 gm to 3 large joints up to 3 times a day.Dispense 3 tubes with 3 refills., Disp: 3 Tube, Rfl: 1 .  DULoxetine (CYMBALTA) 60 MG capsule, Take 60 mg by mouth 2 (two) times daily., Disp: , Rfl:  .  fenofibrate 160 MG tablet, Take 160 mg by mouth daily., Disp: , Rfl: 0 .  gabapentin (NEURONTIN) 300 MG capsule, Take 300-600 mg by mouth 2 (two) times daily. Take 1 capsule (300 mg) in the morning & 2 capsules (600 mg) at night., Disp: , Rfl:  .  glimepiride (AMARYL) 4 MG tablet, Take 4 mg by mouth 2 (two) times daily., Disp: , Rfl:  .  HYDROmorphone (DILAUDID) 2 MG tablet, Take 1 tablet (2 mg total) by mouth every 4 (four) hours as needed for severe pain., Disp: 30 tablet, Rfl: 0 .  methocarbamol (ROBAXIN) 750 MG tablet, Take 1 tablet (750 mg total) by mouth 4 (four) times daily as needed for muscle spasms., Disp: 60 tablet, Rfl: 0 .  Multiple Vitamin (MULTIVITAMIN WITH MINERALS) TABS tablet, Take 1 tablet by mouth daily., Disp: , Rfl:  .  ondansetron (ZOFRAN) 4 MG tablet,  Take 1 tablet (4 mg total) by mouth every 8 (eight) hours as needed for nausea or vomiting., Disp: 20 tablet, Rfl: 0 .  oxyCODONE-acetaminophen (PERCOCET) 10-325 MG tablet, Take 1 tablet by mouth every 4 (four) hours as needed for pain., Disp: 20 tablet, Rfl: 0 .  SitaGLIPtin-MetFORMIN HCl (JANUMET XR) [phone] MG TB24, Take 1 tablet by mouth daily with supper., Disp: , Rfl:  .  VASCEPA 1 g CAPS, TK 2 CS PO BID WF, Disp: , Rfl: 2  Social History   Tobacco Use  Smoking Status Former Smoker  Smokeless Tobacco Never Used  Tobacco Comment   quit smoking in 1997    Allergies  Allergen Reactions  . Morphine And Related Nausea And Vomiting    "just don't tolerate it well"   Objective:  There were no vitals filed for this visit. There is no height or weight on file to calculate BMI. Constitutional Well developed. Well nourished.  Vascular Foot warm and well perfused. Capillary refill normal to  all digits.   Neurologic Normal speech. Oriented to person, place, and time. Epicritic sensation to light touch grossly present bilaterally.  Dermatologic Skin healing well without signs of infection. Skin edges well coapted without signs of infection.  Orthopedic: Tenderness to palpation noted about the surgical site.   Radiographs: Taken reviewed.  Consistent with postop state.  Assessment:   1. Hammertoe of right foot   2. Pain in toe of right foot   3. Post-operative state    Plan:  Patient was evaluated and treated and all questions answered.  S/p foot surgery right -Progressing as expected post-operatively. -XR: As above. -WB Status: WBAT in CAM boot -Sutures: intact. -Medications: none refilled today. -Foot redressed. -Discussed with patient that it appears that the pin may have migrated a slight bit.  Advised to make sure that she is only weightbearing with the cam boot on.  Return in about 1 week (around 04/15/2018) for Post-op.

## 2018-04-14 ENCOUNTER — Encounter: Payer: Self-pay | Admitting: Podiatry

## 2018-04-14 ENCOUNTER — Ambulatory Visit (INDEPENDENT_AMBULATORY_CARE_PROVIDER_SITE_OTHER): Payer: PPO | Admitting: Podiatry

## 2018-04-14 VITALS — BP 130/74 | HR 86 | Temp 98.2°F | Resp 16

## 2018-04-14 DIAGNOSIS — M79674 Pain in right toe(s): Secondary | ICD-10-CM

## 2018-04-14 DIAGNOSIS — Z9889 Other specified postprocedural states: Secondary | ICD-10-CM

## 2018-04-14 DIAGNOSIS — M2041 Other hammer toe(s) (acquired), right foot: Secondary | ICD-10-CM

## 2018-04-14 NOTE — Progress Notes (Signed)
Subjective:  Patient ID: Madison Stein, female    DOB: 09-12-1950,  MRN: [id number]  Chief Complaint  Patient presents with  . Routine Post Op    Pt. stated, Once in a while I get this quick shooting pain on the top of my foot (5-6/10), otherwise it's fine." -pt denies N/V/F/Chg    DOS: 04/02/18 Procedure: R 2nd Toe Hammertoe Deformity, MPJ Capsulotomy and Tenotomy  67 y.o. female returns for post-op check. Pain is doing better only having occasional shooting pains.  Review of Systems: Negative except as noted in the HPI. Denies N/V/F/Ch.  Past Medical History:  Diagnosis Date  . Arthritis   . Bronchitis 2013   Hx of  . Complication of anesthesia   . Depression    takes Cymbalta daily  . Depression   . Diabetes mellitus without complication ([place])    takes Metformin daily  . Diabetes mellitus without complication ([place])    Type II  . Family history of adverse reaction to anesthesia    patients mother and sister gets sick  . Family history of anesthesia complication    mom and sister get very sick  . Fibromyalgia   . History of bronchitis 53yrs ago  . History of shingles   . Hyperlipidemia    takes Fish Oil bid  . Hyperlipidemia   . Insomnia    has Ambien if needed  . Joint pain   . Peripheral neuropathy    takes Lyrica daily  . Pneumonia 5yrs ago   hx of  . Pneumonia    hx of  . PONV (postoperative nausea and vomiting)   . Thyroid nodule   . Urgency of urination    Leakage wears pad    Current Outpatient Medications:  .  acetaminophen (TYLENOL 8 HOUR ARTHRITIS PAIN) 650 MG CR tablet, Take 650-1,300 mg by mouth 2 (two) times daily as needed for pain., Disp: , Rfl:  .  aspirin EC 325 MG tablet, Take 1 tablet (325 mg total) by mouth 2 (two) times daily., Disp: 30 tablet, Rfl: 0 .  cephALEXin (KEFLEX) 500 MG capsule, Take 1 capsule (500 mg total) by mouth 2 (two) times daily., Disp: 14 capsule, Rfl: 0 .  diclofenac sodium (VOLTAREN) 1 % GEL, Apply 3 gm to 3 large  joints up to 3 times a day.Dispense 3 tubes with 3 refills., Disp: 3 Tube, Rfl: 1 .  DULoxetine (CYMBALTA) 60 MG capsule, Take 60 mg by mouth 2 (two) times daily., Disp: , Rfl:  .  fenofibrate 160 MG tablet, Take 160 mg by mouth daily., Disp: , Rfl: 0 .  gabapentin (NEURONTIN) 300 MG capsule, Take 300-600 mg by mouth 2 (two) times daily. Take 1 capsule (300 mg) in the morning & 2 capsules (600 mg) at night., Disp: , Rfl:  .  glimepiride (AMARYL) 4 MG tablet, Take 4 mg by mouth 2 (two) times daily., Disp: , Rfl:  .  HYDROmorphone (DILAUDID) 2 MG tablet, Take 1 tablet (2 mg total) by mouth every 4 (four) hours as needed for severe pain., Disp: 30 tablet, Rfl: 0 .  methocarbamol (ROBAXIN) 750 MG tablet, Take 1 tablet (750 mg total) by mouth 4 (four) times daily as needed for muscle spasms., Disp: 60 tablet, Rfl: 0 .  Multiple Vitamin (MULTIVITAMIN WITH MINERALS) TABS tablet, Take 1 tablet by mouth daily., Disp: , Rfl:  .  ondansetron (ZOFRAN) 4 MG tablet, Take 1 tablet (4 mg total) by mouth every 8 (eight) hours as needed  for nausea or vomiting., Disp: 20 tablet, Rfl: 0 .  oxyCODONE-acetaminophen (PERCOCET) 10-325 MG tablet, Take 1 tablet by mouth every 4 (four) hours as needed for pain., Disp: 20 tablet, Rfl: 0 .  SitaGLIPtin-MetFORMIN HCl (JANUMET XR) [phone] MG TB24, Take 1 tablet by mouth daily with supper., Disp: , Rfl:  .  VASCEPA 1 g CAPS, TK 2 CS PO BID WF, Disp: , Rfl: 2  Social History   Tobacco Use  Smoking Status Former Smoker  Smokeless Tobacco Never Used  Tobacco Comment   quit smoking in 1997    Allergies  Allergen Reactions  . Morphine And Related Nausea And Vomiting    "just don't tolerate it well"   Objective:   Vitals:   04/14/18 0932  BP: 130/74  Pulse: 86  Resp: 16  Temp: 98.2 F (36.8 C)   There is no height or weight on file to calculate BMI. Constitutional Well developed. Well nourished.  Vascular Foot warm and well perfused. Capillary refill normal to  all digits.   Neurologic Normal speech. Oriented to person, place, and time. Epicritic sensation to light touch grossly present bilaterally.  Dermatologic Skin healing well without signs of infection. Skin edges well coapted without signs of infection.  Orthopedic: Tenderness to palpation noted about the surgical site.   Radiographs: None today. Assessment:   1. Hammertoe of right foot   2. Pain in toe of right foot   3. Post-operative state    Plan:  Patient was evaluated and treated and all questions answered.  S/p foot surgery right -Progressing as expected post-operatively. -XR: As above. -WB Status: WBAT in CAM boot -Sutures: every other suture removed. -Medications: none refilled today. Advised to call for refill. -Foot redressed. -Again advised to make sure she is only weightbearing with the cam boot on to prevent further pin migration.  Return in about 2 weeks (around 04/28/2018) for Post-op.

## 2018-04-17 ENCOUNTER — Telehealth: Payer: Self-pay | Admitting: Podiatry

## 2018-04-17 MED ORDER — OXYCODONE-ACETAMINOPHEN 10-325 MG PO TABS: 1.0000 | ORAL_TABLET | ORAL | 0 refills | Status: DC | PRN

## 2018-04-17 NOTE — Telephone Encounter (Signed)
Rx sent to pharmacy   

## 2018-04-17 NOTE — Addendum Note (Signed)
Addended by: Hardie Pulley on: 04/17/2018 10:47 AM   Modules accepted: Orders

## 2018-04-17 NOTE — Telephone Encounter (Signed)
I need a refill on my oxycodone for my pain. My number is [phone]. Thank you.

## 2018-04-17 NOTE — Telephone Encounter (Signed)
Called pt to let her know her prescription has been sent to the pharmacy.

## 2018-04-28 ENCOUNTER — Ambulatory Visit (INDEPENDENT_AMBULATORY_CARE_PROVIDER_SITE_OTHER): Payer: PPO | Admitting: Podiatry

## 2018-04-28 ENCOUNTER — Encounter: Payer: Self-pay | Admitting: Podiatry

## 2018-04-28 VITALS — BP 144/77 | HR 87 | Temp 97.8°F | Resp 16

## 2018-04-28 DIAGNOSIS — M2041 Other hammer toe(s) (acquired), right foot: Secondary | ICD-10-CM | POA: Diagnosis not present

## 2018-04-28 DIAGNOSIS — M79674 Pain in right toe(s): Secondary | ICD-10-CM

## 2018-04-28 DIAGNOSIS — Z9889 Other specified postprocedural states: Secondary | ICD-10-CM

## 2018-04-28 MED ORDER — OXYCODONE-ACETAMINOPHEN 5-325 MG PO TABS: 1.0000 | ORAL_TABLET | ORAL | 0 refills | Status: DC | PRN

## 2018-04-28 NOTE — Progress Notes (Signed)
Subjective:  Patient ID: Madison Stein, female    DOB: June 01, 1951,  MRN: [id number]  Chief Complaint  Patient presents with  . Routine Post Op    Pt. stated," it's been fine, there's been couple days that it gets achy ( 4/10) otherwise it's dong fine." Tx: oxy and cam boot -pt denies N/v/F?Ch   DOS: 04/02/18 Procedure: R 2nd Toe Hammertoe Deformity, MPJ Capsulotomy and Tenotomy  67 y.o. female returns for post-op check. History as above.  Review of Systems: Negative except as noted in the HPI. Denies N/V/F/Ch.  Past Medical History:  Diagnosis Date  . Arthritis   . Bronchitis 2013   Hx of  . Complication of anesthesia   . Depression    takes Cymbalta daily  . Depression   . Diabetes mellitus without complication ([place])    takes Metformin daily  . Diabetes mellitus without complication ([place])    Type II  . Family history of adverse reaction to anesthesia    patients mother and sister gets sick  . Family history of anesthesia complication    mom and sister get very sick  . Fibromyalgia   . History of bronchitis 48yrs ago  . History of shingles   . Hyperlipidemia    takes Fish Oil bid  . Hyperlipidemia   . Insomnia    has Ambien if needed  . Joint pain   . Peripheral neuropathy    takes Lyrica daily  . Pneumonia 64yrs ago   hx of  . Pneumonia    hx of  . PONV (postoperative nausea and vomiting)   . Thyroid nodule   . Urgency of urination    Leakage wears pad    Current Outpatient Medications:  .  acetaminophen (TYLENOL 8 HOUR ARTHRITIS PAIN) 650 MG CR tablet, Take 650-1,300 mg by mouth 2 (two) times daily as needed for pain., Disp: , Rfl:  .  aspirin EC 325 MG tablet, Take 1 tablet (325 mg total) by mouth 2 (two) times daily., Disp: 30 tablet, Rfl: 0 .  cephALEXin (KEFLEX) 500 MG capsule, Take 1 capsule (500 mg total) by mouth 2 (two) times daily., Disp: 14 capsule, Rfl: 0 .  diclofenac sodium (VOLTAREN) 1 % GEL, Apply 3 gm to 3 large joints up to 3 times a  day.Dispense 3 tubes with 3 refills., Disp: 3 Tube, Rfl: 1 .  DULoxetine (CYMBALTA) 60 MG capsule, Take 60 mg by mouth 2 (two) times daily., Disp: , Rfl:  .  fenofibrate 160 MG tablet, Take 160 mg by mouth daily., Disp: , Rfl: 0 .  gabapentin (NEURONTIN) 300 MG capsule, Take 300-600 mg by mouth 2 (two) times daily. Take 1 capsule (300 mg) in the morning & 2 capsules (600 mg) at night., Disp: , Rfl:  .  glimepiride (AMARYL) 4 MG tablet, Take 4 mg by mouth 2 (two) times daily., Disp: , Rfl:  .  HYDROmorphone (DILAUDID) 2 MG tablet, Take 1 tablet (2 mg total) by mouth every 4 (four) hours as needed for severe pain., Disp: 30 tablet, Rfl: 0 .  methocarbamol (ROBAXIN) 750 MG tablet, Take 1 tablet (750 mg total) by mouth 4 (four) times daily as needed for muscle spasms., Disp: 60 tablet, Rfl: 0 .  Multiple Vitamin (MULTIVITAMIN WITH MINERALS) TABS tablet, Take 1 tablet by mouth daily., Disp: , Rfl:  .  ondansetron (ZOFRAN) 4 MG tablet, Take 1 tablet (4 mg total) by mouth every 8 (eight) hours as needed for nausea or vomiting., Disp:  20 tablet, Rfl: 0 .  oxyCODONE-acetaminophen (PERCOCET) 10-325 MG tablet, Take 1 tablet by mouth every 4 (four) hours as needed for pain., Disp: 20 tablet, Rfl: 0 .  SitaGLIPtin-MetFORMIN HCl (JANUMET XR) [phone] MG TB24, Take 1 tablet by mouth daily with supper., Disp: , Rfl:  .  VASCEPA 1 g CAPS, TK 2 CS PO BID WF, Disp: , Rfl: 2  Social History   Tobacco Use  Smoking Status Former Smoker  Smokeless Tobacco Never Used  Tobacco Comment   quit smoking in 1997    Allergies  Allergen Reactions  . Morphine And Related Nausea And Vomiting    "just don't tolerate it well"   Objective:   Vitals:   04/28/18 1108  BP: (!) 144/77  Pulse: 87  Resp: 16  Temp: 97.8 F (36.6 C)   There is no height or weight on file to calculate BMI. Constitutional Well developed. Well nourished.  Vascular Foot warm and well perfused. Capillary refill normal to all digits.     Neurologic Normal speech. Oriented to person, place, and time. Epicritic sensation to light touch grossly present bilaterally.  Dermatologic Skin healing well without signs of infection. Skin edges well coapted without signs of infection.  Orthopedic: Tenderness to palpation noted about the surgical site.   Radiographs: None today. Assessment:   1. Hammertoe of right foot   2. Pain in toe of right foot   3. Post-operative state    Plan:  Patient was evaluated and treated and all questions answered.  S/p foot surgery right -Progressing as expected post-operatively. -XR: As above. -WB Status: WBAT in surgical shoe. Dispensed surgical shoe for safe off-loading  -Sutures: remaining sutures removed.. -Medications: pain med refilled. Lowered to 5/325 -Pin removed.  Return in about 2 weeks (around 05/12/2018) for post-op check.

## 2018-05-12 ENCOUNTER — Ambulatory Visit (INDEPENDENT_AMBULATORY_CARE_PROVIDER_SITE_OTHER): Payer: PPO

## 2018-05-12 ENCOUNTER — Ambulatory Visit (INDEPENDENT_AMBULATORY_CARE_PROVIDER_SITE_OTHER): Payer: PPO | Admitting: Podiatry

## 2018-05-12 DIAGNOSIS — M2041 Other hammer toe(s) (acquired), right foot: Secondary | ICD-10-CM | POA: Diagnosis not present

## 2018-05-12 DIAGNOSIS — Z9889 Other specified postprocedural states: Secondary | ICD-10-CM

## 2018-05-12 MED ORDER — OXYCODONE-ACETAMINOPHEN 5-325 MG PO TABS: 1.0000 | ORAL_TABLET | ORAL | 0 refills | Status: DC | PRN

## 2018-05-12 MED ORDER — CEPHALEXIN 500 MG PO CAPS: 500.0000 mg | ORAL_CAPSULE | Freq: Two times a day (BID) | ORAL | 0 refills | Status: DC

## 2018-05-12 NOTE — Progress Notes (Signed)
Subjective:  Patient ID: Madison Stein, female    DOB: [birth year],  MRN: [id number]  No chief complaint on file.  DOS: 04/02/18 Procedure: R 2nd Toe Hammertoe Deformity, MPJ Capsulotomy and Tenotomy  67 y.o. female returns for post-op check. Doing well only having occasional sharp pains.  Review of Systems: Negative except as noted in the HPI. Denies N/V/F/Ch.  Past Medical History:  Diagnosis Date  . Arthritis   . Bronchitis 2013   Hx of  . Complication of anesthesia   . Depression    takes Cymbalta daily  . Depression   . Diabetes mellitus without complication ([place])    takes Metformin daily  . Diabetes mellitus without complication ([place])    Type II  . Family history of adverse reaction to anesthesia    patients mother and sister gets sick  . Family history of anesthesia complication    mom and sister get very sick  . Fibromyalgia   . History of bronchitis 71yrs ago  . History of shingles   . Hyperlipidemia    takes Fish Oil bid  . Hyperlipidemia   . Insomnia    has Ambien if needed  . Joint pain   . Peripheral neuropathy    takes Lyrica daily  . Pneumonia 66yrs ago   hx of  . Pneumonia    hx of  . PONV (postoperative nausea and vomiting)   . Thyroid nodule   . Urgency of urination    Leakage wears pad    Current Outpatient Medications:  .  acetaminophen (TYLENOL 8 HOUR ARTHRITIS PAIN) 650 MG CR tablet, Take 650-1,300 mg by mouth 2 (two) times daily as needed for pain., Disp: , Rfl:  .  aspirin EC 325 MG tablet, Take 1 tablet (325 mg total) by mouth 2 (two) times daily., Disp: 30 tablet, Rfl: 0 .  cephALEXin (KEFLEX) 500 MG capsule, Take 1 capsule (500 mg total) by mouth 2 (two) times daily., Disp: 14 capsule, Rfl: 0 .  diclofenac sodium (VOLTAREN) 1 % GEL, Apply 3 gm to 3 large joints up to 3 times a day.Dispense 3 tubes with 3 refills., Disp: 3 Tube, Rfl: 1 .  DULoxetine (CYMBALTA) 60 MG capsule, Take 60 mg by mouth 2 (two) times daily., Disp: , Rfl:  .   fenofibrate 160 MG tablet, Take 160 mg by mouth daily., Disp: , Rfl: 0 .  gabapentin (NEURONTIN) 300 MG capsule, Take 300-600 mg by mouth 2 (two) times daily. Take 1 capsule (300 mg) in the morning & 2 capsules (600 mg) at night., Disp: , Rfl:  .  glimepiride (AMARYL) 4 MG tablet, Take 4 mg by mouth 2 (two) times daily., Disp: , Rfl:  .  HYDROmorphone (DILAUDID) 2 MG tablet, Take 1 tablet (2 mg total) by mouth every 4 (four) hours as needed for severe pain., Disp: 30 tablet, Rfl: 0 .  methocarbamol (ROBAXIN) 750 MG tablet, Take 1 tablet (750 mg total) by mouth 4 (four) times daily as needed for muscle spasms., Disp: 60 tablet, Rfl: 0 .  Multiple Vitamin (MULTIVITAMIN WITH MINERALS) TABS tablet, Take 1 tablet by mouth daily., Disp: , Rfl:  .  ondansetron (ZOFRAN) 4 MG tablet, Take 1 tablet (4 mg total) by mouth every 8 (eight) hours as needed for nausea or vomiting., Disp: 20 tablet, Rfl: 0 .  oxyCODONE-acetaminophen (PERCOCET/ROXICET) 5-325 MG tablet, Take 1 tablet by mouth every 4 (four) hours as needed for severe pain., Disp: 20 tablet, Rfl: 0 .  SitaGLIPtin-MetFORMIN  HCl (JANUMET XR) [id number] MG TB24, Take 1 tablet by mouth daily with supper., Disp: , Rfl:  .  VASCEPA 1 g CAPS, TK 2 CS PO BID WF, Disp: , Rfl: 2  Social History   Tobacco Use  Smoking Status Former Smoker  Smokeless Tobacco Never Used  Tobacco Comment   quit smoking in 1997    Allergies  Allergen Reactions  . Morphine And Related Nausea And Vomiting    "just don't tolerate it well"   Objective:   There were no vitals filed for this visit. There is no height or weight on file to calculate BMI. Constitutional Well developed. Well nourished.  Vascular Foot warm and well perfused. Capillary refill normal to all digits.   Neurologic Normal speech. Oriented to person, place, and time. Epicritic sensation to light touch grossly present bilaterally.  Dermatologic Skin healing well without signs of infection. Skin edges  well coapted without signs of infection.  Orthopedic: Tenderness to palpation noted about the surgical site.   Radiographs: taken and reviewed consolidation noted, slight plantarflexory deformity. Assessment:   1. Hammertoe of right foot   2. Post-operative state    Plan:  Patient was evaluated and treated and all questions answered.  S/p foot surgery right -XR: As above. Positioning not ideal. -WB Status: WBAT in surgical shoe.  -Medications: pain med refilled. Rx keflex. -Small dehiscence noted centrally, advised abx ointment daily.  Return in about 1 week (around 05/19/2018) for Post-op.

## 2018-05-19 ENCOUNTER — Encounter: Payer: Self-pay | Admitting: Podiatry

## 2018-05-19 ENCOUNTER — Ambulatory Visit (INDEPENDENT_AMBULATORY_CARE_PROVIDER_SITE_OTHER): Payer: PPO | Admitting: Podiatry

## 2018-05-19 VITALS — BP 135/77 | HR 84 | Temp 97.8°F | Resp 16

## 2018-05-19 DIAGNOSIS — M2041 Other hammer toe(s) (acquired), right foot: Secondary | ICD-10-CM

## 2018-05-19 DIAGNOSIS — Z9889 Other specified postprocedural states: Secondary | ICD-10-CM

## 2018-05-19 MED ORDER — HYDROCODONE-ACETAMINOPHEN 5-325 MG PO TABS: 1.0000 | ORAL_TABLET | Freq: Four times a day (QID) | ORAL | 0 refills | Status: DC | PRN

## 2018-05-19 NOTE — Progress Notes (Signed)
Subjective:  Patient ID: Madison Stein, female    DOB: [birth year],  MRN: [id number]  Chief Complaint  Patient presents with  . Routine Post Op    Pt. states," my toe still hurts and it's still red; 7/10 sharp pain when I walk." tx: surgical shoe, abx ointment, keflex, and oxycodone -pt denies N/V/F/Ch -w/ redness and swelling   DOS: 04/02/18 Procedure: R 2nd Toe Hammertoe Deformity, MPJ Capsulotomy and Tenotomy  67 y.o. female returns for post-op check. History as above.  Review of Systems: Negative except as noted in the HPI. Denies N/V/F/Ch.  Past Medical History:  Diagnosis Date  . Arthritis   . Bronchitis 2013   Hx of  . Complication of anesthesia   . Depression    takes Cymbalta daily  . Depression   . Diabetes mellitus without complication ([place])    takes Metformin daily  . Diabetes mellitus without complication ([place])    Type II  . Family history of adverse reaction to anesthesia    patients mother and sister gets sick  . Family history of anesthesia complication    mom and sister get very sick  . Fibromyalgia   . History of bronchitis 24yrs ago  . History of shingles   . Hyperlipidemia    takes Fish Oil bid  . Hyperlipidemia   . Insomnia    has Ambien if needed  . Joint pain   . Peripheral neuropathy    takes Lyrica daily  . Pneumonia 70yrs ago   hx of  . Pneumonia    hx of  . PONV (postoperative nausea and vomiting)   . Thyroid nodule   . Urgency of urination    Leakage wears pad    Current Outpatient Medications:  .  acetaminophen (TYLENOL 8 HOUR ARTHRITIS PAIN) 650 MG CR tablet, Take 650-1,300 mg by mouth 2 (two) times daily as needed for pain., Disp: , Rfl:  .  aspirin EC 325 MG tablet, Take 1 tablet (325 mg total) by mouth 2 (two) times daily., Disp: 30 tablet, Rfl: 0 .  diclofenac sodium (VOLTAREN) 1 % GEL, Apply 3 gm to 3 large joints up to 3 times a day.Dispense 3 tubes with 3 refills., Disp: 3 Tube, Rfl: 1 .  DULoxetine (CYMBALTA) 60 MG capsule,  Take 60 mg by mouth 2 (two) times daily., Disp: , Rfl:  .  fenofibrate 160 MG tablet, Take 160 mg by mouth daily., Disp: , Rfl: 0 .  gabapentin (NEURONTIN) 300 MG capsule, Take 300-600 mg by mouth 2 (two) times daily. Take 1 capsule (300 mg) in the morning & 2 capsules (600 mg) at night., Disp: , Rfl:  .  glimepiride (AMARYL) 4 MG tablet, Take 4 mg by mouth 2 (two) times daily., Disp: , Rfl:  .  methocarbamol (ROBAXIN) 750 MG tablet, Take 1 tablet (750 mg total) by mouth 4 (four) times daily as needed for muscle spasms., Disp: 60 tablet, Rfl: 0 .  Multiple Vitamin (MULTIVITAMIN WITH MINERALS) TABS tablet, Take 1 tablet by mouth daily., Disp: , Rfl:  .  ondansetron (ZOFRAN) 4 MG tablet, Take 1 tablet (4 mg total) by mouth every 8 (eight) hours as needed for nausea or vomiting., Disp: 20 tablet, Rfl: 0 .  SitaGLIPtin-MetFORMIN HCl (JANUMET XR) [phone] MG TB24, Take 1 tablet by mouth daily with supper., Disp: , Rfl:  .  VASCEPA 1 g CAPS, TK 2 CS PO BID WF, Disp: , Rfl: 2 .  HYDROcodone-acetaminophen (NORCO) 5-325 MG tablet, Take  1 tablet by mouth every 6 (six) hours as needed for moderate pain., Disp: 30 tablet, Rfl: 0  Social History   Tobacco Use  Smoking Status Former Smoker  Smokeless Tobacco Never Used  Tobacco Comment   quit smoking in 1997    Allergies  Allergen Reactions  . Morphine And Related Nausea And Vomiting    "just don't tolerate it well"   Objective:   Vitals:   05/19/18 1015  BP: 135/77  Pulse: 84  Resp: 16  Temp: 97.8 F (36.6 C)   There is no height or weight on file to calculate BMI. Constitutional Well developed. Well nourished.  Vascular Foot warm and well perfused. Capillary refill normal to all digits.   Neurologic Normal speech. Oriented to person, place, and time. Epicritic sensation to light touch grossly present bilaterally.  Dermatologic Skin well healed. Resolving erythema.  Orthopedic: Tenderness to palpation noted about the surgical site.    Radiographs: None today.  Assessment:   1. Hammertoe of right foot   2. Post-operative state    Plan:  Patient was evaluated and treated and all questions answered.  S/p foot surgery right -XR: None today. -WB Status: WBAT in surgical shoe.  -Medications: pain med refilled. Downgrade to norco -Wound healed. Residual swelling noted. Educated on daily wrapping with [place].  Return in about 2 weeks (around 06/02/2018) for Post-op.

## 2018-05-20 ENCOUNTER — Telehealth: Payer: Self-pay | Admitting: Podiatry

## 2018-05-20 NOTE — Telephone Encounter (Signed)
Patient had surgery 7 weeks ago and wants to know if she is able to take her boot off to drive.  Please call patient

## 2018-05-20 NOTE — Telephone Encounter (Signed)
I informed pt that as long as she was in a surgery shoe she could not drive, that was not just Dr. Eleanora Neighbor orders, but in [place].

## 2018-05-27 DIAGNOSIS — M25571 Pain in right ankle and joints of right foot: Secondary | ICD-10-CM | POA: Diagnosis not present

## 2018-05-30 DIAGNOSIS — M25571 Pain in right ankle and joints of right foot: Secondary | ICD-10-CM | POA: Diagnosis not present

## 2018-06-02 ENCOUNTER — Ambulatory Visit (INDEPENDENT_AMBULATORY_CARE_PROVIDER_SITE_OTHER): Payer: PPO | Admitting: Podiatry

## 2018-06-02 ENCOUNTER — Encounter: Payer: Self-pay | Admitting: Podiatry

## 2018-06-02 VITALS — BP 147/81 | HR 85 | Temp 97.0°F | Resp 16

## 2018-06-02 DIAGNOSIS — Z9889 Other specified postprocedural states: Secondary | ICD-10-CM

## 2018-06-02 DIAGNOSIS — E1142 Type 2 diabetes mellitus with diabetic polyneuropathy: Secondary | ICD-10-CM | POA: Diagnosis not present

## 2018-06-02 DIAGNOSIS — E781 Pure hyperglyceridemia: Secondary | ICD-10-CM | POA: Diagnosis not present

## 2018-06-02 DIAGNOSIS — Z23 Encounter for immunization: Secondary | ICD-10-CM | POA: Diagnosis not present

## 2018-06-02 DIAGNOSIS — F418 Other specified anxiety disorders: Secondary | ICD-10-CM | POA: Diagnosis not present

## 2018-06-02 DIAGNOSIS — M353 Polymyalgia rheumatica: Secondary | ICD-10-CM | POA: Diagnosis not present

## 2018-06-02 DIAGNOSIS — M2041 Other hammer toe(s) (acquired), right foot: Secondary | ICD-10-CM

## 2018-06-02 NOTE — Progress Notes (Signed)
Subjective:  Patient ID: Madison Stein, female    DOB: Jan 07, 1951,  MRN: [id number]  Chief Complaint  Patient presents with  . Routine Post Op    Pt. states," it's doing good, it still don't look attractive but it feel better. Just w/ occassional shooting pain (6/10) and at some areas are still tender." -pt denies N/V/F?CH -w/ less redness and swelling Tx: PT, hydrocodone, and wrapping w/ ace  -FBS: 137 x 1 day A1C: 6.4 PCP: Johnson x today   DOS: 04/02/18 Procedure: R 2nd Toe Hammertoe Deformity, MPJ Capsulotomy and Tenotomy  67 y.o. female returns for post-op check. History as above.  Review of Systems: Negative except as noted in the HPI. Denies N/V/F/Ch.  Past Medical History:  Diagnosis Date  . Arthritis   . Bronchitis 2013   Hx of  . Complication of anesthesia   . Depression    takes Cymbalta daily  . Depression   . Diabetes mellitus without complication ([place])    takes Metformin daily  . Diabetes mellitus without complication ([place])    Type II  . Family history of adverse reaction to anesthesia    patients mother and sister gets sick  . Family history of anesthesia complication    mom and sister get very sick  . Fibromyalgia   . History of bronchitis 42yrs ago  . History of shingles   . Hyperlipidemia    takes Fish Oil bid  . Hyperlipidemia   . Insomnia    has Ambien if needed  . Joint pain   . Peripheral neuropathy    takes Lyrica daily  . Pneumonia 42yrs ago   hx of  . Pneumonia    hx of  . PONV (postoperative nausea and vomiting)   . Thyroid nodule   . Urgency of urination    Leakage wears pad    Current Outpatient Medications:  .  acetaminophen (TYLENOL 8 HOUR ARTHRITIS PAIN) 650 MG CR tablet, Take 650-1,300 mg by mouth 2 (two) times daily as needed for pain., Disp: , Rfl:  .  aspirin EC 325 MG tablet, Take 1 tablet (325 mg total) by mouth 2 (two) times daily., Disp: 30 tablet, Rfl: 0 .  diclofenac sodium (VOLTAREN) 1 % GEL, Apply 3 gm to 3 large joints  up to 3 times a day.Dispense 3 tubes with 3 refills., Disp: 3 Tube, Rfl: 1 .  DULoxetine (CYMBALTA) 60 MG capsule, Take 60 mg by mouth 2 (two) times daily., Disp: , Rfl:  .  fenofibrate 160 MG tablet, Take 160 mg by mouth daily., Disp: , Rfl: 0 .  gabapentin (NEURONTIN) 300 MG capsule, Take 300-600 mg by mouth 2 (two) times daily. Take 1 capsule (300 mg) in the morning & 2 capsules (600 mg) at night., Disp: , Rfl:  .  glimepiride (AMARYL) 4 MG tablet, Take 4 mg by mouth 2 (two) times daily., Disp: , Rfl:  .  HYDROcodone-acetaminophen (NORCO) 5-325 MG tablet, Take 1 tablet by mouth every 6 (six) hours as needed for moderate pain., Disp: 30 tablet, Rfl: 0 .  methocarbamol (ROBAXIN) 750 MG tablet, Take 1 tablet (750 mg total) by mouth 4 (four) times daily as needed for muscle spasms., Disp: 60 tablet, Rfl: 0 .  Multiple Vitamin (MULTIVITAMIN WITH MINERALS) TABS tablet, Take 1 tablet by mouth daily., Disp: , Rfl:  .  ondansetron (ZOFRAN) 4 MG tablet, Take 1 tablet (4 mg total) by mouth every 8 (eight) hours as needed for nausea or vomiting., Disp:  20 tablet, Rfl: 0 .  SitaGLIPtin-MetFORMIN HCl (JANUMET XR) [phone] MG TB24, Take 1 tablet by mouth daily with supper., Disp: , Rfl:  .  VASCEPA 1 g CAPS, TK 2 CS PO BID WF, Disp: , Rfl: 2  Social History   Tobacco Use  Smoking Status Former Smoker  Smokeless Tobacco Never Used  Tobacco Comment   quit smoking in 1997    Allergies  Allergen Reactions  . Morphine And Related Nausea And Vomiting    "just don't tolerate it well"   Objective:   Vitals:   06/02/18 0925  BP: (!) 147/81  Pulse: 85  Resp: 16  Temp: (!) 97 F (36.1 C)   There is no height or weight on file to calculate BMI. Constitutional Well developed. Well nourished.  Vascular Foot warm and well perfused. Capillary refill normal to all digits.   Neurologic Normal speech. Oriented to person, place, and time. Epicritic sensation to light touch grossly present bilaterally.    Dermatologic Skin well healed.  Residual edema of the toe  Orthopedic: Tenderness to palpation noted about the surgical site.   Radiographs: None today.  Assessment:   1. Hammertoe of right foot   2. Post-operative state    Plan:  Patient was evaluated and treated and all questions answered.  S/p foot surgery right -XR: None today. -WB Status: WBAT in surgical shoe.  -Medications: None refilled today -Discussed possible revision with patient due to swelling and less than ideal positioning of the toe.  Patient wishes to defer at this time will discuss next visit  Return in about 6 months (around 12/01/2018) for Post-op.

## 2018-06-06 DIAGNOSIS — M25571 Pain in right ankle and joints of right foot: Secondary | ICD-10-CM | POA: Diagnosis not present

## 2018-06-11 DIAGNOSIS — M25571 Pain in right ankle and joints of right foot: Secondary | ICD-10-CM | POA: Diagnosis not present

## 2018-06-17 DIAGNOSIS — M25571 Pain in right ankle and joints of right foot: Secondary | ICD-10-CM | POA: Diagnosis not present

## 2018-06-24 ENCOUNTER — Ambulatory Visit (INDEPENDENT_AMBULATORY_CARE_PROVIDER_SITE_OTHER): Payer: PPO | Admitting: Podiatry

## 2018-06-24 ENCOUNTER — Encounter: Payer: Self-pay | Admitting: Podiatry

## 2018-06-24 VITALS — BP 133/73 | HR 85 | Temp 98.2°F | Resp 16

## 2018-06-24 DIAGNOSIS — Z9889 Other specified postprocedural states: Secondary | ICD-10-CM

## 2018-06-24 DIAGNOSIS — M25571 Pain in right ankle and joints of right foot: Secondary | ICD-10-CM | POA: Diagnosis not present

## 2018-06-24 DIAGNOSIS — M2041 Other hammer toe(s) (acquired), right foot: Secondary | ICD-10-CM

## 2018-06-24 NOTE — Progress Notes (Signed)
Subjective:  Patient ID: Madison Stein, female    DOB: 1951-07-02,  MRN: [id number]  Chief Complaint  Patient presents with  . Routine Post Op    Pt. states," it's still swollen, no pain but discomort, feels like somehting is pulling at the base of the toe and it still looks like it wants to turn right." Tx: -FBS: 1396 A1C: 7.1 -pt denies N/V?F/Cj Tx: PT   DOS: 04/02/18 Procedure: R 2nd Toe Hammertoe Deformity, MPJ Capsulotomy and Tenotomy  67 y.o. female returns for post-op check. History as above.  Review of Systems: Negative except as noted in the HPI. Denies N/V/F/Ch.  Past Medical History:  Diagnosis Date  . Arthritis   . Bronchitis 2013   Hx of  . Complication of anesthesia   . Depression    takes Cymbalta daily  . Depression   . Diabetes mellitus without complication ([place])    takes Metformin daily  . Diabetes mellitus without complication ([place])    Type II  . Family history of adverse reaction to anesthesia    patients mother and sister gets sick  . Family history of anesthesia complication    mom and sister get very sick  . Fibromyalgia   . History of bronchitis 34yrs ago  . History of shingles   . Hyperlipidemia    takes Fish Oil bid  . Hyperlipidemia   . Insomnia    has Ambien if needed  . Joint pain   . Peripheral neuropathy    takes Lyrica daily  . Pneumonia 48yrs ago   hx of  . Pneumonia    hx of  . PONV (postoperative nausea and vomiting)   . Thyroid nodule   . Urgency of urination    Leakage wears pad    Current Outpatient Medications:  .  acetaminophen (TYLENOL 8 HOUR ARTHRITIS PAIN) 650 MG CR tablet, Take 650-1,300 mg by mouth 2 (two) times daily as needed for pain., Disp: , Rfl:  .  aspirin EC 325 MG tablet, Take 1 tablet (325 mg total) by mouth 2 (two) times daily., Disp: 30 tablet, Rfl: 0 .  diclofenac sodium (VOLTAREN) 1 % GEL, Apply 3 gm to 3 large joints up to 3 times a day.Dispense 3 tubes with 3 refills., Disp: 3 Tube, Rfl: 1 .   DULoxetine (CYMBALTA) 60 MG capsule, Take 60 mg by mouth 2 (two) times daily., Disp: , Rfl:  .  fenofibrate 160 MG tablet, Take 160 mg by mouth daily., Disp: , Rfl: 0 .  gabapentin (NEURONTIN) 300 MG capsule, Take 300-600 mg by mouth 2 (two) times daily. Take 1 capsule (300 mg) in the morning & 2 capsules (600 mg) at night., Disp: , Rfl:  .  glimepiride (AMARYL) 4 MG tablet, Take 4 mg by mouth 2 (two) times daily., Disp: , Rfl:  .  HYDROcodone-acetaminophen (NORCO) 5-325 MG tablet, Take 1 tablet by mouth every 6 (six) hours as needed for moderate pain., Disp: 30 tablet, Rfl: 0 .  methocarbamol (ROBAXIN) 750 MG tablet, Take 1 tablet (750 mg total) by mouth 4 (four) times daily as needed for muscle spasms., Disp: 60 tablet, Rfl: 0 .  Multiple Vitamin (MULTIVITAMIN WITH MINERALS) TABS tablet, Take 1 tablet by mouth daily., Disp: , Rfl:  .  ondansetron (ZOFRAN) 4 MG tablet, Take 1 tablet (4 mg total) by mouth every 8 (eight) hours as needed for nausea or vomiting., Disp: 20 tablet, Rfl: 0 .  SitaGLIPtin-MetFORMIN HCl (JANUMET XR) [id number] MG TB24, Take  1 tablet by mouth daily with supper., Disp: , Rfl:  .  VASCEPA 1 g CAPS, TK 2 CS PO BID WF, Disp: , Rfl: 2  Social History   Tobacco Use  Smoking Status Former Smoker  Smokeless Tobacco Never Used  Tobacco Comment   quit smoking in 1997    Allergies  Allergen Reactions  . Morphine And Related Nausea And Vomiting    "just don't tolerate it well"   Objective:   Vitals:   06/24/18 1031  BP: 133/73  Pulse: 85  Resp: 16  Temp: 98.2 F (36.8 C)   There is no height or weight on file to calculate BMI. Constitutional Well developed. Well nourished.  Vascular Foot warm and well perfused. Capillary refill normal to all digits.   Neurologic Normal speech. Oriented to person, place, and time. Epicritic sensation to light touch grossly present bilaterally.  Dermatologic Skin well healed.  Residual edema of the toe  Orthopedic: Tenderness to  palpation noted about the surgical site.   Radiographs: None today.  Assessment:   1. Hammertoe of right foot   2. Post-operative state    Plan:  Patient was evaluated and treated and all questions answered.  S/p foot surgery right -XR: None today. -WB Status: WBAT in normal shoe -Medications: None refilled today -Discussed plan for revision due to less than ideal alignment.  Patient amenable we will plan for revision with potential screw fixation  Return for post-op care.

## 2018-06-24 NOTE — Patient Instructions (Signed)
Pre-Operative Instructions  Congratulations, you have decided to take an important step towards improving your quality of life.  You can be assured that the doctors and staff at [facility] will be with you every step of the way.  Here are some important things you should know:  1. Plan to be at the surgery center/hospital at least 1 (one) hour prior to your scheduled time, unless otherwise directed by the surgical center/hospital staff.  You must have a responsible adult accompany you, remain during the surgery and drive you home.  Make sure you have directions to the surgical center/hospital to ensure you arrive on time. 2. If you are having surgery at [facility] or [facility], you will need a copy of your medical history and physical form from your family physician within one month prior to the date of surgery. We will give you a form for your primary physician to complete.  3. We make every effort to accommodate the date you request for surgery.  However, there are times where surgery dates or times have to be moved.  We will contact you as soon as possible if a change in schedule is required.   4. No aspirin/ibuprofen for one week before surgery.  If you are on aspirin, any non-steroidal anti-inflammatory medications (Mobic, Aleve, Ibuprofen) should not be taken seven (7) days prior to your surgery.  You make take Tylenol for pain prior to surgery.  5. Medications - If you are taking daily heart and blood pressure medications, seizure, reflux, allergy, asthma, anxiety, pain or diabetes medications, make sure you notify the surgery center/hospital before the day of surgery so they can tell you which medications you should take or avoid the day of surgery. 6. No food or drink after midnight the night before surgery unless directed otherwise by surgical center/hospital staff. 7. No alcoholic beverages 24-hours prior to surgery.  No smoking 24-hours prior or 24-hours after  surgery. 8. Wear loose pants or shorts. They should be loose enough to fit over bandages, boots, and casts. 9. Don't wear slip-on shoes. Sneakers are preferred. 10. Bring your boot with you to the surgery center/hospital.  Also bring crutches or a walker if your physician has prescribed it for you.  If you do not have this equipment, it will be provided for you after surgery. 11. If you have not been contacted by the surgery center/hospital by the day before your surgery, call to confirm the date and time of your surgery. 12. Leave-time from work may vary depending on the type of surgery you have.  Appropriate arrangements should be made prior to surgery with your employer. 13. Prescriptions will be provided immediately following surgery by your doctor.  Fill these as soon as possible after surgery and take the medication as directed. Pain medications will not be refilled on weekends and must be approved by the doctor. 14. Remove nail polish on the operative foot and avoid getting pedicures prior to surgery. 15. Wash the night before surgery.  The night before surgery wash the foot and leg well with water and the antibacterial soap provided. Be sure to pay special attention to beneath the toenails and in between the toes.  Wash for at least three (3) minutes. Rinse thoroughly with water and dry well with a towel.  Perform this wash unless told not to do so by your physician.  Enclosed: 1 Ice pack (please put in freezer the night before surgery)   1 Hibiclens skin cleaner     Pre-op instructions  If you have any questions regarding the instructions, please do not hesitate to call our office.  [place]: [address], [place], [place] [address] -- [phone]  [place]: [address] [place], [place] [address] -- [phone]  [place]: [address]  [place], [place] [address] -- [phone]  [place]: [address], [address], [place], [place] [address] -- [phone]  Website:  [email or web address] 

## 2018-06-25 ENCOUNTER — Telehealth: Payer: Self-pay | Admitting: *Deleted

## 2018-06-25 NOTE — Telephone Encounter (Signed)
"  I saw Dr. March Rummage yesterday.  He said for me to call and tell you to put me down for surgery on Wednesday, January 8."  I will put you down tentatively.  Will your surgery be done at [facility]?  "Yes, it will."  Have you signed the consent forms?  "Yes, I have."  I will put you down tentatively until I get your paperwork from the [facility] office staff.  Someone from the surgical center will give you a call a day or two prior to your surgery date and they will give you your arrival time.  "Okay, thank you."

## 2018-07-09 HISTORY — PX: CATARACT EXTRACTION: SUR2

## 2018-07-15 ENCOUNTER — Telehealth: Payer: Self-pay | Admitting: *Deleted

## 2018-07-15 NOTE — Telephone Encounter (Signed)
Postop appointments have been rescheduled. °

## 2018-07-15 NOTE — Telephone Encounter (Signed)
"  I'm scheduled to have surgery tomorrow.  I'm going to have to reschedule that.  The person that was going to bring me and stay with me has the flu.  I can't find anyone else to bring me."  Do you have a date in mind that you would like to reschedule to?"  I'd like to do it the first week of February."  Dr. March Rummage can do it on February 5.  "That date will be fine, thank you."  I changed the date from 07/16/18 to 08/13/18 via the surgical center's [employer].

## 2018-07-22 ENCOUNTER — Encounter: Payer: PPO | Admitting: Podiatry

## 2018-07-29 ENCOUNTER — Encounter: Payer: PPO | Admitting: Podiatry

## 2018-07-31 DIAGNOSIS — J018 Other acute sinusitis: Secondary | ICD-10-CM | POA: Diagnosis not present

## 2018-08-12 ENCOUNTER — Telehealth: Payer: Self-pay | Admitting: Podiatry

## 2018-08-12 ENCOUNTER — Telehealth: Payer: Self-pay | Admitting: *Deleted

## 2018-08-12 NOTE — Telephone Encounter (Signed)
I was wondering if I can come get a new boot for my surgery I'm having tomorrow morning. The boot I have from my previous surgery, some of the straps have come loose. Please call me back.

## 2018-08-12 NOTE — Telephone Encounter (Signed)
"  I'm scheduled to have surgery tomorrow with Dr. March Rummage.  I have a boot already but the straps have broken on it.  Is there any way I can get a new boot?"  You do not need a boot.  "I got a boot the last time I had surgery."  The procedures you are having do not require you to have a boot, he'll put you in a surgical shoe.  "Oh, okay, well let me ask you another question.  Am I going to the [place] or am I going to the [place] will be going to [facility] located at [place]. [employer].  "Okay, that's what I thought.  Thank you for your help."

## 2018-08-13 ENCOUNTER — Other Ambulatory Visit: Payer: Self-pay | Admitting: Podiatry

## 2018-08-13 DIAGNOSIS — M24574 Contracture, right foot: Secondary | ICD-10-CM | POA: Diagnosis not present

## 2018-08-13 DIAGNOSIS — M6240 Contracture of muscle, unspecified site: Secondary | ICD-10-CM | POA: Diagnosis not present

## 2018-08-13 DIAGNOSIS — M2041 Other hammer toe(s) (acquired), right foot: Secondary | ICD-10-CM | POA: Diagnosis not present

## 2018-08-13 DIAGNOSIS — E7800 Pure hypercholesterolemia, unspecified: Secondary | ICD-10-CM | POA: Diagnosis not present

## 2018-08-13 MED ORDER — ONDANSETRON HCL 4 MG PO TABS: 4.0000 mg | ORAL_TABLET | Freq: Three times a day (TID) | ORAL | 0 refills | Status: DC | PRN

## 2018-08-13 MED ORDER — OXYCODONE-ACETAMINOPHEN 10-325 MG PO TABS: 1.0000 | ORAL_TABLET | ORAL | 0 refills | Status: DC | PRN

## 2018-08-13 MED ORDER — CEPHALEXIN 500 MG PO CAPS: 500.0000 mg | ORAL_CAPSULE | Freq: Two times a day (BID) | ORAL | 0 refills | Status: DC

## 2018-08-13 NOTE — Progress Notes (Signed)
Rx sent to pharmacy for outpatient surgery. °

## 2018-08-14 NOTE — Telephone Encounter (Signed)
Spoke with patient she will receive a new boot at the surgery center after surgery

## 2018-08-16 ENCOUNTER — Other Ambulatory Visit: Payer: Self-pay | Admitting: Podiatry

## 2018-08-16 DIAGNOSIS — M2041 Other hammer toe(s) (acquired), right foot: Secondary | ICD-10-CM

## 2018-08-16 DIAGNOSIS — M6240 Contracture of muscle, unspecified site: Secondary | ICD-10-CM

## 2018-08-16 NOTE — Progress Notes (Signed)
Patient underwent outpatient surgery at [facility]  DOS: 08/13/2018 Procedure: Right 2nd Hammertoe Correction, 2nd MPJ Capsulotomy

## 2018-08-19 ENCOUNTER — Other Ambulatory Visit: Payer: Self-pay

## 2018-08-19 ENCOUNTER — Encounter: Payer: Self-pay | Admitting: Podiatry

## 2018-08-19 ENCOUNTER — Ambulatory Visit (INDEPENDENT_AMBULATORY_CARE_PROVIDER_SITE_OTHER): Payer: PPO

## 2018-08-19 ENCOUNTER — Ambulatory Visit (INDEPENDENT_AMBULATORY_CARE_PROVIDER_SITE_OTHER): Payer: PPO | Admitting: Podiatry

## 2018-08-19 VITALS — BP 163/87 | HR 98 | Temp 97.9°F | Resp 16

## 2018-08-19 DIAGNOSIS — M2041 Other hammer toe(s) (acquired), right foot: Secondary | ICD-10-CM | POA: Diagnosis not present

## 2018-08-19 DIAGNOSIS — M79674 Pain in right toe(s): Secondary | ICD-10-CM

## 2018-08-19 DIAGNOSIS — Z9889 Other specified postprocedural states: Secondary | ICD-10-CM

## 2018-08-19 MED ORDER — HYDROMORPHONE HCL 2 MG PO TABS: 2.0000 mg | ORAL_TABLET | ORAL | 0 refills | Status: DC | PRN

## 2018-08-19 NOTE — Progress Notes (Signed)
Subjective:  Patient ID: Madison Stein, female    DOB: 1951/05/23,  MRN: [id number]  Chief Complaint  Patient presents with  . Routine Post Op    Pt states<' it's doing okay, but at the tip of the 2nd toe is osre and it feels like somehting is pulling; 3/10 shooting pain." tx: post shoe, elevation, icing and oxycodone -pt deneis N/V/F/Ch -fBS: 141 A1C: 7    DOS: 08/12/2018 Procedure: 2nd Hammertoe Revision with screw fixation, tenotomy and capsulotomy  68 y.o. female returns for post-op check. Doing ok pain is worse this time requesting stronger pain rx.  Review of Systems: Negative except as noted in the HPI. Denies N/V/F/Ch.  Past Medical History:  Diagnosis Date  . Arthritis   . Bronchitis 2013   Hx of  . Complication of anesthesia   . Depression    takes Cymbalta daily  . Depression   . Diabetes mellitus without complication ([place])    takes Metformin daily  . Diabetes mellitus without complication ([place])    Type II  . Family history of adverse reaction to anesthesia    patients mother and sister gets sick  . Family history of anesthesia complication    mom and sister get very sick  . Fibromyalgia   . History of bronchitis 58yrs ago  . History of shingles   . Hyperlipidemia    takes Fish Oil bid  . Hyperlipidemia   . Insomnia    has Ambien if needed  . Joint pain   . Peripheral neuropathy    takes Lyrica daily  . Pneumonia 50yrs ago   hx of  . Pneumonia    hx of  . PONV (postoperative nausea and vomiting)   . Thyroid nodule   . Urgency of urination    Leakage wears pad    Current Outpatient Medications:  .  acetaminophen (TYLENOL 8 HOUR ARTHRITIS PAIN) 650 MG CR tablet, Take 650-1,300 mg by mouth 2 (two) times daily as needed for pain., Disp: , Rfl:  .  amoxicillin (AMOXIL) 875 MG tablet, TK 1 T PO BID FOR 10 DAYS, Disp: , Rfl:  .  aspirin EC 325 MG tablet, Take 1 tablet (325 mg total) by mouth 2 (two) times daily., Disp: 30 tablet, Rfl: 0 .  celecoxib  (CELEBREX) 200 MG capsule, TK 1 C PO QD, Disp: , Rfl:  .  cephALEXin (KEFLEX) 500 MG capsule, Take 1 capsule (500 mg total) by mouth 2 (two) times daily., Disp: 14 capsule, Rfl: 0 .  diclofenac sodium (VOLTAREN) 1 % GEL, Apply 3 gm to 3 large joints up to 3 times a day.Dispense 3 tubes with 3 refills., Disp: 3 Tube, Rfl: 1 .  DULoxetine (CYMBALTA) 60 MG capsule, Take 60 mg by mouth 2 (two) times daily., Disp: , Rfl:  .  fenofibrate 160 MG tablet, Take 160 mg by mouth daily., Disp: , Rfl: 0 .  gabapentin (NEURONTIN) 300 MG capsule, Take 300-600 mg by mouth 2 (two) times daily. Take 1 capsule (300 mg) in the morning & 2 capsules (600 mg) at night., Disp: , Rfl:  .  glimepiride (AMARYL) 4 MG tablet, Take 4 mg by mouth 2 (two) times daily., Disp: , Rfl:  .  HYDROcodone-acetaminophen (NORCO) 5-325 MG tablet, Take 1 tablet by mouth every 6 (six) hours as needed for moderate pain., Disp: 30 tablet, Rfl: 0 .  HYDROMET 5-1.5 MG/5ML syrup, TK 5 ML PO BID PRF COUGH, Disp: , Rfl:  .  LORazepam (ATIVAN)  0.5 MG tablet, TK 1 T PO QD, Disp: , Rfl:  .  methocarbamol (ROBAXIN) 750 MG tablet, Take 1 tablet (750 mg total) by mouth 4 (four) times daily as needed for muscle spasms., Disp: 60 tablet, Rfl: 0 .  Multiple Vitamin (MULTIVITAMIN WITH MINERALS) TABS tablet, Take 1 tablet by mouth daily., Disp: , Rfl:  .  ondansetron (ZOFRAN) 4 MG tablet, Take 1 tablet (4 mg total) by mouth every 8 (eight) hours as needed for nausea or vomiting., Disp: 20 tablet, Rfl: 0 .  oxyCODONE-acetaminophen (PERCOCET) 10-325 MG tablet, Take 1 tablet by mouth every 4 (four) hours as needed for pain., Disp: 20 tablet, Rfl: 0 .  SitaGLIPtin-MetFORMIN HCl (JANUMET XR) [id number] MG TB24, Take 1 tablet by mouth daily with supper., Disp: , Rfl:  .  VASCEPA 1 g CAPS, TK 2 CS PO BID WF, Disp: , Rfl: 2 .  HYDROmorphone (DILAUDID) 2 MG tablet, Take 1 tablet (2 mg total) by mouth every 4 (four) hours as needed for severe pain., Disp: 12 tablet, Rfl:  0  Social History   Tobacco Use  Smoking Status Former Smoker  Smokeless Tobacco Never Used  Tobacco Comment   quit smoking in 1997    Allergies  Allergen Reactions  . Morphine And Related Nausea And Vomiting    "just don't tolerate it well"   Objective:   Vitals:   08/19/18 1347  BP: (!) 163/87  Pulse: 98  Resp: 16  Temp: 97.9 F (36.6 C)   There is no height or weight on file to calculate BMI. Constitutional Well developed. Well nourished.  Vascular Foot warm and well perfused. Capillary refill normal to all digits.   Neurologic Normal speech. Oriented to person, place, and time. Epicritic sensation to light touch grossly present bilaterally.  Dermatologic Skin healing well without signs of infection. Skin edges well coapted without signs of infection.  Orthopedic: Tenderness to palpation noted about the surgical site.   Radiographs: Taken and reviewed good position of the toe noted with intact screw. Slight gap along the 2nd PIPJ Assessment:   1. Hammertoe of right foot   2. Pain in toe of right foot   3. Post-operative state    Plan:  Patient was evaluated and treated and all questions answered.  S/p foot surgery right -Progressing as expected post-operatively. -XR: As above -WB Status: WBAT in surgical shoe -Sutures: Intact. -Medications: Short course of dilaudid for pain. -Foot redressed.  No follow-ups on file.

## 2018-08-26 ENCOUNTER — Encounter: Payer: PPO | Admitting: Podiatry

## 2018-08-26 ENCOUNTER — Ambulatory Visit (INDEPENDENT_AMBULATORY_CARE_PROVIDER_SITE_OTHER): Payer: PPO

## 2018-08-26 ENCOUNTER — Ambulatory Visit (INDEPENDENT_AMBULATORY_CARE_PROVIDER_SITE_OTHER): Payer: PPO | Admitting: Podiatry

## 2018-08-26 ENCOUNTER — Encounter: Payer: Self-pay | Admitting: Podiatry

## 2018-08-26 VITALS — BP 166/90 | HR 103 | Resp 16

## 2018-08-26 DIAGNOSIS — M6240 Contracture of muscle, unspecified site: Secondary | ICD-10-CM

## 2018-08-26 DIAGNOSIS — M2041 Other hammer toe(s) (acquired), right foot: Secondary | ICD-10-CM | POA: Diagnosis not present

## 2018-08-26 DIAGNOSIS — Z9889 Other specified postprocedural states: Secondary | ICD-10-CM

## 2018-08-27 DIAGNOSIS — F418 Other specified anxiety disorders: Secondary | ICD-10-CM | POA: Diagnosis not present

## 2018-08-27 DIAGNOSIS — M353 Polymyalgia rheumatica: Secondary | ICD-10-CM | POA: Diagnosis not present

## 2018-09-08 NOTE — Progress Notes (Signed)
Subjective:  Patient ID: Madison Stein, female    DOB: [birth year],  MRN: [id number]  Chief Complaint  Patient presents with  . Routine Post Op    POV #2 Pt. sates," it's doing good, still have some pain at the tip of the toe; 4/10 itnermittent shooting pain." tx: sx shoe and tylenol -pt deneis N/V/F?Ch    DOS: 08/12/2018 Procedure: 2nd Hammertoe Revision with screw fixation, tenotomy and capsulotomy  68 y.o. female returns for post-op check.  History as above confirmed with patient  Review of Systems: Negative except as noted in the HPI. Denies N/V/F/Ch.  Past Medical History:  Diagnosis Date  . Arthritis   . Bronchitis 2013   Hx of  . Complication of anesthesia   . Depression    takes Cymbalta daily  . Depression   . Diabetes mellitus without complication ([place])    takes Metformin daily  . Diabetes mellitus without complication ([place])    Type II  . Family history of adverse reaction to anesthesia    patients mother and sister gets sick  . Family history of anesthesia complication    mom and sister get very sick  . Fibromyalgia   . History of bronchitis 1yrs ago  . History of shingles   . Hyperlipidemia    takes Fish Oil bid  . Hyperlipidemia   . Insomnia    has Ambien if needed  . Joint pain   . Peripheral neuropathy    takes Lyrica daily  . Pneumonia 82yrs ago   hx of  . Pneumonia    hx of  . PONV (postoperative nausea and vomiting)   . Thyroid nodule   . Urgency of urination    Leakage wears pad    Current Outpatient Medications:  .  acetaminophen (TYLENOL 8 HOUR ARTHRITIS PAIN) 650 MG CR tablet, Take 650-1,300 mg by mouth 2 (two) times daily as needed for pain., Disp: , Rfl:  .  amoxicillin (AMOXIL) 875 MG tablet, TK 1 T PO BID FOR 10 DAYS, Disp: , Rfl:  .  aspirin EC 325 MG tablet, Take 1 tablet (325 mg total) by mouth 2 (two) times daily., Disp: 30 tablet, Rfl: 0 .  celecoxib (CELEBREX) 200 MG capsule, TK 1 C PO QD, Disp: , Rfl:  .  cephALEXin (KEFLEX) 500  MG capsule, Take 1 capsule (500 mg total) by mouth 2 (two) times daily., Disp: 14 capsule, Rfl: 0 .  diclofenac sodium (VOLTAREN) 1 % GEL, Apply 3 gm to 3 large joints up to 3 times a day.Dispense 3 tubes with 3 refills., Disp: 3 Tube, Rfl: 1 .  DULoxetine (CYMBALTA) 60 MG capsule, Take 60 mg by mouth 2 (two) times daily., Disp: , Rfl:  .  fenofibrate 160 MG tablet, Take 160 mg by mouth daily., Disp: , Rfl: 0 .  gabapentin (NEURONTIN) 300 MG capsule, Take 300-600 mg by mouth 2 (two) times daily. Take 1 capsule (300 mg) in the morning & 2 capsules (600 mg) at night., Disp: , Rfl:  .  glimepiride (AMARYL) 4 MG tablet, Take 4 mg by mouth 2 (two) times daily., Disp: , Rfl:  .  HYDROcodone-acetaminophen (NORCO) 5-325 MG tablet, Take 1 tablet by mouth every 6 (six) hours as needed for moderate pain., Disp: 30 tablet, Rfl: 0 .  HYDROMET 5-1.5 MG/5ML syrup, TK 5 ML PO BID PRF COUGH, Disp: , Rfl:  .  HYDROmorphone (DILAUDID) 2 MG tablet, Take 1 tablet (2 mg total) by mouth every 4 (four)  hours as needed for severe pain., Disp: 12 tablet, Rfl: 0 .  LORazepam (ATIVAN) 0.5 MG tablet, TK 1 T PO QD, Disp: , Rfl:  .  methocarbamol (ROBAXIN) 750 MG tablet, Take 1 tablet (750 mg total) by mouth 4 (four) times daily as needed for muscle spasms., Disp: 60 tablet, Rfl: 0 .  Multiple Vitamin (MULTIVITAMIN WITH MINERALS) TABS tablet, Take 1 tablet by mouth daily., Disp: , Rfl:  .  ondansetron (ZOFRAN) 4 MG tablet, Take 1 tablet (4 mg total) by mouth every 8 (eight) hours as needed for nausea or vomiting., Disp: 20 tablet, Rfl: 0 .  oxyCODONE-acetaminophen (PERCOCET) 10-325 MG tablet, Take 1 tablet by mouth every 4 (four) hours as needed for pain., Disp: 20 tablet, Rfl: 0 .  SitaGLIPtin-MetFORMIN HCl (JANUMET XR) [phone] MG TB24, Take 1 tablet by mouth daily with supper., Disp: , Rfl:  .  VASCEPA 1 g CAPS, TK 2 CS PO BID WF, Disp: , Rfl: 2  Social History   Tobacco Use  Smoking Status Former Smoker  Smokeless Tobacco  Never Used  Tobacco Comment   quit smoking in 1997    Allergies  Allergen Reactions  . Morphine And Related Nausea And Vomiting    "just don't tolerate it well"   Objective:   Vitals:   08/26/18 1037  BP: (!) 166/90  Pulse: (!) 103  Resp: 16   There is no height or weight on file to calculate BMI. Constitutional Well developed. Well nourished.  Vascular Foot warm and well perfused. Capillary refill normal to all digits.   Neurologic Normal speech. Oriented to person, place, and time. Epicritic sensation to light touch grossly present bilaterally.  Dermatologic Skin healing well without signs of infection. Skin edges well coapted without signs of infection.  Orthopedic: Tenderness to palpation noted about the surgical site.   Radiographs:  Taken and reviewed consistent with postop state no evident osseous bridging Assessment:   1. Post-operative state   2. Hammertoe of right foot   3. Contracture, tendon    Plan:  Patient was evaluated and treated and all questions answered.  S/p foot surgery right -Progressing as expected post-operatively. -XR: As above -WB Status: WBAT in surgical shoe -Sutures: Intact. -Medications: None refilled -Foot redressed.  No follow-ups on file.

## 2018-09-09 ENCOUNTER — Other Ambulatory Visit: Payer: Self-pay

## 2018-09-09 ENCOUNTER — Ambulatory Visit (INDEPENDENT_AMBULATORY_CARE_PROVIDER_SITE_OTHER): Payer: PPO | Admitting: Podiatry

## 2018-09-09 ENCOUNTER — Encounter: Payer: Self-pay | Admitting: Podiatry

## 2018-09-09 VITALS — BP 141/79 | HR 95 | Temp 98.1°F | Resp 16

## 2018-09-09 DIAGNOSIS — M2041 Other hammer toe(s) (acquired), right foot: Secondary | ICD-10-CM | POA: Diagnosis not present

## 2018-09-09 NOTE — Progress Notes (Signed)
Subjective:  Patient ID: Madison Stein, female    DOB: 13-May-1951,  MRN: [id number]  Chief Complaint  Patient presents with  . Routine Post Op    ov#3 dos 02.05.2020 Hammertoe Repair 2nd Rt, Capsulotomy MPJ Release Joint 2nd Rt  PT.s tates," doing better, no pain at all, just at the base of the toe it feels like somehting has been done." Tx: sx shoe -pt deneis N/V/F/Ch -w/ light redness and swellingt    DOS: 08/12/2018 Procedure: 2nd Hammertoe Revision with screw fixation, tenotomy and capsulotomy  68 y.o. female returns for post-op check.  History as above confirmed with patient.   Review of Systems: Negative except as noted in the HPI. Denies N/V/F/Ch.  Past Medical History:  Diagnosis Date  . Arthritis   . Bronchitis 2013   Hx of  . Complication of anesthesia   . Depression    takes Cymbalta daily  . Depression   . Diabetes mellitus without complication ([place])    takes Metformin daily  . Diabetes mellitus without complication ([place])    Type II  . Family history of adverse reaction to anesthesia    patients mother and sister gets sick  . Family history of anesthesia complication    mom and sister get very sick  . Fibromyalgia   . History of bronchitis 58yrs ago  . History of shingles   . Hyperlipidemia    takes Fish Oil bid  . Hyperlipidemia   . Insomnia    has Ambien if needed  . Joint pain   . Peripheral neuropathy    takes Lyrica daily  . Pneumonia 75yrs ago   hx of  . Pneumonia    hx of  . PONV (postoperative nausea and vomiting)   . Thyroid nodule   . Urgency of urination    Leakage wears pad    Current Outpatient Medications:  .  acetaminophen (TYLENOL 8 HOUR ARTHRITIS PAIN) 650 MG CR tablet, Take 650-1,300 mg by mouth 2 (two) times daily as needed for pain., Disp: , Rfl:  .  amoxicillin (AMOXIL) 875 MG tablet, TK 1 T PO BID FOR 10 DAYS, Disp: , Rfl:  .  aspirin EC 325 MG tablet, Take 1 tablet (325 mg total) by mouth 2 (two) times daily., Disp: 30 tablet,  Rfl: 0 .  buPROPion (WELLBUTRIN XL) 150 MG 24 hr tablet, TK 1 T PO QD, Disp: , Rfl:  .  celecoxib (CELEBREX) 200 MG capsule, TK 1 C PO QD, Disp: , Rfl:  .  diclofenac sodium (VOLTAREN) 1 % GEL, Apply 3 gm to 3 large joints up to 3 times a day.Dispense 3 tubes with 3 refills., Disp: 3 Tube, Rfl: 1 .  DULoxetine (CYMBALTA) 60 MG capsule, Take 60 mg by mouth 2 (two) times daily., Disp: , Rfl:  .  fenofibrate 160 MG tablet, Take 160 mg by mouth daily., Disp: , Rfl: 0 .  gabapentin (NEURONTIN) 300 MG capsule, Take 300-600 mg by mouth 2 (two) times daily. Take 1 capsule (300 mg) in the morning & 2 capsules (600 mg) at night., Disp: , Rfl:  .  glimepiride (AMARYL) 4 MG tablet, Take 4 mg by mouth 2 (two) times daily., Disp: , Rfl:  .  HYDROcodone-acetaminophen (NORCO) 5-325 MG tablet, Take 1 tablet by mouth every 6 (six) hours as needed for moderate pain., Disp: 30 tablet, Rfl: 0 .  HYDROMET 5-1.5 MG/5ML syrup, TK 5 ML PO BID PRF COUGH, Disp: , Rfl:  .  HYDROmorphone (DILAUDID)  2 MG tablet, Take 1 tablet (2 mg total) by mouth every 4 (four) hours as needed for severe pain., Disp: 12 tablet, Rfl: 0 .  LORazepam (ATIVAN) 0.5 MG tablet, TK 1 T PO QD, Disp: , Rfl:  .  methocarbamol (ROBAXIN) 750 MG tablet, Take 1 tablet (750 mg total) by mouth 4 (four) times daily as needed for muscle spasms., Disp: 60 tablet, Rfl: 0 .  Multiple Vitamin (MULTIVITAMIN WITH MINERALS) TABS tablet, Take 1 tablet by mouth daily., Disp: , Rfl:  .  ondansetron (ZOFRAN) 4 MG tablet, Take 1 tablet (4 mg total) by mouth every 8 (eight) hours as needed for nausea or vomiting., Disp: 20 tablet, Rfl: 0 .  oxyCODONE-acetaminophen (PERCOCET) 10-325 MG tablet, Take 1 tablet by mouth every 4 (four) hours as needed for pain., Disp: 20 tablet, Rfl: 0 .  SitaGLIPtin-MetFORMIN HCl (JANUMET XR) [phone] MG TB24, Take 1 tablet by mouth daily with supper., Disp: , Rfl:  .  VASCEPA 1 g CAPS, TK 2 CS PO BID WF, Disp: , Rfl: 2  Social History    Tobacco Use  Smoking Status Former Smoker  Smokeless Tobacco Never Used  Tobacco Comment   quit smoking in 1997    Allergies  Allergen Reactions  . Morphine And Related Nausea And Vomiting    "just don't tolerate it well"   Objective:   Vitals:   09/09/18 1421  BP: (!) 141/79  Pulse: 95  Resp: 16  Temp: 98.1 F (36.7 C)   There is no height or weight on file to calculate BMI. Constitutional Well developed. Well nourished.  Vascular Foot warm and well perfused. Capillary refill normal to all digits.   Neurologic Normal speech. Oriented to person, place, and time. Epicritic sensation to light touch grossly present bilaterally.  Dermatologic Skin healed  Orthopedic: No tenderness to palpation noted about the surgical site. Slight MPJ scar tissue   Radiographs:  None today. Assessment:   1. Hammertoe of right foot    Plan:  Patient was evaluated and treated and all questions answered.  S/p foot surgery right -Progressing as expected post-operatively. -XR: As above -WB Status: WBAT in surgical shoe -Dispense toe alignment splint. Medically necessary to maintain surgical alignment of the digit and stretching of the MPJ. -Sutures: out. Skin healed. -Medications: None refilled  Return in about 2 weeks (around 09/23/2018) for XRs, Post-op. Transition to normal shoe at that time.

## 2018-09-15 DIAGNOSIS — E781 Pure hyperglyceridemia: Secondary | ICD-10-CM | POA: Diagnosis not present

## 2018-09-15 DIAGNOSIS — M353 Polymyalgia rheumatica: Secondary | ICD-10-CM | POA: Diagnosis not present

## 2018-09-15 DIAGNOSIS — F418 Other specified anxiety disorders: Secondary | ICD-10-CM | POA: Diagnosis not present

## 2018-09-15 DIAGNOSIS — E1142 Type 2 diabetes mellitus with diabetic polyneuropathy: Secondary | ICD-10-CM | POA: Diagnosis not present

## 2018-09-15 DIAGNOSIS — Z1231 Encounter for screening mammogram for malignant neoplasm of breast: Secondary | ICD-10-CM | POA: Diagnosis not present

## 2018-09-19 DIAGNOSIS — E1139 Type 2 diabetes mellitus with other diabetic ophthalmic complication: Secondary | ICD-10-CM | POA: Diagnosis not present

## 2018-09-23 ENCOUNTER — Encounter: Payer: PPO | Admitting: Podiatry

## 2018-09-23 DIAGNOSIS — R799 Abnormal finding of blood chemistry, unspecified: Secondary | ICD-10-CM | POA: Diagnosis not present

## 2018-09-23 DIAGNOSIS — E1139 Type 2 diabetes mellitus with other diabetic ophthalmic complication: Secondary | ICD-10-CM | POA: Diagnosis not present

## 2018-09-29 ENCOUNTER — Encounter: Payer: PPO | Admitting: Podiatry

## 2018-09-29 ENCOUNTER — Telehealth: Payer: Self-pay | Admitting: Podiatry

## 2018-09-29 ENCOUNTER — Other Ambulatory Visit: Payer: Self-pay

## 2018-09-29 NOTE — Telephone Encounter (Signed)
Called to check on patient.Post-op and missed today's appointment. No answer, VM left.

## 2018-10-01 DIAGNOSIS — Z1231 Encounter for screening mammogram for malignant neoplasm of breast: Secondary | ICD-10-CM | POA: Diagnosis not present

## 2018-10-01 DIAGNOSIS — Z6837 Body mass index (BMI) 37.0-37.9, adult: Secondary | ICD-10-CM | POA: Diagnosis not present

## 2018-10-01 DIAGNOSIS — Z0000 Encounter for general adult medical examination without abnormal findings: Secondary | ICD-10-CM | POA: Diagnosis not present

## 2018-10-02 DIAGNOSIS — R799 Abnormal finding of blood chemistry, unspecified: Secondary | ICD-10-CM | POA: Diagnosis not present

## 2018-12-25 DIAGNOSIS — E119 Type 2 diabetes mellitus without complications: Secondary | ICD-10-CM | POA: Diagnosis not present

## 2018-12-25 DIAGNOSIS — Z01818 Encounter for other preprocedural examination: Secondary | ICD-10-CM | POA: Diagnosis not present

## 2018-12-25 DIAGNOSIS — H25811 Combined forms of age-related cataract, right eye: Secondary | ICD-10-CM | POA: Diagnosis not present

## 2018-12-25 DIAGNOSIS — H52221 Regular astigmatism, right eye: Secondary | ICD-10-CM | POA: Diagnosis not present

## 2018-12-31 DIAGNOSIS — M353 Polymyalgia rheumatica: Secondary | ICD-10-CM | POA: Diagnosis not present

## 2018-12-31 DIAGNOSIS — E781 Pure hyperglyceridemia: Secondary | ICD-10-CM | POA: Diagnosis not present

## 2018-12-31 DIAGNOSIS — E1142 Type 2 diabetes mellitus with diabetic polyneuropathy: Secondary | ICD-10-CM | POA: Diagnosis not present

## 2018-12-31 DIAGNOSIS — F418 Other specified anxiety disorders: Secondary | ICD-10-CM | POA: Diagnosis not present

## 2019-01-07 DIAGNOSIS — H25819 Combined forms of age-related cataract, unspecified eye: Secondary | ICD-10-CM | POA: Diagnosis not present

## 2019-01-07 DIAGNOSIS — H52201 Unspecified astigmatism, right eye: Secondary | ICD-10-CM | POA: Diagnosis not present

## 2019-01-07 DIAGNOSIS — H2511 Age-related nuclear cataract, right eye: Secondary | ICD-10-CM | POA: Diagnosis not present

## 2019-01-07 DIAGNOSIS — H25811 Combined forms of age-related cataract, right eye: Secondary | ICD-10-CM | POA: Diagnosis not present

## 2019-01-14 DIAGNOSIS — H2512 Age-related nuclear cataract, left eye: Secondary | ICD-10-CM | POA: Diagnosis not present

## 2019-01-14 DIAGNOSIS — H52202 Unspecified astigmatism, left eye: Secondary | ICD-10-CM | POA: Diagnosis not present

## 2019-01-14 DIAGNOSIS — H25811 Combined forms of age-related cataract, right eye: Secondary | ICD-10-CM | POA: Diagnosis not present

## 2019-01-14 DIAGNOSIS — H25812 Combined forms of age-related cataract, left eye: Secondary | ICD-10-CM | POA: Diagnosis not present

## 2019-02-13 DIAGNOSIS — M545 Low back pain: Secondary | ICD-10-CM | POA: Diagnosis not present

## 2019-02-18 DIAGNOSIS — M545 Low back pain: Secondary | ICD-10-CM | POA: Diagnosis not present

## 2019-02-26 DIAGNOSIS — M5416 Radiculopathy, lumbar region: Secondary | ICD-10-CM | POA: Diagnosis not present

## 2019-02-26 DIAGNOSIS — M545 Low back pain: Secondary | ICD-10-CM | POA: Diagnosis not present

## 2019-03-06 DIAGNOSIS — H16101 Unspecified superficial keratitis, right eye: Secondary | ICD-10-CM | POA: Diagnosis not present

## 2019-03-06 DIAGNOSIS — Z961 Presence of intraocular lens: Secondary | ICD-10-CM | POA: Diagnosis not present

## 2019-03-10 DIAGNOSIS — Z961 Presence of intraocular lens: Secondary | ICD-10-CM | POA: Diagnosis not present

## 2019-03-10 DIAGNOSIS — H16101 Unspecified superficial keratitis, right eye: Secondary | ICD-10-CM | POA: Diagnosis not present

## 2019-03-17 DIAGNOSIS — M5416 Radiculopathy, lumbar region: Secondary | ICD-10-CM | POA: Diagnosis not present

## 2019-03-30 DIAGNOSIS — M5416 Radiculopathy, lumbar region: Secondary | ICD-10-CM | POA: Diagnosis not present

## 2019-04-08 DIAGNOSIS — F418 Other specified anxiety disorders: Secondary | ICD-10-CM | POA: Diagnosis not present

## 2019-04-08 DIAGNOSIS — M353 Polymyalgia rheumatica: Secondary | ICD-10-CM | POA: Diagnosis not present

## 2019-04-08 DIAGNOSIS — E1142 Type 2 diabetes mellitus with diabetic polyneuropathy: Secondary | ICD-10-CM | POA: Diagnosis not present

## 2019-04-08 DIAGNOSIS — Z23 Encounter for immunization: Secondary | ICD-10-CM | POA: Diagnosis not present

## 2019-04-08 DIAGNOSIS — E781 Pure hyperglyceridemia: Secondary | ICD-10-CM | POA: Diagnosis not present

## 2019-04-14 DIAGNOSIS — Z1231 Encounter for screening mammogram for malignant neoplasm of breast: Secondary | ICD-10-CM | POA: Diagnosis not present

## 2019-04-14 DIAGNOSIS — R799 Abnormal finding of blood chemistry, unspecified: Secondary | ICD-10-CM | POA: Diagnosis not present

## 2019-04-14 LAB — HM MAMMOGRAPHY

## 2019-04-28 DIAGNOSIS — M5416 Radiculopathy, lumbar region: Secondary | ICD-10-CM | POA: Diagnosis not present

## 2019-05-14 DIAGNOSIS — Z20828 Contact with and (suspected) exposure to other viral communicable diseases: Secondary | ICD-10-CM | POA: Diagnosis not present

## 2019-05-14 DIAGNOSIS — J00 Acute nasopharyngitis [common cold]: Secondary | ICD-10-CM | POA: Diagnosis not present

## 2019-05-26 DIAGNOSIS — J208 Acute bronchitis due to other specified organisms: Secondary | ICD-10-CM | POA: Diagnosis not present

## 2019-06-11 DIAGNOSIS — R05 Cough: Secondary | ICD-10-CM | POA: Diagnosis not present

## 2019-06-11 DIAGNOSIS — J309 Allergic rhinitis, unspecified: Secondary | ICD-10-CM | POA: Diagnosis not present

## 2019-06-11 DIAGNOSIS — B370 Candidal stomatitis: Secondary | ICD-10-CM | POA: Diagnosis not present

## 2019-06-22 DIAGNOSIS — M47816 Spondylosis without myelopathy or radiculopathy, lumbar region: Secondary | ICD-10-CM | POA: Insufficient documentation

## 2019-06-22 DIAGNOSIS — M545 Low back pain: Secondary | ICD-10-CM | POA: Diagnosis not present

## 2019-06-22 DIAGNOSIS — R251 Tremor, unspecified: Secondary | ICD-10-CM | POA: Insufficient documentation

## 2019-06-22 DIAGNOSIS — M5136 Other intervertebral disc degeneration, lumbar region: Secondary | ICD-10-CM | POA: Diagnosis not present

## 2019-06-22 DIAGNOSIS — M5412 Radiculopathy, cervical region: Secondary | ICD-10-CM | POA: Insufficient documentation

## 2019-06-22 DIAGNOSIS — M5416 Radiculopathy, lumbar region: Secondary | ICD-10-CM | POA: Diagnosis not present

## 2019-06-22 DIAGNOSIS — M542 Cervicalgia: Secondary | ICD-10-CM | POA: Diagnosis not present

## 2019-06-22 DIAGNOSIS — M5126 Other intervertebral disc displacement, lumbar region: Secondary | ICD-10-CM | POA: Diagnosis not present

## 2019-06-26 DIAGNOSIS — M5412 Radiculopathy, cervical region: Secondary | ICD-10-CM | POA: Diagnosis not present

## 2019-06-26 DIAGNOSIS — M4802 Spinal stenosis, cervical region: Secondary | ICD-10-CM | POA: Diagnosis not present

## 2019-07-13 DIAGNOSIS — M4802 Spinal stenosis, cervical region: Secondary | ICD-10-CM | POA: Insufficient documentation

## 2019-07-13 DIAGNOSIS — G939 Disorder of brain, unspecified: Secondary | ICD-10-CM | POA: Insufficient documentation

## 2019-07-13 DIAGNOSIS — M4804 Spinal stenosis, thoracic region: Secondary | ICD-10-CM | POA: Diagnosis not present

## 2019-07-13 DIAGNOSIS — R030 Elevated blood-pressure reading, without diagnosis of hypertension: Secondary | ICD-10-CM | POA: Diagnosis not present

## 2019-07-23 DIAGNOSIS — M4804 Spinal stenosis, thoracic region: Secondary | ICD-10-CM | POA: Diagnosis not present

## 2019-07-23 DIAGNOSIS — M546 Pain in thoracic spine: Secondary | ICD-10-CM | POA: Diagnosis not present

## 2019-08-03 DIAGNOSIS — G939 Disorder of brain, unspecified: Secondary | ICD-10-CM | POA: Diagnosis not present

## 2019-08-03 DIAGNOSIS — M4804 Spinal stenosis, thoracic region: Secondary | ICD-10-CM | POA: Diagnosis not present

## 2019-08-03 DIAGNOSIS — G959 Disease of spinal cord, unspecified: Secondary | ICD-10-CM | POA: Diagnosis not present

## 2019-08-03 DIAGNOSIS — R030 Elevated blood-pressure reading, without diagnosis of hypertension: Secondary | ICD-10-CM | POA: Diagnosis not present

## 2019-08-07 DIAGNOSIS — E782 Mixed hyperlipidemia: Secondary | ICD-10-CM | POA: Diagnosis not present

## 2019-08-07 DIAGNOSIS — R05 Cough: Secondary | ICD-10-CM | POA: Diagnosis not present

## 2019-08-07 DIAGNOSIS — E781 Pure hyperglyceridemia: Secondary | ICD-10-CM | POA: Diagnosis not present

## 2019-08-07 DIAGNOSIS — E1169 Type 2 diabetes mellitus with other specified complication: Secondary | ICD-10-CM | POA: Diagnosis not present

## 2019-08-07 DIAGNOSIS — M353 Polymyalgia rheumatica: Secondary | ICD-10-CM | POA: Diagnosis not present

## 2019-08-07 DIAGNOSIS — Z1211 Encounter for screening for malignant neoplasm of colon: Secondary | ICD-10-CM | POA: Diagnosis not present

## 2019-08-07 DIAGNOSIS — F418 Other specified anxiety disorders: Secondary | ICD-10-CM | POA: Diagnosis not present

## 2019-08-07 DIAGNOSIS — I10 Essential (primary) hypertension: Secondary | ICD-10-CM | POA: Diagnosis not present

## 2019-08-07 DIAGNOSIS — E1142 Type 2 diabetes mellitus with diabetic polyneuropathy: Secondary | ICD-10-CM | POA: Diagnosis not present

## 2019-08-14 ENCOUNTER — Other Ambulatory Visit: Payer: Self-pay

## 2019-08-17 ENCOUNTER — Other Ambulatory Visit: Payer: PPO

## 2019-08-17 DIAGNOSIS — D72828 Other elevated white blood cell count: Secondary | ICD-10-CM | POA: Diagnosis not present

## 2019-08-17 DIAGNOSIS — E875 Hyperkalemia: Secondary | ICD-10-CM

## 2019-08-18 LAB — CBC WITH DIFFERENTIAL/PLATELET
Basophils Absolute: 0.1 10*3/uL (ref 0.0–0.2)
Basos: 1 %
EOS (ABSOLUTE): 0.3 10*3/uL (ref 0.0–0.4)
Eos: 2 %
Hematocrit: 43.2 % (ref 34.0–46.6)
Hemoglobin: 13.2 g/dL (ref 11.1–15.9)
Immature Grans (Abs): 0.2 10*3/uL — ABNORMAL HIGH (ref 0.0–0.1)
Immature Granulocytes: 1 %
Lymphocytes Absolute: 2.4 10*3/uL (ref 0.7–3.1)
Lymphs: 19 %
MCH: 25.6 pg — ABNORMAL LOW (ref 26.6–33.0)
MCHC: 30.6 g/dL — ABNORMAL LOW (ref 31.5–35.7)
MCV: 84 fL (ref 79–97)
Monocytes Absolute: 0.8 10*3/uL (ref 0.1–0.9)
Monocytes: 6 %
Neutrophils Absolute: 8.7 10*3/uL — ABNORMAL HIGH (ref 1.4–7.0)
Neutrophils: 71 %
Platelets: 393 10*3/uL (ref 150–450)
RBC: 5.16 x10E6/uL (ref 3.77–5.28)
RDW: 14.4 % (ref 11.7–15.4)
WBC: 12.4 10*3/uL — ABNORMAL HIGH (ref 3.4–10.8)

## 2019-08-18 LAB — COMPREHENSIVE METABOLIC PANEL
ALT: 11 IU/L (ref 0–32)
AST: 15 IU/L (ref 0–40)
Albumin/Globulin Ratio: 1.5 (ref 1.2–2.2)
Albumin: 4 g/dL (ref 3.8–4.8)
Alkaline Phosphatase: 90 IU/L (ref 39–117)
BUN/Creatinine Ratio: 19 (ref 12–28)
BUN: 18 mg/dL (ref 8–27)
Bilirubin Total: 0.3 mg/dL (ref 0.0–1.2)
CO2: 22 mmol/L (ref 20–29)
Calcium: 9.7 mg/dL (ref 8.7–10.3)
Chloride: 99 mmol/L (ref 96–106)
Creatinine, Ser: 0.94 mg/dL (ref 0.57–1.00)
GFR calc Af Amer: 73 mL/min/{1.73_m2} (ref >59)
GFR calc non Af Amer: 63 mL/min/{1.73_m2} (ref >59)
Globulin, Total: 2.6 g/dL (ref 1.5–4.5)
Glucose: 227 mg/dL — ABNORMAL HIGH (ref 65–99)
Potassium: 5.2 mmol/L (ref 3.5–5.2)
Sodium: 140 mmol/L (ref 134–144)
Total Protein: 6.6 g/dL (ref 6.0–8.5)

## 2019-08-25 ENCOUNTER — Other Ambulatory Visit: Payer: Self-pay | Admitting: Family Medicine

## 2019-08-28 IMAGING — CR DG CHEST 2V
2 series · 2 of 2 positions shown · non-contrast
Comparison: Radiographs November 16, 2013.

CLINICAL DATA: Preop for knee replacement.

EXAM:
CHEST - 2 VIEW

[w chest pa]
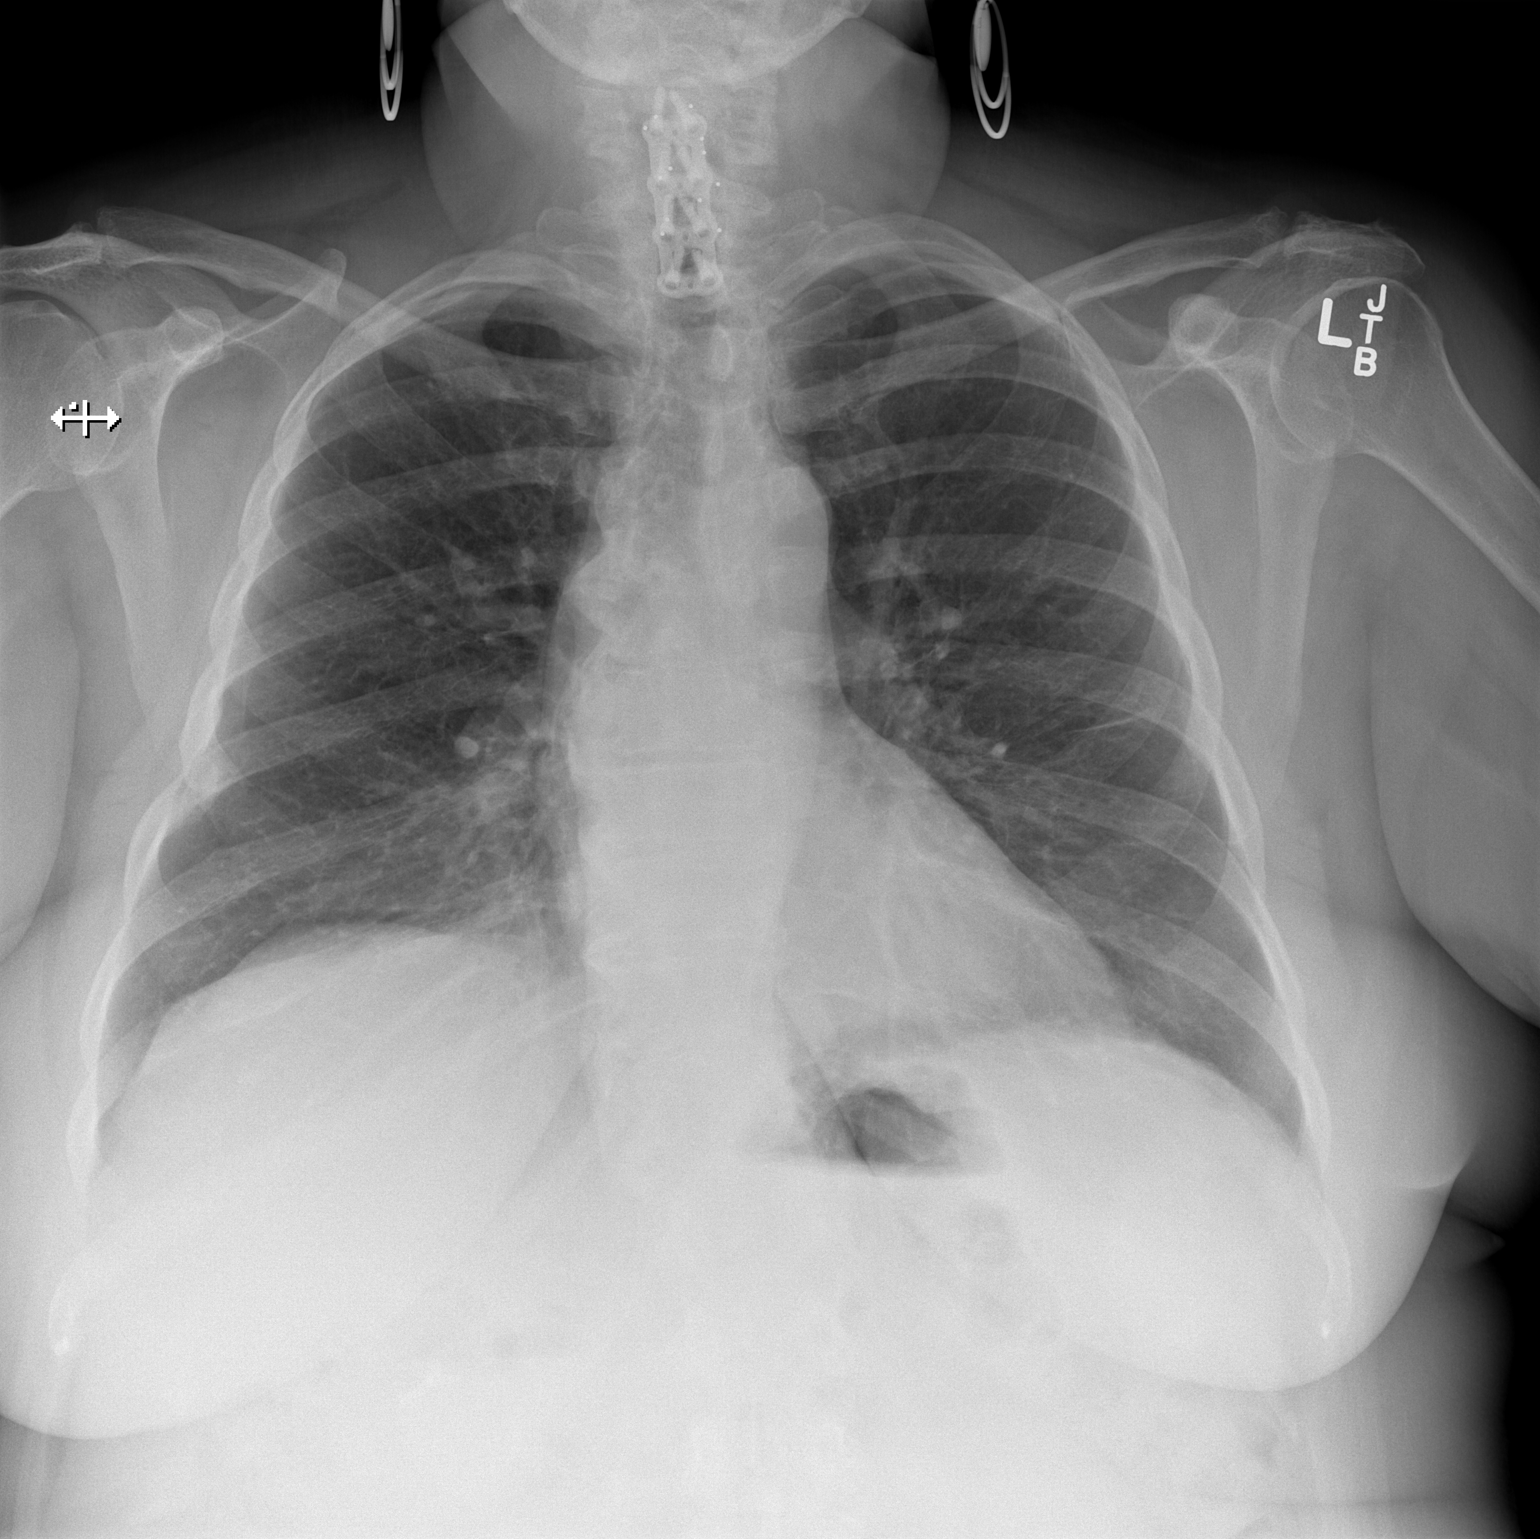

[w chest lat]
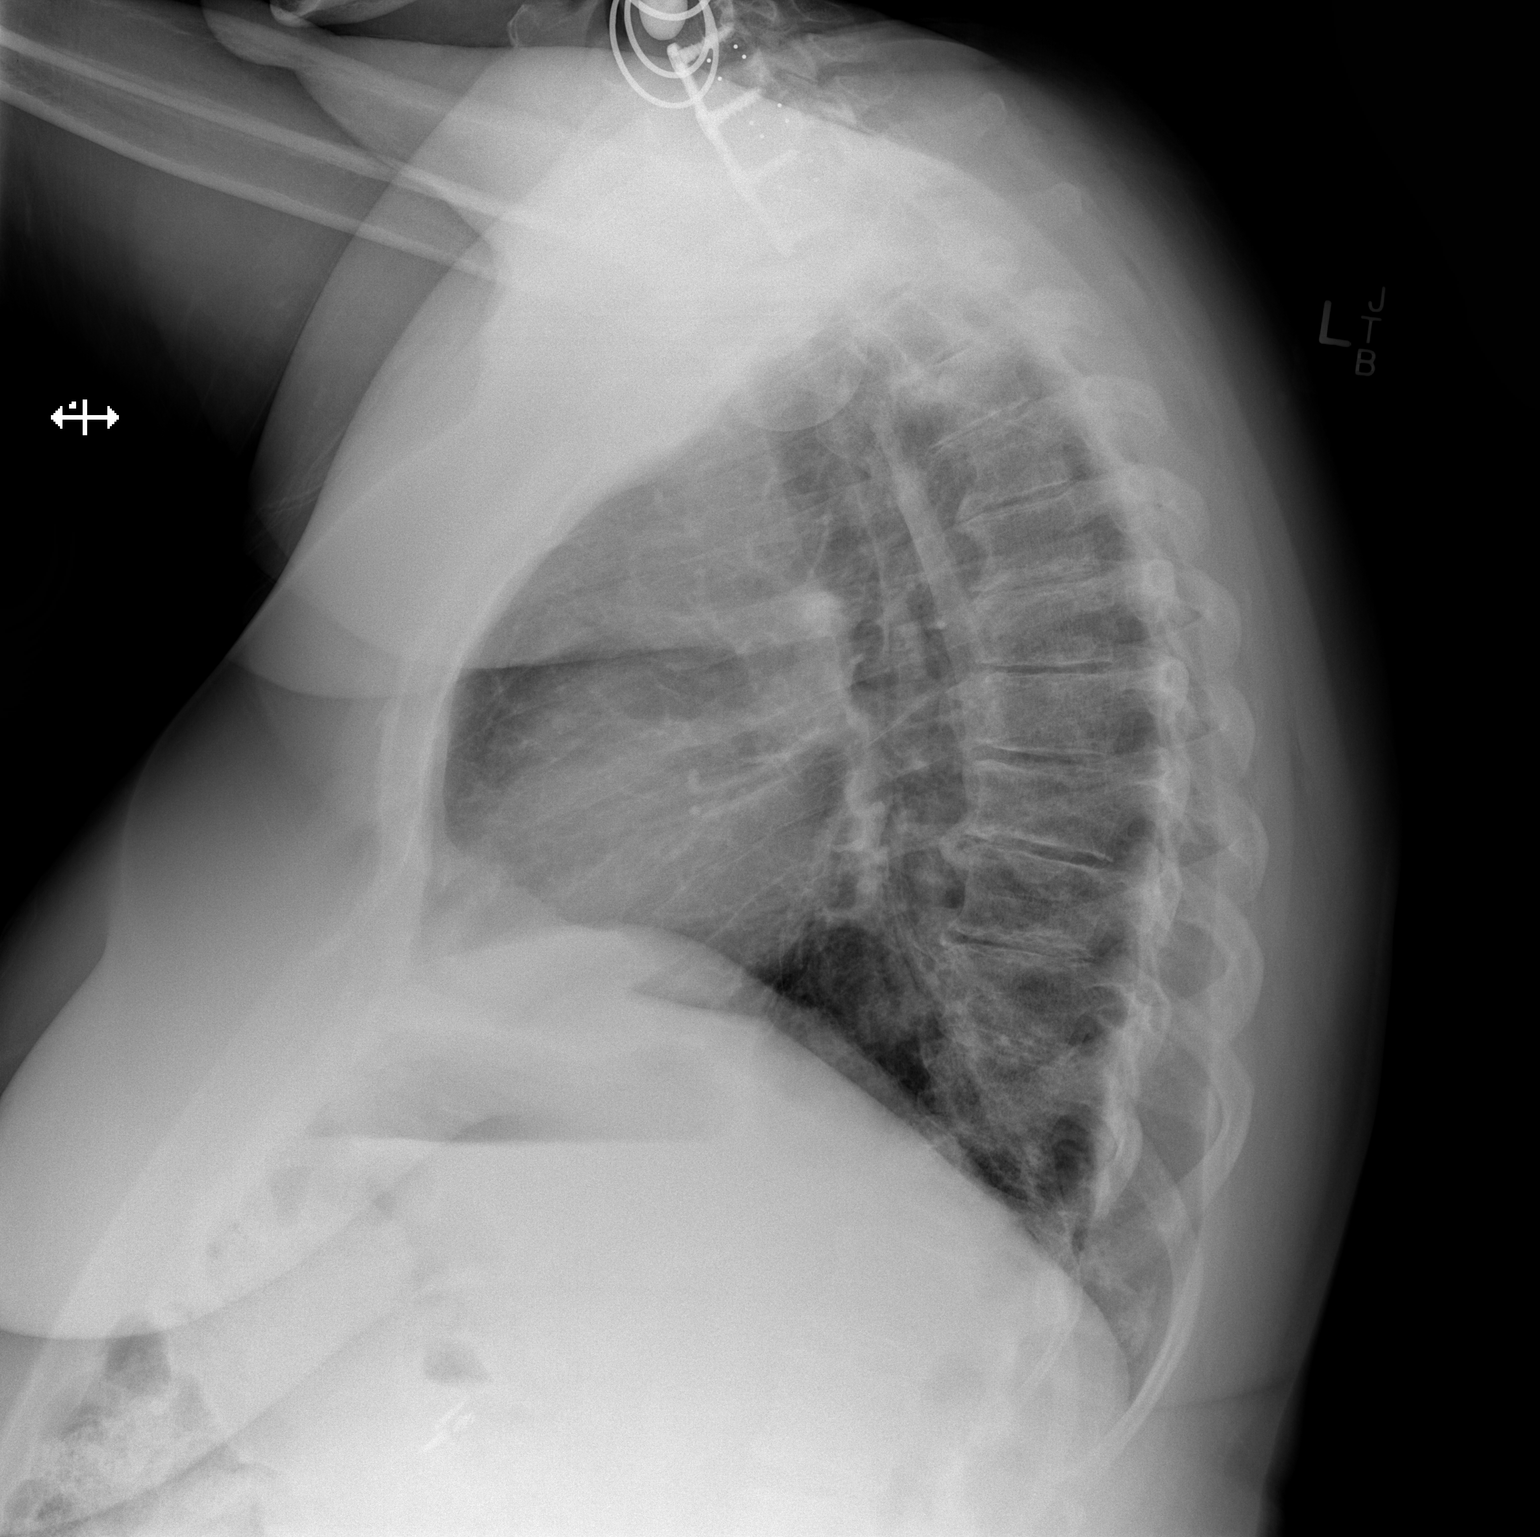

[2 of 2 positions shown; findings below may reference images not displayed]

FINDINGS: The heart size and mediastinal contours are within normal limits.
Both lungs are clear. The visualized skeletal structures are
unremarkable.
IMPRESSION: No active cardiopulmonary disease.

## 2019-09-03 ENCOUNTER — Other Ambulatory Visit: Payer: Self-pay | Admitting: Physician Assistant

## 2019-09-09 ENCOUNTER — Encounter: Payer: Self-pay | Admitting: Neurology

## 2019-09-09 ENCOUNTER — Other Ambulatory Visit: Payer: Self-pay

## 2019-09-09 ENCOUNTER — Ambulatory Visit: Payer: PPO | Admitting: Neurology

## 2019-09-09 VITALS — BP 107/72 | HR 96 | Temp 97.4°F | Ht 63.0 in | Wt 230.0 lb

## 2019-09-09 DIAGNOSIS — I6782 Cerebral ischemia: Secondary | ICD-10-CM

## 2019-09-09 DIAGNOSIS — I679 Cerebrovascular disease, unspecified: Secondary | ICD-10-CM

## 2019-09-09 DIAGNOSIS — R9082 White matter disease, unspecified: Secondary | ICD-10-CM

## 2019-09-09 NOTE — Progress Notes (Signed)
[id number]    Provider:  Dr Jaynee Eagles Requesting Provider: Carlton Adam, PA-C Primary Care Provider:  Marge Duncans, PA-C  CC:  White matter changes on MRI cervical spine  HPI:  Madison Stein is a 69 y.o. female here as requested by Carlton Adam, PA-C for white matter changes on MRI brain in the pons found incidentally on MRI cervical spine. PMHx peripheral neuropathy, hyperlipidemia, fibromyalgia, diabetes, depression, anxiety.No cognitive issues or memory changes, she has some shaking on the left side and she attributes it to hr neck, she has a proble with her legs her foot jerks but it doesn't affect walking, she uses a cane because of her knee replacements and hip replacement and low back pain she has a lot of arthritic changes currently hurting very badly. She started smoking in 1996 and quit same year not smoking a lot, no exposure to smoke or toxins, she has HLD and the triglycerides are elevated. Her pcp is with [facility], she has diabetes and it is 150-160s and hgba1c is 6.6. Mom and daughter have tremors, her tremor started years ago and worsening.Most of her pain is in her neck. No other focal neurologic deficits, associated symptoms, inciting events or modifiable factors.  Reviewed notes, labs and imaging from outside physicians, which showed:   I reviewed MRI of the cervical spine report in addition to personally reviewing images myself and with patient and I do agree there appears to be some white matter changes in the pons likely chronic microvascular ischemic changes but no stroke.  Cbc elevated glucose 227, cbc elevated wbcs 12.4, 08/17/2019 otherwise unremarkable  Review of Systems: Patient complains of symptoms per HPI as well as the following symptoms: Neck pain, diffuse osteoarthritis. Pertinent negatives and positives per HPI. All others negative.   Social History   Socioeconomic History  . Marital status: Divorced    Spouse name: Not  on file  . Number of children: 1  . Years of education: Not on file  . Highest education level: Some college, no degree  Occupational History  . Not on file  Tobacco Use  . Smoking status: Former Research scientist (life sciences)  . Smokeless tobacco: Never Used  . Tobacco comment: quit smoking in 1997  Substance and Sexual Activity  . Alcohol use: Not Currently    Comment: rare beer  . Drug use: No  . Sexual activity: Never    Birth control/protection: Post-menopausal  Other Topics Concern  . Not on file  Social History Narrative   Lives alone   Left handed   Caffeine: at the most 2 cups of coffee/day      ** Merged History Encounter **       Social Determinants of Health   Financial Resource Strain:   . Difficulty of Paying Living Expenses: Not on file  Food Insecurity:   . Worried About Charity fundraiser in the Last Year: Not on file  . Ran Out of Food in the Last Year: Not on file  Transportation Needs:   . Lack of Transportation (Medical): Not on file  . Lack of Transportation (Non-Medical): Not on file  Physical Activity:   . Days of Exercise per Week: Not on file  . Minutes of Exercise per Session: Not on file  Stress:   . Feeling of Stress : Not on file  Social Connections:   . Frequency of Communication with Friends and Family: Not on file  . Frequency of Social Gatherings with Friends and  Family: Not on file  . Attends Religious Services: Not on file  . Active Member of Clubs or Organizations: Not on file  . Attends Archivist Meetings: Not on file  . Marital Status: Not on file  Intimate Partner Violence:   . Fear of Current or Ex-Partner: Not on file  . Emotionally Abused: Not on file  . Physically Abused: Not on file  . Sexually Abused: Not on file    Family History  Problem Relation Age of Onset  . Alzheimer's disease Mother   . Kidney failure Father   . Diabetes Father   . Asthma Daughter   . Healthy Daughter     Past Medical History:  Diagnosis Date    . Anxiety   . Arthritis   . Bronchitis 2013   Hx of  . Complication of anesthesia   . Depression    takes Cymbalta daily  . Depression   . Diabetes mellitus without complication ([place])    takes Metformin daily  . Diabetes mellitus without complication ([place])    Type II  . Family history of adverse reaction to anesthesia    patients mother and sister gets sick  . Family history of anesthesia complication    mom and sister get very sick  . Fibromyalgia   . History of bronchitis 32yrs ago  . History of shingles   . Hyperlipidemia    takes Fish Oil bid  . Hyperlipidemia   . Insomnia    has Ambien if needed  . Joint pain   . Peripheral neuropathy    takes Lyrica daily  . Pneumonia 19yrs ago   hx of  . Pneumonia    hx of  . PONV (postoperative nausea and vomiting)   . Thyroid nodule   . Urgency of urination    Leakage wears pad    Patient Active Problem List   Diagnosis Date Noted  . Cerebrovascular disease 09/11/2019  . Non-toxic multinodular goiter 03/25/2018  . Fibromyalgia 03/05/2018  . Primary osteoarthritis of both hands 03/05/2018  . History of total replacement of both hip joints 03/05/2018  . Status post bilateral knee replacements 03/05/2018  . Primary osteoarthritis of both feet 03/05/2018  . DDD (degenerative disc disease), cervical 03/05/2018  . DDD (degenerative disc disease), lumbar 03/05/2018  . History of peripheral neuropathy 03/05/2018  . History of diabetes mellitus 03/05/2018  . History of hyperlipidemia 03/05/2018  . Other insomnia 03/05/2018  . History of depression 03/05/2018  . Family history of rheumatoid arthritis 03/05/2018  . Osteoarthritis of right knee 09/20/2017    Past Surgical History:  Procedure Laterality Date  . BACK SURGERY  1989   lumbar disc  . CARPAL TUNNEL RELEASE Right 1985  . CATARACT EXTRACTION  2020  . CERVICAL FUSION  2009  . CHOLECYSTECTOMY  2007  . COLONOSCOPY    . ESOPHAGOGASTRODUODENOSCOPY    . EYE SURGERY  Bilateral 2007   laser eye surgery   . HAMMER TOE SURGERY  2019  . HIP ARTHROPLASTY Bilateral   . REFRACTIVE SURGERY  2008  . TOTAL HIP ARTHROPLASTY Right 11/20/2013   Procedure: RIGHT TOTAL HIP ARTHROPLASTY;  Surgeon: Kerin Salen, MD;  Location: [place];  Service: Orthopedics;  Laterality: Right;  . TOTAL HIP ARTHROPLASTY Left 08/30/2014   Procedure: TOTAL HIP ARTHROPLASTY;  Surgeon: Kerin Salen, MD;  Location: [place];  Service: Orthopedics;  Laterality: Left;  . TOTAL KNEE ARTHROPLASTY Left 06/06/2015   Procedure: TOTAL KNEE ARTHROPLASTY;  Surgeon:  Frederik Pear, MD;  Location: [place];  Service: Orthopedics;  Laterality: Left;  . TOTAL KNEE ARTHROPLASTY Right 09/23/2017   Procedure: TOTAL KNEE ARTHROPLASTY;  Surgeon: Frederik Pear, MD;  Location: [place];  Service: Orthopedics;  Laterality: Right;  . TUBAL LIGATION  1987  . TUMOR REMOVAL Right    Hand  . tumor removed from right hand  2011  . ulnar nerve release Left 1996    Current Outpatient Medications  Medication Sig Dispense Refill  . acetaminophen (TYLENOL 8 HOUR ARTHRITIS PAIN) 650 MG CR tablet Take 650-1,300 mg by mouth 2 (two) times daily as needed for pain.    Marland Kitchen buPROPion (WELLBUTRIN XL) 300 MG 24 hr tablet TK 1 T PO QD    . diclofenac sodium (VOLTAREN) 1 % GEL Apply 3 gm to 3 large joints up to 3 times a day.Dispense 3 tubes with 3 refills. 3 Tube 1  . DULoxetine (CYMBALTA) 60 MG capsule Take 60 mg by mouth 2 (two) times daily.    Marland Kitchen gabapentin (NEURONTIN) 300 MG capsule TAKE ONE CAPSULE BY MOUTH EVERY MORNING AND 2 CAPSULES AT BEDTIME 90 capsule 0  . glimepiride (AMARYL) 4 MG tablet TAKE 1 TABLET BY MOUTH EVERY DAY 90 tablet 0  . methocarbamol (ROBAXIN) 750 MG tablet Take 1 tablet (750 mg total) by mouth 4 (four) times daily as needed for muscle spasms. 60 tablet 0  . Multiple Vitamin (MULTIVITAMIN WITH MINERALS) TABS tablet Take 1 tablet by mouth daily.    . rizatriptan (MAXALT-MLT) 10 MG disintegrating tablet DISSOLVE 1 TABLET ON  TONGUE, MAY REPEAT AT 2 HOUR INTERVALS IF NEEDED. MAXIMUM OF 3 TABLETS IN 24 HOURS 18 tablet 2  . SitaGLIPtin-MetFORMIN HCl (JANUMET XR) 3123460049 MG TB24 Take 1 tablet by mouth daily with supper.    Marland Kitchen VASCEPA 1 g capsule TAKE 2 CAPSULES BY MOUTH TWICE DAILY WITH FOOD 360 capsule 0  . aspirin EC 81 MG tablet Take 1 tablet (81 mg total) by mouth daily. 30 tablet 0   No current facility-administered medications for this visit.    Allergies as of 09/09/2019 - Review Complete 09/09/2019  Allergen Reaction Noted  . Morphine and related Nausea And Vomiting 05/25/2015    Vitals: BP 107/72 (BP Location: Right Arm, Patient Position: Sitting)   Pulse 96   Temp (!) 97.4 F (36.3 C) Comment: taken at front  Ht 5\' 3"  (1.6 m)   Wt 230 lb (104.3 kg)   BMI 40.74 kg/m  Last Weight:  Wt Readings from Last 1 Encounters:  09/09/19 230 lb (104.3 kg)   Last Height:   Ht Readings from Last 1 Encounters:  09/09/19 5\' 3"  (1.6 m)     Physical exam: Exam: Gen: NAD, conversant, well nourised, obese, well groomed                     CV: RRR, no MRG. No Carotid Bruits. No peripheral edema, warm, nontender Eyes: Conjunctivae clear without exudates or hemorrhage  Neuro: Detailed Neurologic Exam  Speech:    Speech is normal; fluent and spontaneous with normal comprehension.  Cognition:    The patient is oriented to person, place, and time;     recent and remote memory intact;     language fluent;     normal attention, concentration,     fund of knowledge Cranial Nerves:    The pupils are equal, round, and reactive to light.  Attempted funduscopic exam could not visualize due to small pupils.  Visual fields are full to finger confrontation. Extraocular movements are intact. Trigeminal sensation is intact and the muscles of mastication are normal. The face is symmetric. The palate elevates in the midline. Hearing intact. Voice is normal. Shoulder shrug is normal. The tongue has normal motion without  fasciculations.   Coordination:    Normal finger to nose(with tremor), difficulty HTS due to pain and weakness  Gait:    ambulates with a walking aid, good arm swing, not shuffling  Motor Observation:    No asymmetry, no atrophy, head tremor, positional and action tremor high frequency and low amplitude Tone:    Normal muscle tone.    Posture:    Erect    Strength: She has weakness of the left > right proximal legs (she reports hip and groin pain causing)       Sensation: intact to LT     Reflex Exam:  DTR's: 2+ left AJ, 1+ right AJ. Patellars absent but surgical. Normal uppers.     Deep tendon reflexes in the upper extremities are normal bilaterally.  Toes:    The toes are downgoing bilaterally.   Clonus:    Clonus is absent.    Assessment/Plan:  69 y.o. female here as requested by Carlton Adam, PA-C for white matter changes on MRI brain in the pons found incidentally on MRI cervical spine. Likely chronic microvascular ischemia. We discussed this in detail, she has multiple vascular risk factors such as obesity, hyperlipidemia, diabetes; we also discussed sequelae such as stroke and dementia.  I did advise her that very tight management of her vascular risk factors with her primary care is advised to help slow progression.  But we will order an MRI of the brain to look for strokes and extent of the white matter changes.Recommended the [facility].  MRI of the brain w/wo contrast for abnormal white matter changes in the brain Start asa 81mg  She follws with Marge Duncans, she says she had recent labs including thyroid and watches lipisd with Marge Duncans, I will CC Margarito Courser on this note.   Orders Placed This Encounter  Procedures  . MR BRAIN W WO CONTRAST   Meds ordered this encounter  Medications  . aspirin EC 81 MG tablet    Sig: Take 1 tablet (81 mg total) by mouth daily.    Dispense:  30 tablet    Refill:  0    I had a long d/w patient  about her cerebrovascular disease, risk for stroke/TIAs, personally independently reviewed imaging studies and stroke evaluation results and answered questions.Start ASA for secondary stroke prevention and maintain strict control of hypertension with blood pressure goal below 130/90, diabetes with hemoglobin A1c goal below 6.5% and lipids with LDL cholesterol goal below 70 mg/dL.  I also advised the patient to eat a healthy diet with plenty of whole grains, cereals, fruits and vegetables, exercise regularly and maintain ideal body weight.  Cc: Erline Levine, Marge Duncans, PA-C  Sarina Ill, MD  [facility] [address] [place], [place] [address]  Phone [phone] Fax [phone]

## 2019-09-09 NOTE — Patient Instructions (Addendum)
Start a baby aspirin MRI of the brain  I had a long d/w patient about her cerebrovascular disease, risk for stroke/TIAs, personally independently reviewed imaging studies and stroke evaluation results and answered questions.Start ASA for secondary stroke prevention and maintain strict control of hypertension with blood pressure goal below 130/90, diabetes with hemoglobin A1c goal below 6.5% and lipids with LDL cholesterol goal below 70 mg/dL.  I also advised the patient to eat a healthy diet with plenty of whole grains, cereals, fruits and vegetables, exercise regularly and maintain ideal body weight  Atherosclerosis  Atherosclerosis is narrowing and hardening of the arteries. Arteries are blood vessels that carry blood from the heart to all parts of the body. This blood contains oxygen. Arteries can become narrow or clogged with a buildup of fat, cholesterol, calcium, and other substances (plaque). Plaque decreases the amount of blood that can flow through the artery. Atherosclerosis can affect any artery in the body, including:  Heart arteries (coronary artery disease). This may cause a heart attack.  Brain arteries. This may cause a stroke (cerebrovascular accident).  Leg, arm, and pelvis arteries (peripheral artery disease). This may cause pain and numbness.  Kidney arteries. This may cause kidney (renal) failure. Treatment may slow the disease and prevent further damage to the heart, brain, peripheral arteries, and kidneys. What are the causes? Atherosclerosis develops slowly over many years. The inner layers of your arteries become damaged and allow the gradual buildup of plaque. The exact cause of atherosclerosis is not fully understood. Symptoms of atherosclerosis do not occur until the artery becomes narrow or blocked. What increases the risk? The following factors may make you more likely to develop this condition:  High blood pressure.  High cholesterol.  Being middle-aged or  older.  Having a family history of atherosclerosis.  Having high blood fats (triglycerides).  Diabetes.  Being overweight.  Smoking tobacco.  Not exercising enough (sedentary lifestyle).  Having a substance in the blood called C-reactive protein (CRP). This is a sign of increased levels of inflammation in the body.  Sleep apnea.  Being stressed.  Drinking too much alcohol. What are the signs or symptoms? This condition may not cause any symptoms. If you have symptoms, they are caused by damage to an area of your body that is not getting enough blood.  Coronary artery disease may cause chest pain and shortness of breath.  Decreased blood supply to your brain may cause a stroke. Signs of a stroke may include sudden: ? Weakness on one side of the body. ? Confusion. ? Changes in vision. ? Inability to speak or understand speech. ? Loss of balance, coordination, or the ability to walk. ? Severe headache. ? Loss of consciousness.  Peripheral arterial disease may cause pain and numbness, often in the legs and hips.  Renal failure may cause fatigue, nausea, swelling, and itchy skin. How is this diagnosed? This condition is diagnosed based on your medical history and a physical exam. During the exam:  Your health care provider will: ? Check your pulse in different places. ? Listen for a "whooshing" sound over your arteries (bruit).  You may have tests, such as: ? Blood tests to check your levels of cholesterol, triglycerides, and CRP. ? Electrocardiogram (ECG) to check for heart damage. ? Chest X-ray to see if you have an enlarged heart, which is a sign of heart failure. ? Stress test to see how your heart reacts to exercise. ? Echocardiogram to get images of the inside of your  heart. ? Ankle-brachial index to compare blood pressure in your arms to blood pressure in your ankles. ? Ultrasound of your peripheral arteries to check blood flow. ? CT scan to check for damage to  your heart or brain. ? X-rays of blood vessels after dye has been injected (angiogram) to check blood flow. How is this treated? Treatment starts with lifestyle changes, which may include:  Changing your diet.  Losing weight.  Reducing stress.  Exercising and being physically active more regularly.  Not smoking. You may also need medicine to:  Lower triglycerides and cholesterol.  Control blood pressure.  Prevent blood clots.  Lower inflammation in your body.  Control your blood sugar. Sometimes, surgery is needed to:  Remove plaque from an artery (endarterectomy).  Open or widen a narrowed heart artery (angioplasty).  Create a new path for your blood with one of these procedures: ? Heart (coronary) artery bypass graft surgery. ? Peripheral artery bypass graft surgery. Follow these instructions at home: Eating and drinking   Eat a heart-healthy diet. Talk with your health care provider or a diet and nutrition specialist (dietitian) if you need help. A heart-healthy diet involves: ? Limiting unhealthy fats and increasing healthy fats. Some examples of healthy fats are olive oil and canola oil. ? Eating plant-based foods, such as fruits, vegetables, nuts, whole grains, and legumes (such as peas and lentils).  Limit alcohol intake to no more than 1 drink a day for nonpregnant women and 2 drinks a day for men. One drink equals 12 oz of beer, 5 oz of wine, or 1 oz of hard liquor. Lifestyle  Follow an exercise program as told by your health care provider.  Maintain a healthy weight. Lose weight if your health care provider says that you need to do that.  Rest when you are tired.  Learn to manage your stress.  Do not use any products that contain nicotine or tobacco, such as cigarettes and e-cigarettes. If you need help quitting, ask your health care provider.  Do not abuse drugs. General instructions  Take over-the-counter and prescription medicines only as told  by your health care provider.  Manage other health conditions as told by your health care provider.  Keep all follow-up visits as told by your health care provider. This is important. Contact a health care provider if:  You have chest pain or discomfort. This includes squeezing chest pain that may feel like indigestion (angina).  You have shortness of breath.  You have an irregular heartbeat.  You have unexplained fatigue.  You have unexplained pain or numbness in an arm, leg, or hip.  You have nausea, swelling of your hands or feet, and itchy skin. Get help right away if:  You have any symptoms of a heart attack, such as: ? Chest pain. ? Shortness of breath. ? Pain in your neck, jaw, arms, back, or stomach. ? Cold sweat. ? Nausea. ? Light-headedness.  You have any symptoms of a stroke. "BE FAST" is an easy way to remember the main warning signs of a stroke: ? B - Balance. Signs are dizziness, sudden trouble walking, or loss of balance. ? E - Eyes. Signs are trouble seeing or a sudden change in vision. ? F - Face. Signs are sudden weakness or numbness of the face, or the face or eyelid drooping on one side. ? A - Arms. Signs are weakness or numbness in an arm. This happens suddenly and usually on one side of the body. ? S -  Speech. Signs are sudden trouble speaking, slurred speech, or trouble understanding what people say. ? T - Time. Time to call emergency services. Write down what time symptoms started.  You have other signs of a stroke, such as: ? A sudden, severe headache with no known cause. ? Nausea or vomiting. ? Seizure. These symptoms may represent a serious problem that is an emergency. Do not wait to see if the symptoms will go away. Get medical help right away. Call your local emergency services (911 in the [place] Do not drive yourself to the hospital. Summary  Atherosclerosis is narrowing and hardening of the arteries.  Arteries can become narrow or clogged  with a buildup of fat, cholesterol, calcium, and other substances (plaque).  This condition may not cause any symptoms. If you do have symptoms, they are caused by damage to an area of your body that is not getting enough blood.  Treatment may include lifestyle changes and medicines. In some cases, surgery is needed. This information is not intended to replace advice given to you by your health care provider. Make sure you discuss any questions you have with your health care provider. Document Revised: 10/04/2017 Document Reviewed: 02/28/2017 Elsevier Patient Education  [employer].   Essential Tremor A tremor is trembling or shaking that a person cannot control. Most tremors affect the hands or arms. Tremors can also affect the head, vocal cords, legs, and other parts of the body. Essential tremor is a tremor without a known cause. Usually, it occurs while a person is trying to perform an action. It tends to get worse gradually as a person ages. What are the causes? The cause of this condition is not known. What increases the risk? You are more likely to develop this condition if:  You have a family member with essential tremor.  You are age 51 or older.  You take certain medicines. What are the signs or symptoms? The main sign of a tremor is a rhythmic shaking of certain parts of your body that is uncontrolled and unintentional. You may:  Have difficulty eating with a spoon or fork.  Have difficulty writing.  Nod your head up and down or side to side.  Have a quivering voice. The shaking may:  Get worse over time.  Come and go.  Be more noticeable on one side of your body.  Get worse due to stress, fatigue, caffeine, and extreme heat or cold. How is this diagnosed? This condition may be diagnosed based on:  Your symptoms and medical history.  A physical exam. There is no single test to diagnose an essential tremor. However, your health care provider may order  tests to rule out other causes of your condition. These may include:  Blood and urine tests.  Imaging studies of your brain, such as CT scan and MRI.  A test that measures involuntary muscle movement (electromyogram). How is this treated? Treatment for essential tremor depends on the severity of the condition.  Some tremors may go away without treatment.  Mild tremors may not need treatment if they do not affect your day-to-day life.  Severe tremors may need to be treated using one or more of the following options: ? Medicines. ? Lifestyle changes. ? Occupational or physical therapy. Follow these instructions at home: Lifestyle   Do not use any products that contain nicotine or tobacco, such as cigarettes and e-cigarettes. If you need help quitting, ask your health care provider.  Limit your caffeine intake as told  by your health care provider.  Try to get 8 hours of sleep each night.  Find ways to manage your stress that fits your lifestyle and personality. Consider trying meditation or yoga.  Try to anticipate stressful situations and allow extra time to manage them.  If you are struggling emotionally with the effects of your tremor, consider working with a mental health provider. General instructions  Take over-the-counter and prescription medicines only as told by your health care provider.  Avoid extreme heat and extreme cold.  Keep all follow-up visits as told by your health care provider. This is important. Visits may include physical therapy visits. Contact a health care provider if:  You experience any changes in the location or intensity of your tremors.  You start having a tremor after starting a new medicine.  You have tremor with other symptoms, such as: ? Numbness. ? Tingling. ? Pain. ? Weakness.  Your tremor gets worse.  Your tremor interferes with your daily life.  You feel down, blue, or sad for at least 2 weeks in a row.  Worrying about your  tremor and what other people think about you interferes with your everyday life functions, including relationships, work, or school. Summary  Essential tremor is a tremor without a known cause. Usually, it occurs when you are trying to perform an action.  The cause of this condition is not known.  The main sign of a tremor is a rhythmic shaking of certain parts of your body that is uncontrolled and unintentional.  Treatment for essential tremor depends on the severity of the condition. This information is not intended to replace advice given to you by your health care provider. Make sure you discuss any questions you have with your health care provider. Document Revised: 07/05/2017 Document Reviewed: 07/05/2017 Elsevier Patient Education  2020 [employer].

## 2019-09-11 ENCOUNTER — Encounter: Payer: Self-pay | Admitting: Neurology

## 2019-09-11 DIAGNOSIS — I679 Cerebrovascular disease, unspecified: Secondary | ICD-10-CM | POA: Insufficient documentation

## 2019-09-11 MED ORDER — ASPIRIN EC 81 MG PO TBEC: 81.0000 mg | DELAYED_RELEASE_TABLET | Freq: Every day | ORAL | 0 refills | Status: AC

## 2019-09-14 ENCOUNTER — Telehealth: Payer: Self-pay | Admitting: Neurology

## 2019-09-14 NOTE — Telephone Encounter (Signed)
health team order sent to GI. No auth they will reach out to the patient to schedule.  

## 2019-09-24 ENCOUNTER — Other Ambulatory Visit: Payer: Self-pay | Admitting: Family Medicine

## 2019-10-10 ENCOUNTER — Other Ambulatory Visit: Payer: PPO

## 2019-10-18 ENCOUNTER — Ambulatory Visit
Admission: RE | Admit: 2019-10-18 | Discharge: 2019-10-18 | Disposition: A | Discharge status: Home / Self Care | Payer: PPO | Source: Ambulatory Visit | Attending: Neurology | Admitting: Neurology

## 2019-10-18 ENCOUNTER — Other Ambulatory Visit: Payer: Self-pay

## 2019-10-18 DIAGNOSIS — I679 Cerebrovascular disease, unspecified: Secondary | ICD-10-CM

## 2019-10-18 DIAGNOSIS — I6782 Cerebral ischemia: Secondary | ICD-10-CM | POA: Diagnosis not present

## 2019-10-18 DIAGNOSIS — R9082 White matter disease, unspecified: Secondary | ICD-10-CM | POA: Diagnosis not present

## 2019-10-18 MED ORDER — GADOBENATE DIMEGLUMINE 529 MG/ML IV SOLN
20.0000 mL | Freq: Once | INTRAVENOUS | Status: AC | PRN
  Administered 2019-10-18: 20 mL via INTRAVENOUS

## 2019-10-24 ENCOUNTER — Other Ambulatory Visit: Payer: Self-pay | Admitting: Family Medicine

## 2019-10-24 ENCOUNTER — Other Ambulatory Visit: Payer: Self-pay | Admitting: Physician Assistant

## 2019-10-27 ENCOUNTER — Other Ambulatory Visit: Payer: Self-pay

## 2019-10-27 MED ORDER — MONTELUKAST SODIUM 10 MG PO TABS: 10.0000 mg | ORAL_TABLET | Freq: Every day | ORAL | 0 refills | Status: DC

## 2019-11-06 ENCOUNTER — Other Ambulatory Visit: Payer: Self-pay

## 2019-11-06 ENCOUNTER — Encounter: Payer: Self-pay | Admitting: Physician Assistant

## 2019-11-06 ENCOUNTER — Ambulatory Visit (INDEPENDENT_AMBULATORY_CARE_PROVIDER_SITE_OTHER): Payer: PPO | Admitting: Physician Assistant

## 2019-11-06 VITALS — BP 130/58 | HR 115 | Temp 97.0°F | Ht 63.0 in | Wt 226.0 lb

## 2019-11-06 DIAGNOSIS — Z8639 Personal history of other endocrine, nutritional and metabolic disease: Secondary | ICD-10-CM

## 2019-11-06 DIAGNOSIS — F418 Other specified anxiety disorders: Secondary | ICD-10-CM | POA: Diagnosis not present

## 2019-11-06 DIAGNOSIS — E782 Mixed hyperlipidemia: Secondary | ICD-10-CM | POA: Diagnosis not present

## 2019-11-06 DIAGNOSIS — E1142 Type 2 diabetes mellitus with diabetic polyneuropathy: Secondary | ICD-10-CM

## 2019-11-06 DIAGNOSIS — E781 Pure hyperglyceridemia: Secondary | ICD-10-CM | POA: Insufficient documentation

## 2019-11-06 DIAGNOSIS — E040 Nontoxic diffuse goiter: Secondary | ICD-10-CM | POA: Insufficient documentation

## 2019-11-06 DIAGNOSIS — M353 Polymyalgia rheumatica: Secondary | ICD-10-CM | POA: Diagnosis not present

## 2019-11-06 NOTE — Progress Notes (Signed)
Established Patient Office Visit  Subjective:  Patient ID: Madison Stein, female    DOB: 04-15-1951  Age: 69 y.o. MRN: [id number]  CC:  Chief Complaint  Patient presents with  . Depression  . Diabetes    HPI ZIXUAN Stein presents for follow up diabetes   Patient presents with type 2 diabetes mellitus with diabetic polyneuropathy.  Specifically, this is type 2, non-insulin requiring diabetes without complications.  Date of diagnosis 08/2011.  Compliance with treatment has been fair; she skips some medication doses.  Primary symptoms reported include nerve dysfunction (manifested as burning ).  She specifically denies blurred vision, fatigue, headache, hypoglycemia, leg cramps, nocturia, excessive thirst or frequent urination.  Depression screening is positive for depression - stable on Cymbalta.   Tobacco screen: Non-smoker.  Current meds include an oral hypoglycemic ( Amaryl ( 4 mg QD ) and Janumet XR 100/1000 mg QD ) and Neurontin 300 mg 1 po q am and 2 po qhs for peripheral neuropathy.  She reports home blood glucose readings have been fairly good, with average fasting glucoses running in the 130-140 mg/dL range. She checks her glucose 1 to 2 times daily.    In regard to preventative care, she performs foot self-exams weekly and her last ophthalmology exam was in 09/19/2018 - no diabetic retinopathy.  Concurrent health problems include hypercholesterolemia.      Pt presents with hyperlipidemia.  Date of diagnosis 06/2012.  Current treatment includes Vascepa.  Compliance with treatment has been good; she takes her medication as directed and maintains her low cholesterol diet.  She denies experiencing any hypercholesterolemia related symptoms.  Concurrent health problems include diabetes.      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  Her symptom complex includes apprehension.  The frequency symptoms is several times per day.  Apparent  triggers include occupational stressors and marital discord.  Current treatment includes Cymbalta and ativan 0.5mg  qd.  and also wellbutrin XL 300mg  qdShe denies pertinent past medical history.  Family history is pertinent for anxiety and depression.      Polymyalgia rheumatica details; this was diagnosed years ago.  Compliance with treatment has been good; Madison Stein takes her medication as directed, maintains her diet and exercise regimen, and follows up as directed.  Primary complaints include joint stiffness. She denies associated swelling, redness, warmth, deformity or myalgias.  The pattern of joint symptomatology has been stable and nonprogressive.  Primary joints affected include the cervical and lumbar spine and knees.  Support mechanisms include family, friends, and husband.  Pertinent medical history is remarkable for polymyalgia rheumatica.  Family history is remarkable for osteoarthritis and rheumatoid arthritis.  Currently on Neurontin 300 mg 1 QAM and 2 QPM, Cymbalta 60 mg BID    Past Medical History:  Diagnosis Date  . Anxiety   . Arthritis   . Bronchitis 2013   Hx of  . Complication of anesthesia   . Depression    takes Cymbalta daily  . Depression   . Depression with anxiety   . Diabetes mellitus without complication ([employer])    takes Metformin daily  . Diabetes mellitus without complication ([place])    Type II  . Family history of adverse reaction to anesthesia    patients mother and sister gets sick  . Family history of anesthesia complication    mom and sister get very sick  . Fibromyalgia   . History of bronchitis 28yrs ago  . History of shingles   .  Hyperlipidemia    takes Fish Oil bid  . Hyperlipidemia   . Insomnia    has Ambien if needed  . Joint pain   . Nontoxic (diffuse) goiter   . Peripheral neuropathy    takes Lyrica daily  . Pneumonia 31yrs ago   hx of  . Pneumonia    hx of  . Polymyalgia rheumatica ([place])   . PONV (postoperative nausea and vomiting)   .  Pure hyperglyceridemia   . Thyroid nodule   . Type 2 diabetes mellitus with diabetic polyneuropathy ([place])   . Urgency of urination    Leakage wears pad    Past Surgical History:  Procedure Laterality Date  . BACK SURGERY  1989   lumbar disc  . CARPAL TUNNEL RELEASE Right 1985  . CATARACT EXTRACTION  2020  . CERVICAL FUSION  2009  . CHOLECYSTECTOMY  2007  . COLONOSCOPY    . ESOPHAGOGASTRODUODENOSCOPY    . EYE SURGERY Bilateral 2007   laser eye surgery   . HAMMER TOE SURGERY  2019  . HIP ARTHROPLASTY Bilateral   . REFRACTIVE SURGERY  2008  . TOTAL HIP ARTHROPLASTY Right 11/20/2013   Procedure: RIGHT TOTAL HIP ARTHROPLASTY;  Surgeon: Kerin Salen, MD;  Location: [facility];  Service: Orthopedics;  Laterality: Right;  . TOTAL HIP ARTHROPLASTY Left 08/30/2014   Procedure: TOTAL HIP ARTHROPLASTY;  Surgeon: Kerin Salen, MD;  Location: [facility];  Service: Orthopedics;  Laterality: Left;  . TOTAL KNEE ARTHROPLASTY Left 06/06/2015   Procedure: TOTAL KNEE ARTHROPLASTY;  Surgeon: Frederik Pear, MD;  Location: [facility];  Service: Orthopedics;  Laterality: Left;  . TOTAL KNEE ARTHROPLASTY Right 09/23/2017   Procedure: TOTAL KNEE ARTHROPLASTY;  Surgeon: Frederik Pear, MD;  Location: [facility];  Service: Orthopedics;  Laterality: Right;  . TUBAL LIGATION  1987  . TUMOR REMOVAL Right    Hand  . tumor removed from right hand  2011  . ulnar nerve release Left 1996    Family History  Problem Relation Age of Onset  . Alzheimer's disease Mother   . Kidney failure Father   . Diabetes Father   . Asthma Daughter   . Healthy Daughter     Social History   Socioeconomic History  . Marital status: Divorced    Spouse name: Not on file  . Number of children: 1  . Years of education: Not on file  . Highest education level: Some college, no degree  Occupational History  . Not on file  Tobacco Use  . Smoking status: Former Research scientist (life sciences)  . Smokeless tobacco: Never Used  . Tobacco comment: quit smoking in 1997    Substance and Sexual Activity  . Alcohol use: Not Currently    Comment: rare beer  . Drug use: No  . Sexual activity: Never    Birth control/protection: Post-menopausal  Other Topics Concern  . Not on file  Social History Narrative   Lives alone   Left handed   Caffeine: at the most 2 cups of coffee/day      ** Merged History Encounter **       Social Determinants of Health   Financial Resource Strain:   . Difficulty of Paying Living Expenses:   Food Insecurity:   . Worried About Charity fundraiser in the Last Year:   . Arboriculturist in the Last Year:   Transportation Needs:   . Film/video editor (Medical):   Marland Kitchen Lack of Transportation (Non-Medical):   Physical  Activity:   . Days of Exercise per Week:   . Minutes of Exercise per Session:   Stress:   . Feeling of Stress :   Social Connections:   . Frequency of Communication with Friends and Family:   . Frequency of Social Gatherings with Friends and Family:   . Attends Religious Services:   . Active Member of Clubs or Organizations:   . Attends Archivist Meetings:   Marland Kitchen Marital Status:   Intimate Partner Violence:   . Fear of Current or Ex-Partner:   . Emotionally Abused:   Marland Kitchen Physically Abused:   . Sexually Abused:      Current Outpatient Medications:  .  acetaminophen (TYLENOL 8 HOUR ARTHRITIS PAIN) 650 MG CR tablet, Take 650-1,300 mg by mouth 2 (two) times daily as needed for pain., Disp: , Rfl:  .  aspirin EC 81 MG tablet, Take 1 tablet (81 mg total) by mouth daily., Disp: 30 tablet, Rfl: 0 .  buPROPion (WELLBUTRIN XL) 300 MG 24 hr tablet, TK 1 T PO QD, Disp: , Rfl:  .  diclofenac sodium (VOLTAREN) 1 % GEL, Apply 3 gm to 3 large joints up to 3 times a day.Dispense 3 tubes with 3 refills., Disp: 3 Tube, Rfl: 1 .  DULoxetine (CYMBALTA) 60 MG capsule, Take 60 mg by mouth 2 (two) times daily., Disp: , Rfl:  .  FREESTYLE LITE test strip, CHECK BLOOD SUGAR EVERY DAY, Disp: , Rfl:  .  gabapentin  (NEURONTIN) 300 MG capsule, TAKE ONE CAPSULE BY MOUTH EVERY MORNING AND 2 CAPSULES AT BEDTIME, Disp: 270 capsule, Rfl: 1 .  glimepiride (AMARYL) 4 MG tablet, TAKE 1 TABLET BY MOUTH EVERY DAY, Disp: 90 tablet, Rfl: 0 .  lisinopril (ZESTRIL) 2.5 MG tablet, TAKE 1 TABLET BY MOUTH ONCE DAILY FOR RENAL PROTECTION, Disp: 90 tablet, Rfl: 1 .  methocarbamol (ROBAXIN) 750 MG tablet, Take 1 tablet (750 mg total) by mouth 4 (four) times daily as needed for muscle spasms., Disp: 60 tablet, Rfl: 0 .  montelukast (SINGULAIR) 10 MG tablet, Take 1 tablet (10 mg total) by mouth at bedtime., Disp: 90 tablet, Rfl: 0 .  Multiple Vitamin (MULTIVITAMIN WITH MINERALS) TABS tablet, Take 1 tablet by mouth daily., Disp: , Rfl:  .  rizatriptan (MAXALT-MLT) 10 MG disintegrating tablet, DISSOLVE 1 TABLET ON TONGUE, MAY REPEAT AT 2 HOUR INTERVALS IF NEEDED. MAXIMUM OF 3 TABLETS IN 24 HOURS, Disp: 18 tablet, Rfl: 2 .  SitaGLIPtin-MetFORMIN HCl (JANUMET XR) [phone] MG TB24, Take 1 tablet by mouth daily with supper., Disp: , Rfl:  .  VASCEPA 1 g capsule, TAKE 2 CAPSULES BY MOUTH TWICE DAILY WITH FOOD, Disp: 360 capsule, Rfl: 0   Allergies  Allergen Reactions  . Morphine And Related Nausea And Vomiting    "just don't tolerate it well"    ROS CONSTITUTIONAL: Negative for chills, fatigue, fever, unintentional weight gain and unintentional weight loss.  E/N/T: Negative for ear pain, nasal congestion and sore throat.  CARDIOVASCULAR: Negative for chest pain, dizziness, palpitations and pedal edema.  RESPIRATORY: Negative for recent cough and dyspnea.  GASTROINTESTINAL: Negative for abdominal pain, acid reflux symptoms, constipation, diarrhea, nausea and vomiting.  MSK: see HPI INTEGUMENTARY: Negative for rash.  NEUROLOGICAL: Negative for dizziness and headaches.  PSYCHIATRIC: Negative for sleep disturbance and to question depression screen.  Negative for depression, negative for anhedonia.        Objective:    PHYSICAL  EXAM:   VS: BP (!) 130/58  Pulse (!) 115   Temp (!) 97 F (36.1 C)   Ht 5\' 3"  (1.6 m)   Wt 226 lb (102.5 kg)   SpO2 92%   BMI 40.03 kg/m   GEN: Well nourished, well developed, in no acute distress   Neck: no JVD or masses - no thyromegaly Cardiac: RRR; no murmurs, rubs, or gallops,no edema - no significant varicosities Respiratory:  normal respiratory rate and pattern with no distress - normal breath sounds with no rales, rhonchi, wheezes or rubs  Skin: warm and dry, no rash  Neuro:  Alert and Oriented x 3, Strength and sensation are intact - CN II-Xii grossly intact Psych: euthymic mood, appropriate affect and demeanor  BP (!) 130/58   Pulse (!) 115   Temp (!) 97 F (36.1 C)   Ht 5\' 3"  (1.6 m)   Wt 226 lb (102.5 kg)   SpO2 92%   BMI 40.03 kg/m  Wt Readings from Last 3 Encounters:  11/06/19 226 lb (102.5 kg)  09/09/19 230 lb (104.3 kg)  03/25/18 201 lb (91.2 kg)   Diabetic Foot Exam - Simple   Simple Foot Form Diabetic Foot exam was performed with the following findings: Yes 11/06/2019  8:56 AM  Visual Inspection No deformities, no ulcerations, no other skin breakdown bilaterally: Yes Sensation Testing Intact to touch and monofilament testing bilaterally: Yes Pulse Check Posterior Tibialis and Dorsalis pulse intact bilaterally: Yes Comments      Health Maintenance Due  Topic Date Due  . Hepatitis C Screening  Never done  . OPHTHALMOLOGY EXAM  Never done  . COVID-19 Vaccine (1) Never done  . TETANUS/TDAP  Never done  . MAMMOGRAM  Never done  . COLONOSCOPY  Never done  . DEXA SCAN  Never done  . PNA vac Low Risk Adult (1 of 2 - PCV13) Never done  . HEMOGLOBIN A1C  03/26/2018    There are no preventive care reminders to display for this patient.  No results found for: TSH Lab Results  Component Value Date   WBC 12.4 (H) 08/17/2019   HGB 13.2 08/17/2019   HCT 43.2 08/17/2019   MCV 84 08/17/2019   PLT 393 08/17/2019   Lab Results  Component Value  Date   NA 140 08/17/2019   K 5.2 08/17/2019   CO2 22 08/17/2019   GLUCOSE 227 (H) 08/17/2019   BUN 18 08/17/2019   CREATININE 0.94 08/17/2019   BILITOT 0.3 08/17/2019   ALKPHOS 90 08/17/2019   AST 15 08/17/2019   ALT 11 08/17/2019   PROT 6.6 08/17/2019   ALBUMIN 4.0 08/17/2019   CALCIUM 9.7 08/17/2019   ANIONGAP 7 09/24/2017   No results found for: CHOL No results found for: HDL No results found for: LDLCALC No results found for: TRIG No results found for: CHOLHDL Lab Results  Component Value Date   HGBA1C 6.1 (H) 09/23/2017      Assessment & Plan:   Problem List Items Addressed This Visit      Endocrine   Type 2 diabetes mellitus with diabetic polyneuropathy ([place])    Well controlled.  No changes to medicines.  Continue to work on eating a healthy diet and exercise.  Labs drawn today.        Relevant Orders   Hemoglobin A1c     Other   History of hyperlipidemia - Primary    Well controlled.  No changes to medicines.  Continue to work on eating a healthy diet and exercise.  Labs  drawn today.        Depression with anxiety    Continue current meds as directed Follow up as scheduled      Polymyalgia rheumatica ([place])    Continue current meds as directed      Relevant Orders   CBC with Differential/Platelet   Comprehensive metabolic panel   TSH   RESOLVED: Pure hyperglyceridemia      No orders of the defined types were placed in this encounter.   Follow-up: Return in about 3 months (around 02/05/2020) for chronic fasting follow up.    SARA R Travonte Byard, PA-C

## 2019-11-06 NOTE — Assessment & Plan Note (Signed)
Continue current meds as directed 

## 2019-11-06 NOTE — Assessment & Plan Note (Signed)
Well controlled.  ?No changes to medicines.  ?Continue to work on eating a healthy diet and exercise.  ?Labs drawn today.  ?

## 2019-11-06 NOTE — Assessment & Plan Note (Signed)
Continue current meds as directed Follow up as scheduled

## 2019-11-07 ENCOUNTER — Other Ambulatory Visit: Payer: PPO

## 2019-11-07 LAB — CBC WITH DIFFERENTIAL/PLATELET
Basophils Absolute: 0.1 10*3/uL (ref 0.0–0.2)
Basos: 1 %
EOS (ABSOLUTE): 0.3 10*3/uL (ref 0.0–0.4)
Eos: 3 %
Hematocrit: 40.4 % (ref 34.0–46.6)
Hemoglobin: 12.8 g/dL (ref 11.1–15.9)
Immature Grans (Abs): 0.1 10*3/uL (ref 0.0–0.1)
Immature Granulocytes: 1 %
Lymphocytes Absolute: 2.8 10*3/uL (ref 0.7–3.1)
Lymphs: 23 %
MCH: 26.1 pg — ABNORMAL LOW (ref 26.6–33.0)
MCHC: 31.7 g/dL (ref 31.5–35.7)
MCV: 82 fL (ref 79–97)
Monocytes Absolute: 0.8 10*3/uL (ref 0.1–0.9)
Monocytes: 7 %
Neutrophils Absolute: 7.9 10*3/uL — ABNORMAL HIGH (ref 1.4–7.0)
Neutrophils: 65 %
Platelets: 371 10*3/uL (ref 150–450)
RBC: 4.91 x10E6/uL (ref 3.77–5.28)
RDW: 13.5 % (ref 11.7–15.4)
WBC: 12 10*3/uL — ABNORMAL HIGH (ref 3.4–10.8)

## 2019-11-07 LAB — COMPREHENSIVE METABOLIC PANEL
ALT: 12 IU/L (ref 0–32)
AST: 16 IU/L (ref 0–40)
Albumin/Globulin Ratio: 1.5 (ref 1.2–2.2)
Albumin: 4.1 g/dL (ref 3.8–4.8)
Alkaline Phosphatase: 95 IU/L (ref 39–117)
BUN/Creatinine Ratio: 18 (ref 12–28)
BUN: 19 mg/dL (ref 8–27)
Bilirubin Total: 0.2 mg/dL (ref 0.0–1.2)
CO2: 23 mmol/L (ref 20–29)
Calcium: 9.7 mg/dL (ref 8.7–10.3)
Chloride: 99 mmol/L (ref 96–106)
Creatinine, Ser: 1.06 mg/dL — ABNORMAL HIGH (ref 0.57–1.00)
GFR calc Af Amer: 63 mL/min/{1.73_m2} (ref >59)
GFR calc non Af Amer: 55 mL/min/{1.73_m2} — ABNORMAL LOW (ref >59)
Globulin, Total: 2.7 g/dL (ref 1.5–4.5)
Glucose: 176 mg/dL — ABNORMAL HIGH (ref 65–99)
Potassium: 5.1 mmol/L (ref 3.5–5.2)
Sodium: 138 mmol/L (ref 134–144)
Total Protein: 6.8 g/dL (ref 6.0–8.5)

## 2019-11-07 LAB — HEMOGLOBIN A1C
Est. average glucose Bld gHb Est-mCnc: 151 mg/dL
Hgb A1c MFr Bld: 6.9 % — ABNORMAL HIGH (ref 4.8–5.6)

## 2019-11-07 LAB — LIPID PANEL
Chol/HDL Ratio: 3.1 ratio (ref 0.0–4.4)
Cholesterol, Total: 169 mg/dL (ref 100–199)
HDL: 54 mg/dL (ref >39)
LDL Chol Calc (NIH): 81 mg/dL (ref 0–99)
Triglycerides: 207 mg/dL — ABNORMAL HIGH (ref 0–149)
VLDL Cholesterol Cal: 34 mg/dL (ref 5–40)

## 2019-11-07 LAB — CARDIOVASCULAR RISK ASSESSMENT

## 2019-11-07 LAB — TSH: TSH: 1.22 u[IU]/mL (ref 0.450–4.500)

## 2019-11-16 ENCOUNTER — Other Ambulatory Visit: Payer: Self-pay

## 2019-11-16 ENCOUNTER — Other Ambulatory Visit: Payer: Self-pay | Admitting: Physician Assistant

## 2019-11-16 MED ORDER — BUPROPION HCL ER (XL) 300 MG PO TB24: 300.0000 mg | ORAL_TABLET | Freq: Every day | ORAL | 3 refills | Status: DC

## 2019-11-18 DIAGNOSIS — M4802 Spinal stenosis, cervical region: Secondary | ICD-10-CM | POA: Diagnosis not present

## 2019-11-18 DIAGNOSIS — M47816 Spondylosis without myelopathy or radiculopathy, lumbar region: Secondary | ICD-10-CM | POA: Diagnosis not present

## 2019-11-18 DIAGNOSIS — G939 Disorder of brain, unspecified: Secondary | ICD-10-CM | POA: Diagnosis not present

## 2019-11-18 DIAGNOSIS — G959 Disease of spinal cord, unspecified: Secondary | ICD-10-CM | POA: Diagnosis not present

## 2019-11-25 ENCOUNTER — Ambulatory Visit (INDEPENDENT_AMBULATORY_CARE_PROVIDER_SITE_OTHER): Payer: PPO | Admitting: Physician Assistant

## 2019-11-25 ENCOUNTER — Encounter: Payer: Self-pay | Admitting: Physician Assistant

## 2019-11-25 ENCOUNTER — Other Ambulatory Visit: Payer: Self-pay

## 2019-11-25 VITALS — BP 132/62 | HR 103 | Temp 97.1°F | Resp 16 | Wt 226.0 lb

## 2019-11-25 DIAGNOSIS — F418 Other specified anxiety disorders: Secondary | ICD-10-CM

## 2019-11-25 DIAGNOSIS — R05 Cough: Secondary | ICD-10-CM

## 2019-11-25 DIAGNOSIS — R053 Chronic cough: Secondary | ICD-10-CM | POA: Insufficient documentation

## 2019-11-25 NOTE — Assessment & Plan Note (Signed)
Will refer to ENT for further evaluation as well as order CT low dose screen (history of smoker)

## 2019-11-25 NOTE — Assessment & Plan Note (Signed)
Pt given guidelines on how to slowly wean off cymbalta over few weeks time

## 2019-11-25 NOTE — Progress Notes (Signed)
Acute Office Visit  Subjective:    Patient ID: Madison Stein, female    DOB: 25-Jun-1951, 69 y.o.   MRN: [id number]  Chief Complaint  Patient presents with  . Cough  . Depression    HPI Patient is in today for cough  Pt states that she has had an irritating cough for over 9 months - she has tried claritin and was also put on singulair - states has not really helped Says she feels as though something is 'tickling her throat' all of the time which then causes the cough Does not feel like she has chest congestion, wheezing, or shortness of breath However pt is a former smoker  Pt with history of depression - she has been on cymbalta for over 10 years and currently on 60mg  bid - states she would like to taper off this medication because she states at times she feels she is 'not there' and thinks due to medication - says anxiety and depressive symptoms under control  Past Medical History:  Diagnosis Date  . Anxiety   . Arthritis   . Bronchitis 2013   Hx of  . Complication of anesthesia   . Depression    takes Cymbalta daily  . Depression   . Depression with anxiety   . Diabetes mellitus without complication ([place])    takes Metformin daily  . Diabetes mellitus without complication ([place])    Type II  . Family history of adverse reaction to anesthesia    patients mother and sister gets sick  . Family history of anesthesia complication    mom and sister get very sick  . Fibromyalgia   . History of bronchitis 63yrs ago  . History of shingles   . Hyperlipidemia    takes Fish Oil bid  . Hyperlipidemia   . Insomnia    has Ambien if needed  . Joint pain   . Nontoxic (diffuse) goiter   . Peripheral neuropathy    takes Lyrica daily  . Pneumonia 58yrs ago   hx of  . Pneumonia    hx of  . Polymyalgia rheumatica ([place])   . PONV (postoperative nausea and vomiting)   . Pure hyperglyceridemia   . Thyroid nodule   . Type 2 diabetes mellitus with diabetic polyneuropathy ([place])   .  Urgency of urination    Leakage wears pad    Past Surgical History:  Procedure Laterality Date  . BACK SURGERY  1989   lumbar disc  . CARPAL TUNNEL RELEASE Right 1985  . CATARACT EXTRACTION  2020  . CERVICAL FUSION  2009  . CHOLECYSTECTOMY  2007  . COLONOSCOPY    . ESOPHAGOGASTRODUODENOSCOPY    . EYE SURGERY Bilateral 2007   laser eye surgery   . HAMMER TOE SURGERY  2019  . HIP ARTHROPLASTY Bilateral   . REFRACTIVE SURGERY  2008  . TOTAL HIP ARTHROPLASTY Right 11/20/2013   Procedure: RIGHT TOTAL HIP ARTHROPLASTY;  Surgeon: Kerin Salen, MD;  Location: [facility];  Service: Orthopedics;  Laterality: Right;  . TOTAL HIP ARTHROPLASTY Left 08/30/2014   Procedure: TOTAL HIP ARTHROPLASTY;  Surgeon: Kerin Salen, MD;  Location: [facility];  Service: Orthopedics;  Laterality: Left;  . TOTAL KNEE ARTHROPLASTY Left 06/06/2015   Procedure: TOTAL KNEE ARTHROPLASTY;  Surgeon: Frederik Pear, MD;  Location: [facility];  Service: Orthopedics;  Laterality: Left;  . TOTAL KNEE ARTHROPLASTY Right 09/23/2017   Procedure: TOTAL KNEE ARTHROPLASTY;  Surgeon: Frederik Pear, MD;  Location: [facility];  Service: Orthopedics;  Laterality: Right;  . TUBAL LIGATION  1987  . TUMOR REMOVAL Right    Hand  . tumor removed from right hand  2011  . ulnar nerve release Left 1996    Family History  Problem Relation Age of Onset  . Alzheimer's disease Mother   . Kidney failure Father   . Diabetes Father   . Asthma Daughter   . Healthy Daughter     Social History   Socioeconomic History  . Marital status: Divorced    Spouse name: Not on file  . Number of children: 1  . Years of education: Not on file  . Highest education level: Some college, no degree  Occupational History  . Not on file  Tobacco Use  . Smoking status: Former Research scientist (life sciences)  . Smokeless tobacco: Never Used  . Tobacco comment: quit smoking in 1997  Substance and Sexual Activity  . Alcohol use: Not Currently    Comment: rare beer  . Drug use: No  . Sexual  activity: Never    Birth control/protection: Post-menopausal  Other Topics Concern  . Not on file  Social History Narrative   Lives alone   Left handed   Caffeine: at the most 2 cups of coffee/day      ** Merged History Encounter **       Social Determinants of Health   Financial Resource Strain:   . Difficulty of Paying Living Expenses:   Food Insecurity:   . Worried About Charity fundraiser in the Last Year:   . Arboriculturist in the Last Year:   Transportation Needs:   . Film/video editor (Medical):   Marland Kitchen Lack of Transportation (Non-Medical):   Physical Activity:   . Days of Exercise per Week:   . Minutes of Exercise per Session:   Stress:   . Feeling of Stress :   Social Connections:   . Frequency of Communication with Friends and Family:   . Frequency of Social Gatherings with Friends and Family:   . Attends Religious Services:   . Active Member of Clubs or Organizations:   . Attends Archivist Meetings:   Marland Kitchen Marital Status:   Intimate Partner Violence:   . Fear of Current or Ex-Partner:   . Emotionally Abused:   Marland Kitchen Physically Abused:   . Sexually Abused:      Current Outpatient Medications:  .  acetaminophen (TYLENOL 8 HOUR ARTHRITIS PAIN) 650 MG CR tablet, Take 650-1,300 mg by mouth 2 (two) times daily as needed for pain., Disp: , Rfl:  .  aspirin EC 81 MG tablet, Take 1 tablet (81 mg total) by mouth daily., Disp: 30 tablet, Rfl: 0 .  buPROPion (WELLBUTRIN XL) 300 MG 24 hr tablet, Take 1 tablet (300 mg total) by mouth daily., Disp: 30 tablet, Rfl: 3 .  diclofenac sodium (VOLTAREN) 1 % GEL, Apply 3 gm to 3 large joints up to 3 times a day.Dispense 3 tubes with 3 refills., Disp: 3 Tube, Rfl: 1 .  DULoxetine (CYMBALTA) 60 MG capsule, Take 60 mg by mouth 2 (two) times daily., Disp: , Rfl:  .  FREESTYLE LITE test strip, CHECK BLOOD SUGAR EVERY DAY, Disp: , Rfl:  .  gabapentin (NEURONTIN) 300 MG capsule, TAKE ONE CAPSULE BY MOUTH EVERY MORNING AND 2  CAPSULES AT BEDTIME, Disp: 270 capsule, Rfl: 1 .  glimepiride (AMARYL) 4 MG tablet, TAKE 1 TABLET BY MOUTH EVERY DAY, Disp: 90 tablet, Rfl: 0 .  lisinopril (ZESTRIL) 2.5 MG tablet, TAKE 1 TABLET BY MOUTH ONCE DAILY FOR RENAL PROTECTION, Disp: 90 tablet, Rfl: 1 .  methocarbamol (ROBAXIN) 750 MG tablet, Take 1 tablet (750 mg total) by mouth 4 (four) times daily as needed for muscle spasms., Disp: 60 tablet, Rfl: 0 .  montelukast (SINGULAIR) 10 MG tablet, Take 1 tablet (10 mg total) by mouth at bedtime., Disp: 90 tablet, Rfl: 0 .  Multiple Vitamin (MULTIVITAMIN WITH MINERALS) TABS tablet, Take 1 tablet by mouth daily., Disp: , Rfl:  .  rizatriptan (MAXALT-MLT) 10 MG disintegrating tablet, DISSOLVE 1 TABLET ON TONGUE, MAY REPEAT AT 2 HOUR INTERVALS IF NEEDED. MAXIMUM OF 3 TABLETS IN 24 HOURS, Disp: 18 tablet, Rfl: 2 .  SitaGLIPtin-MetFORMIN HCl (JANUMET XR) [phone] MG TB24, Take 1 tablet by mouth daily with supper., Disp: , Rfl:  .  VASCEPA 1 g capsule, TAKE 2 CAPSULES BY MOUTH TWICE DAILY WITH FOOD, Disp: 360 capsule, Rfl: 0   Allergies  Allergen Reactions  . Morphine And Related Nausea And Vomiting    "just don't tolerate it well"    CONSTITUTIONAL: Negative for chills, fatigue, fever, unintentional weight gain and unintentional weight loss.  E/N/T: see HPI CARDIOVASCULAR: Negative for chest pain, dizziness, palpitations and pedal edema.  RESPIRATORY: see HPI GASTROINTESTINAL: Negative for abdominal pain, acid reflux symptoms, constipation, diarrhea, nausea and vomiting.   NEUROLOGICAL: Negative for dizziness and headaches.  PSYCHIATRIC:see HPI        Objective:    PHYSICAL EXAM:   VS: BP 132/62   Pulse (!) 103   Temp (!) 97.1 F (36.2 C)   Resp 16   Wt 226 lb (102.5 kg)   SpO2 97%   BMI 40.03 kg/m   GEN: Well nourished, well developed, in no acute distress  Oropharynx - normal mucosa, palate, and posterior pharynx Neck: no JVD or masses - no thyromegaly Cardiac: RRR; no  murmurs, rubs, or gallops,no edema - no significant varicosities Respiratory:  normal respiratory rate and pattern with no distress - normal breath sounds with no rales, rhonchi, wheezes or rubs Psych: euthymic mood, appropriate affect and demeanor   Wt Readings from Last 3 Encounters:  11/25/19 226 lb (102.5 kg)  11/06/19 226 lb (102.5 kg)  09/09/19 230 lb (104.3 kg)    Health Maintenance Due  Topic Date Due  . Hepatitis C Screening  Never done  . OPHTHALMOLOGY EXAM  Never done  . COVID-19 Vaccine (1) Never done  . TETANUS/TDAP  Never done  . MAMMOGRAM  Never done  . COLONOSCOPY  Never done  . DEXA SCAN  Never done  . PNA vac Low Risk Adult (1 of 2 - PCV13) Never done    There are no preventive care reminders to display for this patient.        Assessment & Plan:   Problem List Items Addressed This Visit      Other   Depression with anxiety    Pt given guidelines on how to slowly wean off cymbalta over few weeks time      Chronic cough - Primary    Will refer to ENT for further evaluation as well as order CT low dose screen (history of smoker)      Relevant Orders   Ambulatory referral to ENT   CT CHEST LUNG CA SCREEN LOW DOSE W/O CM       No orders of the defined types were placed in this encounter.    SARA R DAVIS, PA-C

## 2019-12-08 ENCOUNTER — Other Ambulatory Visit: Payer: Self-pay

## 2019-12-08 DIAGNOSIS — R053 Chronic cough: Secondary | ICD-10-CM

## 2019-12-08 DIAGNOSIS — R0602 Shortness of breath: Secondary | ICD-10-CM

## 2019-12-11 DIAGNOSIS — R05 Cough: Secondary | ICD-10-CM | POA: Diagnosis not present

## 2019-12-16 ENCOUNTER — Encounter: Payer: Self-pay | Admitting: Physician Assistant

## 2019-12-16 ENCOUNTER — Other Ambulatory Visit: Payer: Self-pay

## 2019-12-16 ENCOUNTER — Ambulatory Visit (INDEPENDENT_AMBULATORY_CARE_PROVIDER_SITE_OTHER): Payer: PPO | Admitting: Physician Assistant

## 2019-12-16 ENCOUNTER — Other Ambulatory Visit: Payer: Self-pay | Admitting: Physician Assistant

## 2019-12-16 VITALS — BP 124/68 | HR 110 | Temp 96.4°F | Ht 63.0 in | Wt 230.0 lb

## 2019-12-16 DIAGNOSIS — R053 Chronic cough: Secondary | ICD-10-CM

## 2019-12-16 DIAGNOSIS — R05 Cough: Secondary | ICD-10-CM | POA: Diagnosis not present

## 2019-12-16 MED ORDER — OMEPRAZOLE 40 MG PO CPDR
40.0000 mg | DELAYED_RELEASE_CAPSULE | Freq: Every day | ORAL | 1 refills | Status: DC
Start: 2019-12-16 — End: 2019-12-16

## 2019-12-16 MED ORDER — ALBUTEROL SULFATE HFA 108 (90 BASE) MCG/ACT IN AERS
2.0000 | INHALATION_SPRAY | Freq: Four times a day (QID) | RESPIRATORY_TRACT | 1 refills | Status: DC | PRN
Start: 2019-12-16 — End: 2019-12-16

## 2019-12-16 NOTE — Assessment & Plan Note (Addendum)
Continue singulair as directed rx for albuterol inhaler given to use as directed rx for omeprazole 40mg  qd Stop lisinopril Follow up with ENT as scheduled

## 2019-12-16 NOTE — Progress Notes (Signed)
Acute Office Visit  Subjective:    Patient ID: Madison Stein, female    DOB: 1950-12-07, 69 y.o.   MRN: [id number]  Chief Complaint  Patient presents with  . Cough    HPI Patient is in today for cough  Pt states that she has had an irritating cough for over 9 months - she has tried claritin and was also put on singulair - states has not really helped Says she feels as though something is 'tickling her throat' all of the time which then causes the cough Does not feel like she has chest congestion or shortness of breath however now she states that after she coughs for long periods of time she may have some wheezing She just completed a chest CT which was normal Of note she is on lisinopril 2.5mg  qd but that was started a few months after her cough began She denies any type of GERD symptoms besides the 'tickle in her throat' An appt with ENT has been made but it is not until 01/12/20 Past Medical History:  Diagnosis Date  . Anxiety   . Arthritis   . Bronchitis 2013   Hx of  . Complication of anesthesia   . Depression    takes Cymbalta daily  . Depression   . Depression with anxiety   . Diabetes mellitus without complication ([place])    takes Metformin daily  . Diabetes mellitus without complication ([place])    Type II  . Family history of adverse reaction to anesthesia    patients mother and sister gets sick  . Family history of anesthesia complication    mom and sister get very sick  . Fibromyalgia   . History of bronchitis 57yrs ago  . History of shingles   . Hyperlipidemia    takes Fish Oil bid  . Hyperlipidemia   . Insomnia    has Ambien if needed  . Joint pain   . Nontoxic (diffuse) goiter   . Peripheral neuropathy    takes Lyrica daily  . Pneumonia 12yrs ago   hx of  . Pneumonia    hx of  . Polymyalgia rheumatica ([place])   . PONV (postoperative nausea and vomiting)   . Pure hyperglyceridemia   . Thyroid nodule   . Type 2 diabetes mellitus with diabetic  polyneuropathy ([place])   . Urgency of urination    Leakage wears pad    Past Surgical History:  Procedure Laterality Date  . BACK SURGERY  1989   lumbar disc  . CARPAL TUNNEL RELEASE Right 1985  . CATARACT EXTRACTION  2020  . CERVICAL FUSION  2009  . CHOLECYSTECTOMY  2007  . COLONOSCOPY    . ESOPHAGOGASTRODUODENOSCOPY    . EYE SURGERY Bilateral 2007   laser eye surgery   . HAMMER TOE SURGERY  2019  . HIP ARTHROPLASTY Bilateral   . REFRACTIVE SURGERY  2008  . TOTAL HIP ARTHROPLASTY Right 11/20/2013   Procedure: RIGHT TOTAL HIP ARTHROPLASTY;  Surgeon: Kerin Salen, MD;  Location: [facility];  Service: Orthopedics;  Laterality: Right;  . TOTAL HIP ARTHROPLASTY Left 08/30/2014   Procedure: TOTAL HIP ARTHROPLASTY;  Surgeon: Kerin Salen, MD;  Location: [facility];  Service: Orthopedics;  Laterality: Left;  . TOTAL KNEE ARTHROPLASTY Left 06/06/2015   Procedure: TOTAL KNEE ARTHROPLASTY;  Surgeon: Frederik Pear, MD;  Location: [facility];  Service: Orthopedics;  Laterality: Left;  . TOTAL KNEE ARTHROPLASTY Right 09/23/2017   Procedure: TOTAL KNEE ARTHROPLASTY;  Surgeon: Mayer Camel,  Pilar Plate, MD;  Location: [facility];  Service: Orthopedics;  Laterality: Right;  . TUBAL LIGATION  1987  . TUMOR REMOVAL Right    Hand  . tumor removed from right hand  2011  . ulnar nerve release Left 1996    Family History  Problem Relation Age of Onset  . Alzheimer's disease Mother   . Kidney failure Father   . Diabetes Father   . Asthma Daughter   . Healthy Daughter     Social History   Socioeconomic History  . Marital status: Divorced    Spouse name: Not on file  . Number of children: 1  . Years of education: Not on file  . Highest education level: Some college, no degree  Occupational History  . Not on file  Tobacco Use  . Smoking status: Former Research scientist (life sciences)  . Smokeless tobacco: Never Used  . Tobacco comment: quit smoking in 1997  Substance and Sexual Activity  . Alcohol use: Not Currently    Comment: rare beer  .  Drug use: No  . Sexual activity: Never    Birth control/protection: Post-menopausal  Other Topics Concern  . Not on file  Social History Narrative   Lives alone   Left handed   Caffeine: at the most 2 cups of coffee/day      ** Merged History Encounter **       Social Determinants of Health   Financial Resource Strain:   . Difficulty of Paying Living Expenses:   Food Insecurity:   . Worried About Charity fundraiser in the Last Year:   . Arboriculturist in the Last Year:   Transportation Needs:   . Film/video editor (Medical):   Marland Kitchen Lack of Transportation (Non-Medical):   Physical Activity:   . Days of Exercise per Week:   . Minutes of Exercise per Session:   Stress:   . Feeling of Stress :   Social Connections:   . Frequency of Communication with Friends and Family:   . Frequency of Social Gatherings with Friends and Family:   . Attends Religious Services:   . Active Member of Clubs or Organizations:   . Attends Archivist Meetings:   Marland Kitchen Marital Status:   Intimate Partner Violence:   . Fear of Current or Ex-Partner:   . Emotionally Abused:   Marland Kitchen Physically Abused:   . Sexually Abused:      Current Outpatient Medications:  .  acetaminophen (TYLENOL 8 HOUR ARTHRITIS PAIN) 650 MG CR tablet, Take 650-1,300 mg by mouth 2 (two) times daily as needed for pain., Disp: , Rfl:  .  aspirin EC 81 MG tablet, Take 1 tablet (81 mg total) by mouth daily., Disp: 30 tablet, Rfl: 0 .  buPROPion (WELLBUTRIN XL) 300 MG 24 hr tablet, Take 1 tablet (300 mg total) by mouth daily., Disp: 30 tablet, Rfl: 3 .  diclofenac sodium (VOLTAREN) 1 % GEL, Apply 3 gm to 3 large joints up to 3 times a day.Dispense 3 tubes with 3 refills., Disp: 3 Tube, Rfl: 1 .  DULoxetine (CYMBALTA) 60 MG capsule, Take 60 mg by mouth 2 (two) times daily., Disp: , Rfl:  .  FREESTYLE LITE test strip, CHECK BLOOD SUGAR EVERY DAY, Disp: , Rfl:  .  gabapentin (NEURONTIN) 300 MG capsule, TAKE ONE CAPSULE BY MOUTH  EVERY MORNING AND 2 CAPSULES AT BEDTIME, Disp: 270 capsule, Rfl: 1 .  glimepiride (AMARYL) 4 MG tablet, TAKE 1 TABLET BY MOUTH EVERY DAY,  Disp: 90 tablet, Rfl: 0 .  lisinopril (ZESTRIL) 2.5 MG tablet, TAKE 1 TABLET BY MOUTH ONCE DAILY FOR RENAL PROTECTION, Disp: 90 tablet, Rfl: 1 .  methocarbamol (ROBAXIN) 750 MG tablet, Take 1 tablet (750 mg total) by mouth 4 (four) times daily as needed for muscle spasms., Disp: 60 tablet, Rfl: 0 .  montelukast (SINGULAIR) 10 MG tablet, Take 1 tablet (10 mg total) by mouth at bedtime., Disp: 90 tablet, Rfl: 0 .  Multiple Vitamin (MULTIVITAMIN WITH MINERALS) TABS tablet, Take 1 tablet by mouth daily., Disp: , Rfl:  .  rizatriptan (MAXALT-MLT) 10 MG disintegrating tablet, DISSOLVE 1 TABLET ON TONGUE, MAY REPEAT AT 2 HOUR INTERVALS IF NEEDED. MAXIMUM OF 3 TABLETS IN 24 HOURS, Disp: 18 tablet, Rfl: 2 .  SitaGLIPtin-MetFORMIN HCl (JANUMET XR) [phone] MG TB24, Take 1 tablet by mouth daily with supper., Disp: , Rfl:  .  VASCEPA 1 g capsule, TAKE 2 CAPSULES BY MOUTH TWICE DAILY WITH FOOD, Disp: 360 capsule, Rfl: 0   Allergies  Allergen Reactions  . Morphine And Related Nausea And Vomiting    "just don't tolerate it well"    CONSTITUTIONAL: Negative for chills, fatigue, fever, unintentional weight gain and unintentional weight loss.  E/N/T: see HPI CARDIOVASCULAR: Negative for chest pain, dizziness, palpitations and pedal edema.  RESPIRATORY: see HPI GASTROINTESTINAL: Negative for abdominal pain, acid reflux symptoms, constipation, diarrhea, nausea and vomiting.          Objective:    PHYSICAL EXAM:   VS: BP 124/68 (BP Location: Left Arm, Patient Position: Sitting)   Pulse (!) 110   Temp (!) 96.4 F (35.8 C) (Temporal)   Ht 5\' 3"  (1.6 m)   Wt 230 lb (104.3 kg)   SpO2 94%   BMI 40.74 kg/m   GEN: Well nourished, well developed, in no acute distress  Oropharynx - normal mucosa, palate, and posterior pharynx Cardiac: RRR; no murmurs, rubs, or  gallops,no edema - no significant varicosities Respiratory:  normal respiratory rate and pattern with no distress - normal breath sounds with no rales, rhonchi, wheezes or rubs Psych: euthymic mood, appropriate affect and demeanor   Wt Readings from Last 3 Encounters:  12/16/19 230 lb (104.3 kg)  11/25/19 226 lb (102.5 kg)  11/06/19 226 lb (102.5 kg)    Health Maintenance Due  Topic Date Due  . Hepatitis C Screening  Never done  . OPHTHALMOLOGY EXAM  Never done  . COVID-19 Vaccine (1) Never done  . TETANUS/TDAP  Never done  . MAMMOGRAM  Never done  . COLONOSCOPY  Never done  . DEXA SCAN  Never done  . PNA vac Low Risk Adult (1 of 2 - PCV13) Never done    There are no preventive care reminders to display for this patient.        Assessment & Plan:   Problem List Items Addressed This Visit    None       No orders of the defined types were placed in this encounter.    SARA R Saaya Procell, PA-C

## 2019-12-16 NOTE — Addendum Note (Signed)
Addended byMarge Duncans on: 12/16/2019 02:11 PM   Modules accepted: Orders

## 2019-12-23 ENCOUNTER — Other Ambulatory Visit: Payer: Self-pay

## 2019-12-23 MED ORDER — ICOSAPENT ETHYL 1 G PO CAPS: ORAL_CAPSULE | ORAL | 0 refills | Status: DC

## 2019-12-31 DIAGNOSIS — H16223 Keratoconjunctivitis sicca, not specified as Sjogren's, bilateral: Secondary | ICD-10-CM | POA: Diagnosis not present

## 2019-12-31 DIAGNOSIS — H26493 Other secondary cataract, bilateral: Secondary | ICD-10-CM | POA: Diagnosis not present

## 2019-12-31 DIAGNOSIS — E113291 Type 2 diabetes mellitus with mild nonproliferative diabetic retinopathy without macular edema, right eye: Secondary | ICD-10-CM | POA: Diagnosis not present

## 2019-12-31 DIAGNOSIS — H209 Unspecified iridocyclitis: Secondary | ICD-10-CM | POA: Diagnosis not present

## 2019-12-31 DIAGNOSIS — Z961 Presence of intraocular lens: Secondary | ICD-10-CM | POA: Diagnosis not present

## 2019-12-31 DIAGNOSIS — Z7984 Long term (current) use of oral hypoglycemic drugs: Secondary | ICD-10-CM | POA: Diagnosis not present

## 2019-12-31 DIAGNOSIS — H43391 Other vitreous opacities, right eye: Secondary | ICD-10-CM | POA: Diagnosis not present

## 2020-01-08 ENCOUNTER — Telehealth: Payer: Self-pay

## 2020-01-08 NOTE — Telephone Encounter (Signed)
Patient mad aware.

## 2020-01-08 NOTE — Telephone Encounter (Signed)
Guidelines now are no prophylatic antibiotics are recommended for patients for dental procedures with prosthetic joints (except for 8-12 weeks after the surgery)

## 2020-01-08 NOTE — Telephone Encounter (Signed)
Patient called in and left message, Wanting amoxicillan called in for appointment with dentist this week. States [place] PA gave her it to prevent infection whenever she went to dentist.

## 2020-01-12 ENCOUNTER — Other Ambulatory Visit: Payer: Self-pay | Admitting: Physician Assistant

## 2020-01-12 DIAGNOSIS — H26493 Other secondary cataract, bilateral: Secondary | ICD-10-CM | POA: Diagnosis not present

## 2020-01-12 DIAGNOSIS — E113291 Type 2 diabetes mellitus with mild nonproliferative diabetic retinopathy without macular edema, right eye: Secondary | ICD-10-CM | POA: Diagnosis not present

## 2020-01-12 DIAGNOSIS — H209 Unspecified iridocyclitis: Secondary | ICD-10-CM | POA: Diagnosis not present

## 2020-01-12 DIAGNOSIS — Z961 Presence of intraocular lens: Secondary | ICD-10-CM | POA: Diagnosis not present

## 2020-01-12 DIAGNOSIS — H43391 Other vitreous opacities, right eye: Secondary | ICD-10-CM | POA: Diagnosis not present

## 2020-01-12 DIAGNOSIS — Z7984 Long term (current) use of oral hypoglycemic drugs: Secondary | ICD-10-CM | POA: Diagnosis not present

## 2020-01-12 DIAGNOSIS — H16223 Keratoconjunctivitis sicca, not specified as Sjogren's, bilateral: Secondary | ICD-10-CM | POA: Diagnosis not present

## 2020-01-21 DIAGNOSIS — L821 Other seborrheic keratosis: Secondary | ICD-10-CM | POA: Diagnosis not present

## 2020-01-21 DIAGNOSIS — D1801 Hemangioma of skin and subcutaneous tissue: Secondary | ICD-10-CM | POA: Diagnosis not present

## 2020-02-08 ENCOUNTER — Ambulatory Visit: Payer: PPO | Admitting: Physician Assistant

## 2020-03-01 ENCOUNTER — Telehealth: Payer: Self-pay | Admitting: Neurology

## 2020-03-01 NOTE — Telephone Encounter (Signed)
Would you please call patient and see if they would like to schedule tomorrow as video visit due to the delta variant increase? We are seeing even vaccinated people infected as they know.  Or they can  Reschedule for office visit for a few months in the future with me to give everyone a chance to get the booster shot and for the delta variant to hopefully decrease. Either works thanks

## 2020-03-01 NOTE — Telephone Encounter (Signed)
Would you please call patient and see if they would like to schedule tomorrow as video visit due to the delta variant increase? We are seeing even vaccinated people infected as they know.  Or they can  Reschedule for office visit for a few months in the future with Janett Billow to give everyone a chance to get the booster shot and for the delta variant to hopefully decrease. thanks

## 2020-03-02 ENCOUNTER — Telehealth: Payer: PPO | Admitting: Neurology

## 2020-03-03 ENCOUNTER — Other Ambulatory Visit: Payer: Self-pay

## 2020-03-03 ENCOUNTER — Ambulatory Visit: Payer: PPO | Admitting: Adult Health

## 2020-03-03 VITALS — BP 122/80 | HR 90 | Ht 62.0 in | Wt 213.0 lb

## 2020-03-03 DIAGNOSIS — M542 Cervicalgia: Secondary | ICD-10-CM

## 2020-03-03 DIAGNOSIS — I679 Cerebrovascular disease, unspecified: Secondary | ICD-10-CM

## 2020-03-03 DIAGNOSIS — R9082 White matter disease, unspecified: Secondary | ICD-10-CM | POA: Diagnosis not present

## 2020-03-03 NOTE — Patient Instructions (Signed)
Your Plan:  Continue to monitor symptoms BP goal <130/90 HbgA1c <6.5% LDL < 70  If your symptoms worsen or you develop new symptoms please let us know.   Thank you for coming to see [place] at [facility]. I hope we have been able to provide you high quality care today.  You may receive a patient satisfaction survey over the next few weeks. We would appreciate your feedback and comments so that we may continue to improve ourselves and the health of our patients.

## 2020-03-03 NOTE — Progress Notes (Addendum)
PATIENT: DEANA KROCK DOB: 1950/09/11  REASON FOR VISIT: follow up HISTORY FROM: patient  HISTORY OF PRESENT ILLNESS: Today 03/03/20:  Ms. Mcwright is a 69 year old female with a history of white matter changes on MRI and neck pain.  She returns today for follow-up.  Overall she feels that she is doing much better.  Reports that her tremor has pretty much resolved.  She states on occasion she will have a flareup of neck pain but is not consistent.  Denies any strokelike symptoms.  She returns today for an evaluation.  HISTORY  MIYAH HAMPSHIRE is a 68 y.o. female here as requested by Carlton Adam, PA-C for white matter changes on MRI brain in the pons found incidentally on MRI cervical spine. PMHx peripheral neuropathy, hyperlipidemia, fibromyalgia, diabetes, depression, anxiety.No cognitive issues or memory changes, she has some shaking on the left side and she attributes it to hr neck, she has a proble with her legs her foot jerks but it doesn't affect walking, she uses a cane because of her knee replacements and hip replacement and low back pain she has a lot of arthritic changes currently hurting very badly. She started smoking in 1996 and quit same year not smoking a lot, no exposure to smoke or toxins, she has HLD and the triglycerides are elevated. Her pcp is with [facility], she has diabetes and it is 150-160s and hgba1c is 6.6. Mom and daughter have tremors, her tremor started years ago and worsening.Most of her pain is in her neck. No other focal neurologic deficits, associated symptoms, inciting events or modifiable factors.  Reviewed notes, labs and imaging from outside physicians, which showed:   I reviewed MRI of the cervical spine report in addition to personally reviewing images myself and with patient and I do agree there appears to be some white matter changes in the pons likely chronic microvascular ischemic changes but no stroke.  Cbc elevated glucose 227, cbc  elevated wbcs 12.4, 08/17/2019 otherwise unremarkable   REVIEW OF SYSTEMS: Out of a complete 14 system review of symptoms, the patient complains only of the following symptoms, and all other reviewed systems are negative.  See HPI  ALLERGIES: Allergies  Allergen Reactions  . Morphine And Related Nausea And Vomiting    "just don't tolerate it well"    HOME MEDICATIONS: Outpatient Medications Prior to Visit  Medication Sig Dispense Refill  . acetaminophen (TYLENOL 8 HOUR ARTHRITIS PAIN) 650 MG CR tablet Take 650-1,300 mg by mouth 2 (two) times daily as needed for pain.    Marland Kitchen albuterol (VENTOLIN HFA) 108 (90 Base) MCG/ACT inhaler INHALE 2 PUFFS INTO THE LUNGS EVERY 6 HOURS AS NEEDED FOR WHEEZING OR SHORTNESS OF BREATH 25.5 g 0  . aspirin EC 81 MG tablet Take 1 tablet (81 mg total) by mouth daily. 30 tablet 0  . buPROPion (WELLBUTRIN XL) 300 MG 24 hr tablet Take 1 tablet (300 mg total) by mouth daily. 30 tablet 3  . diclofenac sodium (VOLTAREN) 1 % GEL Apply 3 gm to 3 large joints up to 3 times a day.Dispense 3 tubes with 3 refills. 3 Tube 1  . DULoxetine (CYMBALTA) 60 MG capsule Take 60 mg by mouth 2 (two) times daily.    Marland Kitchen FREESTYLE LITE test strip CHECK BLOOD SUGAR EVERY DAY    . gabapentin (NEURONTIN) 300 MG capsule TAKE ONE CAPSULE BY MOUTH EVERY MORNING AND 2 CAPSULES AT BEDTIME 270 capsule 1  . glimepiride (AMARYL) 4 MG  tablet TAKE 1 TABLET BY MOUTH EVERY DAY 90 tablet 0  . icosapent Ethyl (VASCEPA) 1 g capsule TAKE 2 CAPSULES BY MOUTH TWICE DAILY WITH FOOD 360 capsule 0  . methocarbamol (ROBAXIN) 750 MG tablet Take 1 tablet (750 mg total) by mouth 4 (four) times daily as needed for muscle spasms. 60 tablet 0  . montelukast (SINGULAIR) 10 MG tablet Take 1 tablet (10 mg total) by mouth at bedtime. 90 tablet 0  . Multiple Vitamin (MULTIVITAMIN WITH MINERALS) TABS tablet Take 1 tablet by mouth daily.    Marland Kitchen omeprazole (PRILOSEC) 40 MG capsule TAKE 1 CAPSULE(40 MG) BY MOUTH DAILY 90 capsule  0  . rizatriptan (MAXALT-MLT) 10 MG disintegrating tablet DISSOLVE 1 TABLET ON TONGUE, MAY REPEAT AT 2 HOUR INTERVALS IF NEEDED. MAXIMUM OF 3 TABLETS IN 24 HOURS 18 tablet 2  . SitaGLIPtin-MetFORMIN HCl (JANUMET XR) [phone] MG TB24 Take 1 tablet by mouth daily with supper.     No facility-administered medications prior to visit.    PAST MEDICAL HISTORY: Past Medical History:  Diagnosis Date  . Anxiety   . Arthritis   . Bronchitis 2013   Hx of  . Complication of anesthesia   . Depression    takes Cymbalta daily  . Depression   . Depression with anxiety   . Diabetes mellitus without complication ([place])    takes Metformin daily  . Diabetes mellitus without complication ([place])    Type II  . Family history of adverse reaction to anesthesia    patients mother and sister gets sick  . Family history of anesthesia complication    mom and sister get very sick  . Fibromyalgia   . History of bronchitis 62yrs ago  . History of shingles   . Hyperlipidemia    takes Fish Oil bid  . Hyperlipidemia   . Insomnia    has Ambien if needed  . Joint pain   . Nontoxic (diffuse) goiter   . Peripheral neuropathy    takes Lyrica daily  . Pneumonia 40yrs ago   hx of  . Pneumonia    hx of  . Polymyalgia rheumatica ([place])   . PONV (postoperative nausea and vomiting)   . Pure hyperglyceridemia   . Thyroid nodule   . Type 2 diabetes mellitus with diabetic polyneuropathy ([place])   . Urgency of urination    Leakage wears pad    PAST SURGICAL HISTORY: Past Surgical History:  Procedure Laterality Date  . BACK SURGERY  1989   lumbar disc  . CARPAL TUNNEL RELEASE Right 1985  . CATARACT EXTRACTION  2020  . CERVICAL FUSION  2009  . CHOLECYSTECTOMY  2007  . COLONOSCOPY    . ESOPHAGOGASTRODUODENOSCOPY    . EYE SURGERY Bilateral 2007   laser eye surgery   . HAMMER TOE SURGERY  2019  . HIP ARTHROPLASTY Bilateral   . REFRACTIVE SURGERY  2008  . TOTAL HIP ARTHROPLASTY Right 11/20/2013   Procedure:  RIGHT TOTAL HIP ARTHROPLASTY;  Surgeon: Kerin Salen, MD;  Location: [facility];  Service: Orthopedics;  Laterality: Right;  . TOTAL HIP ARTHROPLASTY Left 08/30/2014   Procedure: TOTAL HIP ARTHROPLASTY;  Surgeon: Kerin Salen, MD;  Location: [facility];  Service: Orthopedics;  Laterality: Left;  . TOTAL KNEE ARTHROPLASTY Left 06/06/2015   Procedure: TOTAL KNEE ARTHROPLASTY;  Surgeon: Frederik Pear, MD;  Location: [facility];  Service: Orthopedics;  Laterality: Left;  . TOTAL KNEE ARTHROPLASTY Right 09/23/2017   Procedure: TOTAL KNEE ARTHROPLASTY;  Surgeon: Mayer Camel,  Pilar Plate, MD;  Location: [facility];  Service: Orthopedics;  Laterality: Right;  . TUBAL LIGATION  1987  . TUMOR REMOVAL Right    Hand  . tumor removed from right hand  2011  . ulnar nerve release Left 1996    FAMILY HISTORY: Family History  Problem Relation Age of Onset  . Alzheimer's disease Mother   . Kidney failure Father   . Diabetes Father   . Asthma Daughter   . Healthy Daughter     SOCIAL HISTORY: Social History   Socioeconomic History  . Marital status: Divorced    Spouse name: Not on file  . Number of children: 1  . Years of education: Not on file  . Highest education level: Some college, no degree  Occupational History  . Not on file  Tobacco Use  . Smoking status: Former Research scientist (life sciences)  . Smokeless tobacco: Never Used  . Tobacco comment: quit smoking in 1997  Vaping Use  . Vaping Use: Never used  Substance and Sexual Activity  . Alcohol use: Not Currently    Comment: rare beer  . Drug use: No  . Sexual activity: Never    Birth control/protection: Post-menopausal  Other Topics Concern  . Not on file  Social History Narrative   Lives alone   Left handed   Caffeine: at the most 2 cups of coffee/day      ** Merged History Encounter **       Social Determinants of Health   Financial Resource Strain:   . Difficulty of Paying Living Expenses: Not on file  Food Insecurity:   . Worried About Charity fundraiser in the Last  Year: Not on file  . Ran Out of Food in the Last Year: Not on file  Transportation Needs:   . Lack of Transportation (Medical): Not on file  . Lack of Transportation (Non-Medical): Not on file  Physical Activity:   . Days of Exercise per Week: Not on file  . Minutes of Exercise per Session: Not on file  Stress:   . Feeling of Stress : Not on file  Social Connections:   . Frequency of Communication with Friends and Family: Not on file  . Frequency of Social Gatherings with Friends and Family: Not on file  . Attends Religious Services: Not on file  . Active Member of Clubs or Organizations: Not on file  . Attends Archivist Meetings: Not on file  . Marital Status: Not on file  Intimate Partner Violence:   . Fear of Current or Ex-Partner: Not on file  . Emotionally Abused: Not on file  . Physically Abused: Not on file  . Sexually Abused: Not on file      PHYSICAL EXAM  Vitals:   03/03/20 1424  BP: 122/80  Pulse: 90  Weight: 213 lb (96.6 kg)  Height: 5\' 2"  (1.575 m)   Body mass index is 38.96 kg/m.  Generalized: Well developed, in no acute distress   Neurological examination  Mentation: Alert oriented to time, place, history taking. Follows all commands speech and language fluent Cranial nerve II-XII: Pupils were equal round reactive to light. Extraocular movements were full, visual field were full on confrontational test.Head turning and shoulder shrug  were normal and symmetric. Motor: The motor testing reveals 5 over 5 strength of all 4 extremities. Good symmetric motor tone is noted throughout.  Sensory: Sensory testing is intact to soft touch on all 4 extremities. No evidence of extinction is noted.  Coordination:  Cerebellar testing reveals good finger-nose-finger and heel-to-shin bilaterally.  Gait and station: Gait is normal.  Reflexes: Deep tendon reflexes are symmetric and normal bilaterally.   DIAGNOSTIC DATA (LABS, IMAGING, TESTING) - I reviewed  patient records, labs, notes, testing and imaging myself where available.  Lab Results  Component Value Date   WBC 12.0 (H) 11/06/2019   HGB 12.8 11/06/2019   HCT 40.4 11/06/2019   MCV 82 11/06/2019   PLT 371 11/06/2019      Component Value Date/Time   NA 138 11/06/2019 0850   K 5.1 11/06/2019 0850   CL 99 11/06/2019 0850   CO2 23 11/06/2019 0850   GLUCOSE 176 (H) 11/06/2019 0850   GLUCOSE 153 (H) 09/24/2017 0738   BUN 19 11/06/2019 0850   CREATININE 1.06 (H) 11/06/2019 0850   CALCIUM 9.7 11/06/2019 0850   PROT 6.8 11/06/2019 0850   ALBUMIN 4.1 11/06/2019 0850   AST 16 11/06/2019 0850   ALT 12 11/06/2019 0850   ALKPHOS 95 11/06/2019 0850   BILITOT 0.2 11/06/2019 0850   GFRNONAA 55 (L) 11/06/2019 0850   GFRAA 63 11/06/2019 0850   Lab Results  Component Value Date   CHOL 169 11/06/2019   HDL 54 11/06/2019   LDLCALC 81 11/06/2019   TRIG 207 (H) 11/06/2019   CHOLHDL 3.1 11/06/2019   Lab Results  Component Value Date   HGBA1C 6.9 (H) 11/06/2019   No results found for: VITAMINB12 Lab Results  Component Value Date   TSH 1.220 11/06/2019      ASSESSMENT AND PLAN 69 y.o. year old female  has a past medical history of Anxiety, Arthritis, Bronchitis (9892), Complication of anesthesia, Depression, Depression, Depression with anxiety, Diabetes mellitus without complication ([place]), Diabetes mellitus without complication ([place]), Family history of adverse reaction to anesthesia, Family history of anesthesia complication, Fibromyalgia, History of bronchitis (30yrs ago), History of shingles, Hyperlipidemia, Hyperlipidemia, Insomnia, Joint pain, Nontoxic (diffuse) goiter, Peripheral neuropathy, Pneumonia (32yrs ago), Pneumonia, Polymyalgia rheumatica ([place]), PONV (postoperative nausea and vomiting), Pure hyperglyceridemia, Thyroid nodule, Type 2 diabetes mellitus with diabetic polyneuropathy ([place]), and Urgency of urination. here with:  Cerebrovascular disease White matter changes on  MRI Neck pain   Advised to continue monitoring symptoms  Continue aspirin  Chemistry control of blood pressure with goal less than 130/90, hemoglobin A1c less than 6.5% and LDL less than 70  Advised if symptoms worsen or she develops new symptoms she should let us know  Follow-up on an as-needed basis   I spent 20 minutes of face-to-face and non-face-to-face time with patient.  This included previsit chart review, lab review, study review, order entry, electronic health record documentation, patient education.  Ward Givens, MSN, NP-C 03/03/2020, 2:30 PM [facility] [address] [place], [place] [address] [phone]  Made any corrections needed, and agree with history, physical, neuro exam,assessment and plan as stated.     Sarina Ill, MD [facility]

## 2020-03-11 ENCOUNTER — Other Ambulatory Visit: Payer: Self-pay | Admitting: Family Medicine

## 2020-03-12 ENCOUNTER — Other Ambulatory Visit: Payer: Self-pay | Admitting: Physician Assistant

## 2020-03-15 ENCOUNTER — Other Ambulatory Visit: Payer: Self-pay | Admitting: Physician Assistant

## 2020-03-22 ENCOUNTER — Ambulatory Visit: Payer: PPO | Admitting: Physician Assistant

## 2020-03-30 ENCOUNTER — Other Ambulatory Visit: Payer: Self-pay

## 2020-03-30 ENCOUNTER — Ambulatory Visit (INDEPENDENT_AMBULATORY_CARE_PROVIDER_SITE_OTHER): Payer: PPO | Admitting: Physician Assistant

## 2020-03-30 ENCOUNTER — Encounter: Payer: Self-pay | Admitting: Physician Assistant

## 2020-03-30 VITALS — BP 118/72 | HR 92 | Temp 97.8°F | Ht 62.0 in | Wt 209.0 lb

## 2020-03-30 DIAGNOSIS — E782 Mixed hyperlipidemia: Secondary | ICD-10-CM | POA: Insufficient documentation

## 2020-03-30 DIAGNOSIS — Z1231 Encounter for screening mammogram for malignant neoplasm of breast: Secondary | ICD-10-CM | POA: Diagnosis not present

## 2020-03-30 DIAGNOSIS — Z1211 Encounter for screening for malignant neoplasm of colon: Secondary | ICD-10-CM | POA: Diagnosis not present

## 2020-03-30 DIAGNOSIS — N959 Unspecified menopausal and perimenopausal disorder: Secondary | ICD-10-CM | POA: Diagnosis not present

## 2020-03-30 DIAGNOSIS — E1142 Type 2 diabetes mellitus with diabetic polyneuropathy: Secondary | ICD-10-CM | POA: Diagnosis not present

## 2020-03-30 DIAGNOSIS — F419 Anxiety disorder, unspecified: Secondary | ICD-10-CM | POA: Insufficient documentation

## 2020-03-30 DIAGNOSIS — E119 Type 2 diabetes mellitus without complications: Secondary | ICD-10-CM | POA: Diagnosis not present

## 2020-03-30 LAB — POCT URINALYSIS DIPSTICK
Bilirubin, UA: NEGATIVE
Blood, UA: NEGATIVE
Glucose, UA: NEGATIVE
Ketones, UA: NEGATIVE
Nitrite, UA: POSITIVE
Protein, UA: NEGATIVE
Spec Grav, UA: 1.01 (ref 1.010–1.025)
Urobilinogen, UA: 0.2 E.U./dL
pH, UA: 5 (ref 5.0–8.0)

## 2020-03-30 LAB — POCT UA - MICROALBUMIN
Creatinine, POC: 10 mg/dL
Microalbumin Ur, POC: 10 mg/L

## 2020-03-30 MED ORDER — GLIMEPIRIDE 4 MG PO TABS: 4.0000 mg | ORAL_TABLET | Freq: Every day | ORAL | 3 refills | Status: DC

## 2020-03-30 MED ORDER — RIZATRIPTAN BENZOATE 10 MG PO TBDP: ORAL_TABLET | ORAL | 3 refills | Status: DC

## 2020-03-30 MED ORDER — ICOSAPENT ETHYL 1 G PO CAPS: ORAL_CAPSULE | ORAL | 3 refills | Status: DC

## 2020-03-30 MED ORDER — JANUMET XR 100-1000 MG PO TB24: 1.0000 | ORAL_TABLET | Freq: Every day | ORAL | 3 refills | Status: DC

## 2020-03-30 MED ORDER — GABAPENTIN 300 MG PO CAPS: ORAL_CAPSULE | ORAL | 1 refills | Status: DC

## 2020-03-30 MED ORDER — DULOXETINE HCL 60 MG PO CPEP: 60.0000 mg | ORAL_CAPSULE | Freq: Two times a day (BID) | ORAL | 3 refills | Status: DC

## 2020-03-30 MED ORDER — METHOCARBAMOL 500 MG PO TABS: 500.0000 mg | ORAL_TABLET | Freq: Every day | ORAL | 3 refills | Status: DC | PRN

## 2020-03-30 MED ORDER — BUPROPION HCL ER (XL) 300 MG PO TB24: ORAL_TABLET | ORAL | 3 refills | Status: DC

## 2020-03-30 NOTE — Assessment & Plan Note (Signed)
Well controlled.  ?No changes to medicines.  ?Continue to work on eating a healthy diet and exercise.  ?Labs drawn today.  ?

## 2020-03-30 NOTE — Assessment & Plan Note (Signed)
Continue current meds as directed 

## 2020-03-30 NOTE — Progress Notes (Signed)
Established Patient Office Visit  Subjective:  Patient ID: Madison Stein, female    DOB: [birth year]  Age: 69 y.o. MRN: [id number]  CC:  Chief Complaint  Patient presents with  . Hyperlipidemia    HPI Madison Stein presents for follow up diabetes   Patient presents with type 2 diabetes mellitus with diabetic polyneuropathy.  Specifically, this is type 2, non-insulin requiring diabetes without complications.  Date of diagnosis 08/2011.  Compliance with treatment has been fair; she skips some medication doses.  Primary symptoms reported include nerve dysfunction (manifested as burning ).  She specifically denies blurred vision, fatigue, headache, hypoglycemia, leg cramps, nocturia, excessive thirst or frequent urination.  Depression screening is positive for depression - stable on Cymbalta.       Pt presents with hyperlipidemia.  Date of diagnosis 06/2012.  Current treatment includes Vascepa.  Compliance with treatment has been good; she takes her medication as directed and maintains her low cholesterol diet.  She denies experiencing any hypercholesterolemia related symptoms.  Concurrent health problems include diabetes.  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  Her symptom complex includes apprehension.  The frequency symptoms is several times per day.  Apparent triggers include occupational stressors and marital discord.  Current treatment includes Cymbalta and ativan 0.5mg  qd.  and also wellbutrin XL 300mg  qdShe denies pertinent past medical history.  Family history is pertinent for anxiety and depression.      Polymyalgia rheumatica details; this was diagnosed years ago.  Compliance with treatment has been good; Braden takes her medication as directed, maintains her diet and exercise regimen, and follows up as directed.  Primary complaints include joint stiffness. She denies associated swelling, redness, warmth,  deformity or myalgias.  The pattern of joint symptomatology has been stable and nonprogressive.  Primary joints affected include the cervical and lumbar spine and knees.  Support mechanisms include family, friends, and husband.  Pertinent medical history is remarkable for polymyalgia rheumatica.  Family history is remarkable for osteoarthritis and rheumatoid arthritis.  Currently on Neurontin 300 mg 1 QAM and 2 QPM, Cymbalta 60 mg BID    Past Medical History:  Diagnosis Date  . Anxiety   . Arthritis   . Bronchitis 2013   Hx of  . Complication of anesthesia   . Depression    takes Cymbalta daily  . Depression   . Depression with anxiety   . Diabetes mellitus without complication ([place])    takes Metformin daily  . Diabetes mellitus without complication ([place])    Type II  . Family history of adverse reaction to anesthesia    patients mother and sister gets sick  . Family history of anesthesia complication    mom and sister get very sick  . Fibromyalgia   . History of bronchitis 72yrs ago  . History of shingles   . Hyperlipidemia    takes Fish Oil bid  . Hyperlipidemia   . Insomnia    has Ambien if needed  . Joint pain   . Nontoxic (diffuse) goiter   . Peripheral neuropathy    takes Lyrica daily  . Pneumonia 76yrs ago   hx of  . Pneumonia    hx of  . Polymyalgia rheumatica ([place])   . PONV (postoperative nausea and vomiting)   . Pure hyperglyceridemia   . Thyroid nodule   . Type 2 diabetes mellitus with diabetic polyneuropathy ([place])   . Urgency  of urination    Leakage wears pad    Past Surgical History:  Procedure Laterality Date  . BACK SURGERY  1989   lumbar disc  . CARPAL TUNNEL RELEASE Right 1985  . CATARACT EXTRACTION  2020  . CERVICAL FUSION  2009  . CHOLECYSTECTOMY  2007  . COLONOSCOPY    . ESOPHAGOGASTRODUODENOSCOPY    . EYE SURGERY Bilateral 2007   laser eye surgery   . HAMMER TOE SURGERY  2019  . HIP ARTHROPLASTY Bilateral   . REFRACTIVE SURGERY  2008  .  TOTAL HIP ARTHROPLASTY Right 11/20/2013   Procedure: RIGHT TOTAL HIP ARTHROPLASTY;  Surgeon: Kerin Salen, MD;  Location: [facility];  Service: Orthopedics;  Laterality: Right;  . TOTAL HIP ARTHROPLASTY Left 08/30/2014   Procedure: TOTAL HIP ARTHROPLASTY;  Surgeon: Kerin Salen, MD;  Location: [facility];  Service: Orthopedics;  Laterality: Left;  . TOTAL KNEE ARTHROPLASTY Left 06/06/2015   Procedure: TOTAL KNEE ARTHROPLASTY;  Surgeon: Frederik Pear, MD;  Location: [facility];  Service: Orthopedics;  Laterality: Left;  . TOTAL KNEE ARTHROPLASTY Right 09/23/2017   Procedure: TOTAL KNEE ARTHROPLASTY;  Surgeon: Frederik Pear, MD;  Location: [facility];  Service: Orthopedics;  Laterality: Right;  . TUBAL LIGATION  1987  . TUMOR REMOVAL Right    Hand  . tumor removed from right hand  2011  . ulnar nerve release Left 1996    Family History  Problem Relation Age of Onset  . Alzheimer's disease Mother   . Kidney failure Father   . Diabetes Father   . Asthma Daughter   . Healthy Daughter     Social History   Socioeconomic History  . Marital status: Divorced    Spouse name: Not on file  . Number of children: 1  . Years of education: Not on file  . Highest education level: Some college, no degree  Occupational History  . Not on file  Tobacco Use  . Smoking status: Former Research scientist (life sciences)  . Smokeless tobacco: Never Used  . Tobacco comment: quit smoking in 1997  Vaping Use  . Vaping Use: Never used  Substance and Sexual Activity  . Alcohol use: Not Currently    Comment: rare beer  . Drug use: No  . Sexual activity: Never    Birth control/protection: Post-menopausal  Other Topics Concern  . Not on file  Social History Narrative   Lives alone   Left handed   Caffeine: at the most 2 cups of coffee/day      ** Merged History Encounter **       Social Determinants of Health   Financial Resource Strain:   . Difficulty of Paying Living Expenses: Not on file  Food Insecurity:   . Worried About Sales executive in the Last Year: Not on file  . Ran Out of Food in the Last Year: Not on file  Transportation Needs:   . Lack of Transportation (Medical): Not on file  . Lack of Transportation (Non-Medical): Not on file  Physical Activity:   . Days of Exercise per Week: Not on file  . Minutes of Exercise per Session: Not on file  Stress:   . Feeling of Stress : Not on file  Social Connections:   . Frequency of Communication with Friends and Family: Not on file  . Frequency of Social Gatherings with Friends and Family: Not on file  . Attends Religious Services: Not on file  . Active Member of Clubs or Organizations: Not  on file  . Attends Archivist Meetings: Not on file  . Marital Status: Not on file  Intimate Partner Violence:   . Fear of Current or Ex-Partner: Not on file  . Emotionally Abused: Not on file  . Physically Abused: Not on file  . Sexually Abused: Not on file     Current Outpatient Medications:  .  acetaminophen (TYLENOL 8 HOUR ARTHRITIS PAIN) 650 MG CR tablet, Take 650-1,300 mg by mouth 2 (two) times daily as needed for pain., Disp: , Rfl:  .  albuterol (VENTOLIN HFA) 108 (90 Base) MCG/ACT inhaler, INHALE 2 PUFFS INTO THE LUNGS EVERY 6 HOURS AS NEEDED FOR WHEEZING OR SHORTNESS OF BREATH, Disp: 25.5 g, Rfl: 0 .  aspirin EC 81 MG tablet, Take 1 tablet (81 mg total) by mouth daily., Disp: 30 tablet, Rfl: 0 .  buPROPion (WELLBUTRIN XL) 300 MG 24 hr tablet, TAKE 1 TABLET(300 MG) BY MOUTH DAILY, Disp: 30 tablet, Rfl: 3 .  DULoxetine (CYMBALTA) 60 MG capsule, Take 1 capsule (60 mg total) by mouth 2 (two) times daily., Disp: 60 capsule, Rfl: 3 .  FREESTYLE LITE test strip, CHECK BLOOD SUGAR EVERY DAY, Disp: , Rfl:  .  gabapentin (NEURONTIN) 300 MG capsule, TAKE ONE CAPSULE BY MOUTH EVERY MORNING AND 2 CAPSULES AT BEDTIME, Disp: 90 capsule, Rfl: 1 .  glimepiride (AMARYL) 4 MG tablet, Take 1 tablet (4 mg total) by mouth daily., Disp: 90 tablet, Rfl: 3 .  icosapent Ethyl  (VASCEPA) 1 g capsule, TAKE 2 CAPSULES BY MOUTH TWICE DAILY WITH FOOD, Disp: 120 capsule, Rfl: 3 .  Multiple Vitamin (MULTIVITAMIN WITH MINERALS) TABS tablet, Take 1 tablet by mouth daily., Disp: , Rfl:  .  omeprazole (PRILOSEC) 40 MG capsule, TAKE 1 CAPSULE(40 MG) BY MOUTH DAILY, Disp: 90 capsule, Rfl: 0 .  rizatriptan (MAXALT-MLT) 10 MG disintegrating tablet, DISSOLVE 1 TABLET ON TONGUE, MAY REPEAT AT 2 HOUR INTERVALS IF NEEDED. MAXIMUM OF 3 TABLETS IN 24 HOURS, Disp: 6 tablet, Rfl: 3 .  SitaGLIPtin-MetFORMIN HCl (JANUMET XR) [phone] MG TB24, Take 1 tablet by mouth daily with supper., Disp: 30 tablet, Rfl: 3 .  methocarbamol (ROBAXIN) 500 MG tablet, Take 1 tablet (500 mg total) by mouth daily as needed for muscle spasms., Disp: 30 tablet, Rfl: 3   Allergies  Allergen Reactions  . Morphine And Related Nausea And Vomiting    "just don't tolerate it well"    ROS CONSTITUTIONAL: Negative for chills, fatigue, fever, unintentional weight gain and unintentional weight loss.  E/N/T: Negative for ear pain, nasal congestion and sore throat.  CARDIOVASCULAR: Negative for chest pain, dizziness, palpitations and pedal edema.  RESPIRATORY: Negative for recent cough and dyspnea.  GASTROINTESTINAL: Negative for abdominal pain, acid reflux symptoms, constipation, diarrhea, nausea and vomiting.  MSK: see HPI INTEGUMENTARY: Negative for rash.  NEUROLOGICAL: Negative for dizziness and headaches.  PSYCHIATRIC: Negative for sleep disturbance and to question depression screen.  Negative for depression, negative for anhedonia.        Objective:    PHYSICAL EXAM:   VS: BP 118/72 (BP Location: Left Arm, Patient Position: Sitting, Cuff Size: Normal)   Pulse 92   Temp 97.8 F (36.6 C) (Temporal)   Ht 5\' 2"  (1.575 m)   Wt 209 lb (94.8 kg)   SpO2 98%   BMI 38.23 kg/m   GEN: Well nourished, well developed, in no acute distress   Cardiac: RRR; no murmurs, rubs, or gallops,no edema - no significant  varicosities Respiratory:  normal respiratory rate and pattern with no distress - normal breath sounds with no rales, rhonchi, wheezes or rubs  Skin: warm and dry, no rash  Neuro:  Alert and Oriented x 3, Strength and sensation are intact - CN II-Xii grossly intact Psych: euthymic mood, appropriate affect and demeanor  BP 118/72 (BP Location: Left Arm, Patient Position: Sitting, Cuff Size: Normal)   Pulse 92   Temp 97.8 F (36.6 C) (Temporal)   Ht 5\' 2"  (1.575 m)   Wt 209 lb (94.8 kg)   SpO2 98%   BMI 38.23 kg/m  Wt Readings from Last 3 Encounters:  03/30/20 209 lb (94.8 kg)  03/03/20 213 lb (96.6 kg)  12/16/19 230 lb (104.3 kg)   Diabetic Foot Exam - Simple   No data filed      Office Visit on 03/30/2020  Component Date Value Ref Range Status  . Glucose, UA 03/30/2020 Negative  Negative Final  . Bilirubin, UA 03/30/2020 Negative   Final  . Ketones, UA 03/30/2020 Negative   Final  . Spec Grav, UA 03/30/2020 1.010  1.010 - 1.025 Final  . Blood, UA 03/30/2020 Negative   Final  . pH, UA 03/30/2020 5.0  5.0 - 8.0 Final  . Protein, UA 03/30/2020 Negative  Negative Final  . Urobilinogen, UA 03/30/2020 0.2  0.2 or 1.0 E.U./dL Final  . Nitrite, UA 03/30/2020 Positive   Final  . Leukocytes, UA 03/30/2020 Trace* Negative Final  . Microalbumin Ur, POC 03/30/2020 10  mg/L Final  . Creatinine, POC 03/30/2020 10  mg/dL Final    Health Maintenance Due  Topic Date Due  . TETANUS/TDAP  Never done    There are no preventive care reminders to display for this patient.  Lab Results  Component Value Date   TSH 1.220 11/06/2019   Lab Results  Component Value Date   WBC 12.0 (H) 11/06/2019   HGB 12.8 11/06/2019   HCT 40.4 11/06/2019   MCV 82 11/06/2019   PLT 371 11/06/2019   Lab Results  Component Value Date   NA 138 11/06/2019   K 5.1 11/06/2019   CO2 23 11/06/2019   GLUCOSE 176 (H) 11/06/2019   BUN 19 11/06/2019   CREATININE 1.06 (H) 11/06/2019   BILITOT 0.2 11/06/2019    ALKPHOS 95 11/06/2019   AST 16 11/06/2019   ALT 12 11/06/2019   PROT 6.8 11/06/2019   ALBUMIN 4.1 11/06/2019   CALCIUM 9.7 11/06/2019   ANIONGAP 7 09/24/2017   Lab Results  Component Value Date   CHOL 169 11/06/2019   Lab Results  Component Value Date   HDL 54 11/06/2019   Lab Results  Component Value Date   LDLCALC 81 11/06/2019   Lab Results  Component Value Date   TRIG 207 (H) 11/06/2019   Lab Results  Component Value Date   CHOLHDL 3.1 11/06/2019   Lab Results  Component Value Date   HGBA1C 6.9 (H) 11/06/2019      Assessment & Plan:   Problem List Items Addressed This Visit      Endocrine   Type 2 diabetes mellitus with diabetic polyneuropathy ([place] ) - Primary    Well controlled.  No changes to medicines.  Continue to work on eating a healthy diet and exercise.  Labs drawn today.        Relevant Medications   buPROPion (WELLBUTRIN XL) 300 MG 24 hr tablet   DULoxetine (CYMBALTA) 60 MG capsule   gabapentin (NEURONTIN) 300 MG capsule   glimepiride (  AMARYL) 4 MG tablet   SitaGLIPtin-MetFORMIN HCl (JANUMET XR) [phone] MG TB24   methocarbamol (ROBAXIN) 500 MG tablet   Other Relevant Orders   CBC with Differential/Platelet   Comprehensive metabolic panel   Hemoglobin A1c   POCT urinalysis dipstick (Completed)   POCT UA - Microalbumin (Completed)     Other   Mixed hyperlipidemia    Well controlled.  No changes to medicines.  Continue to work on eating a healthy diet and exercise.  Labs drawn today.        Relevant Medications   icosapent Ethyl (VASCEPA) 1 g capsule   Other Relevant Orders   Lipid panel   Anxiety    Continue current meds as directed      Relevant Medications   buPROPion (WELLBUTRIN XL) 300 MG 24 hr tablet   DULoxetine (CYMBALTA) 60 MG capsule    Other Visit Diagnoses    Colon cancer screening       Relevant Orders   Ambulatory referral to Gastroenterology   Visit for screening mammogram       Relevant Orders   MM  Digital Screening   Menopausal and postmenopausal disorder       Relevant Orders   DG Bone Density      Meds ordered this encounter  Medications  . buPROPion (WELLBUTRIN XL) 300 MG 24 hr tablet    Sig: TAKE 1 TABLET(300 MG) BY MOUTH DAILY    Dispense:  30 tablet    Refill:  3    Order Specific Question:   Supervising Provider    AnswerShelton Silvas  . DULoxetine (CYMBALTA) 60 MG capsule    Sig: Take 1 capsule (60 mg total) by mouth 2 (two) times daily.    Dispense:  60 capsule    Refill:  3    Order Specific Question:   Supervising Provider    AnswerRochel Brome [id number]  . gabapentin (NEURONTIN) 300 MG capsule    Sig: TAKE ONE CAPSULE BY MOUTH EVERY MORNING AND 2 CAPSULES AT BEDTIME    Dispense:  90 capsule    Refill:  1    Order Specific Question:   Supervising Provider    AnswerShelton Silvas  . glimepiride (AMARYL) 4 MG tablet    Sig: Take 1 tablet (4 mg total) by mouth daily.    Dispense:  90 tablet    Refill:  3    Order Specific Question:   Supervising Provider    AnswerRochel Brome [id number]  . icosapent Ethyl (VASCEPA) 1 g capsule    Sig: TAKE 2 CAPSULES BY MOUTH TWICE DAILY WITH FOOD    Dispense:  120 capsule    Refill:  3    Order Specific Question:   Supervising Provider    AnswerShelton Silvas  . rizatriptan (MAXALT-MLT) 10 MG disintegrating tablet    Sig: DISSOLVE 1 TABLET ON TONGUE, MAY REPEAT AT 2 HOUR INTERVALS IF NEEDED. MAXIMUM OF 3 TABLETS IN 24 HOURS    Dispense:  6 tablet    Refill:  3    Order Specific Question:   Supervising Provider    AnswerShelton Silvas  . SitaGLIPtin-MetFORMIN HCl (JANUMET XR) [phone] MG TB24    Sig: Take 1 tablet by mouth daily with supper.    Dispense:  30 tablet    Refill:  3    Order Specific Question:   Supervising Provider  AnswerRochel Brome [id number]  . methocarbamol (ROBAXIN) 500 MG tablet    Sig: Take 1 tablet (500 mg total) by mouth daily as needed for  muscle spasms.    Dispense:  30 tablet    Refill:  3    Order Specific Question:   Supervising Provider    AnswerShelton Silvas    Follow-up: Return in about 3 months (around 06/29/2020) for chronic fasting follow up.    SARA R Chavez Rosol, PA-C

## 2020-03-31 LAB — CBC WITH DIFFERENTIAL/PLATELET
Basophils Absolute: 0.1 10*3/uL (ref 0.0–0.2)
Basos: 1 %
EOS (ABSOLUTE): 0.4 10*3/uL (ref 0.0–0.4)
Eos: 4 %
Hematocrit: 42.6 % (ref 34.0–46.6)
Hemoglobin: 13.5 g/dL (ref 11.1–15.9)
Immature Grans (Abs): 0.1 10*3/uL (ref 0.0–0.1)
Immature Granulocytes: 1 %
Lymphocytes Absolute: 2.5 10*3/uL (ref 0.7–3.1)
Lymphs: 23 %
MCH: 25.2 pg — ABNORMAL LOW (ref 26.6–33.0)
MCHC: 31.7 g/dL (ref 31.5–35.7)
MCV: 80 fL (ref 79–97)
Monocytes Absolute: 0.7 10*3/uL (ref 0.1–0.9)
Monocytes: 7 %
Neutrophils Absolute: 7.1 10*3/uL — ABNORMAL HIGH (ref 1.4–7.0)
Neutrophils: 64 %
Platelets: 417 10*3/uL (ref 150–450)
RBC: 5.35 x10E6/uL — ABNORMAL HIGH (ref 3.77–5.28)
RDW: 14 % (ref 11.7–15.4)
WBC: 10.9 10*3/uL — ABNORMAL HIGH (ref 3.4–10.8)

## 2020-03-31 LAB — COMPREHENSIVE METABOLIC PANEL
ALT: 16 IU/L (ref 0–32)
AST: 16 IU/L (ref 0–40)
Albumin/Globulin Ratio: 1.7 (ref 1.2–2.2)
Albumin: 4.3 g/dL (ref 3.8–4.8)
Alkaline Phosphatase: 106 IU/L (ref 44–121)
BUN/Creatinine Ratio: 17 (ref 12–28)
BUN: 17 mg/dL (ref 8–27)
Bilirubin Total: 0.3 mg/dL (ref 0.0–1.2)
CO2: 28 mmol/L (ref 20–29)
Calcium: 9.4 mg/dL (ref 8.7–10.3)
Chloride: 99 mmol/L (ref 96–106)
Creatinine, Ser: 1 mg/dL (ref 0.57–1.00)
GFR calc Af Amer: 67 mL/min/{1.73_m2} (ref >59)
GFR calc non Af Amer: 58 mL/min/{1.73_m2} — ABNORMAL LOW (ref >59)
Globulin, Total: 2.6 g/dL (ref 1.5–4.5)
Glucose: 171 mg/dL — ABNORMAL HIGH (ref 65–99)
Potassium: 5.1 mmol/L (ref 3.5–5.2)
Sodium: 138 mmol/L (ref 134–144)
Total Protein: 6.9 g/dL (ref 6.0–8.5)

## 2020-03-31 LAB — LIPID PANEL
Chol/HDL Ratio: 3 ratio (ref 0.0–4.4)
Cholesterol, Total: 155 mg/dL (ref 100–199)
HDL: 52 mg/dL (ref >39)
LDL Chol Calc (NIH): 77 mg/dL (ref 0–99)
Triglycerides: 151 mg/dL — ABNORMAL HIGH (ref 0–149)
VLDL Cholesterol Cal: 26 mg/dL (ref 5–40)

## 2020-03-31 LAB — HEMOGLOBIN A1C
Est. average glucose Bld gHb Est-mCnc: 131 mg/dL
Hgb A1c MFr Bld: 6.2 % — ABNORMAL HIGH (ref 4.8–5.6)

## 2020-03-31 LAB — CARDIOVASCULAR RISK ASSESSMENT

## 2020-04-12 ENCOUNTER — Other Ambulatory Visit: Payer: Self-pay | Admitting: Physician Assistant

## 2020-04-12 DIAGNOSIS — E1142 Type 2 diabetes mellitus with diabetic polyneuropathy: Secondary | ICD-10-CM

## 2020-04-12 MED ORDER — JANUMET XR 100-1000 MG PO TB24: 1.0000 | ORAL_TABLET | Freq: Every day | ORAL | 0 refills | Status: DC

## 2020-04-21 DIAGNOSIS — E559 Vitamin D deficiency, unspecified: Secondary | ICD-10-CM | POA: Diagnosis not present

## 2020-04-21 DIAGNOSIS — R5383 Other fatigue: Secondary | ICD-10-CM | POA: Diagnosis not present

## 2020-05-12 ENCOUNTER — Other Ambulatory Visit: Payer: Self-pay

## 2020-05-12 MED ORDER — FREESTYLE LIBRE 14 DAY SENSOR MISC: 2 refills | Status: DC

## 2020-05-12 MED ORDER — FREESTYLE LIBRE 14 DAY READER DEVI: 0 refills | Status: DC

## 2020-05-20 ENCOUNTER — Telehealth: Payer: Self-pay | Admitting: Physician Assistant

## 2020-05-20 NOTE — Progress Notes (Signed)
  Chronic Care Management   Outreach Note  05/20/2020 Name: Madison Stein MRN: [id number] DOB: [birth year]  Referred by: Marge Duncans, PA-C Reason for referral : Chronic Care Management   An unsuccessful telephone outreach was attempted today. The patient was referred to the pharmacist for assistance with care management and care coordination.   Follow Up Plan:   Madison Stein  Upstream Scheduler

## 2020-06-04 ENCOUNTER — Other Ambulatory Visit: Payer: Self-pay | Admitting: Physician Assistant

## 2020-06-11 ENCOUNTER — Other Ambulatory Visit: Payer: Self-pay | Admitting: Family Medicine

## 2020-06-11 DIAGNOSIS — E1142 Type 2 diabetes mellitus with diabetic polyneuropathy: Secondary | ICD-10-CM

## 2020-06-22 ENCOUNTER — Telehealth: Payer: Self-pay

## 2020-06-22 NOTE — Telephone Encounter (Signed)
I tried calling both phones in the pts chart. I was able to leave a message on [phone] number. I stated that her upcoming apt with sally for 12/27 will be canceled due to the provider being out of the office  and I asked for the pt to call back to RS the apt.

## 2020-06-25 ENCOUNTER — Encounter: Payer: Self-pay | Admitting: Physician Assistant

## 2020-06-28 ENCOUNTER — Other Ambulatory Visit: Payer: Self-pay | Admitting: Physician Assistant

## 2020-06-28 DIAGNOSIS — F419 Anxiety disorder, unspecified: Secondary | ICD-10-CM

## 2020-06-30 ENCOUNTER — Other Ambulatory Visit: Payer: Self-pay | Admitting: Physician Assistant

## 2020-07-04 ENCOUNTER — Ambulatory Visit: Payer: PPO | Admitting: Physician Assistant

## 2020-07-11 ENCOUNTER — Telehealth: Payer: Self-pay | Admitting: Physician Assistant

## 2020-07-11 NOTE — Progress Notes (Signed)
°  Chronic Care Management   Outreach Note  07/11/2020 Name: CHELBI HERBER MRN: [id number] DOB: 1950-10-29  Referred by: Marianne Sofia, PA-C Reason for referral : Chronic Care Management   A second unsuccessful telephone outreach was attempted today. The patient was referred to pharmacist for assistance with care management and care coordination.  Follow Up Plan:   Aggie Hacker  Upstream Scheduler

## 2020-07-12 ENCOUNTER — Encounter: Payer: Self-pay | Admitting: Physician Assistant

## 2020-07-12 ENCOUNTER — Ambulatory Visit (INDEPENDENT_AMBULATORY_CARE_PROVIDER_SITE_OTHER): Payer: PPO | Admitting: Physician Assistant

## 2020-07-12 ENCOUNTER — Other Ambulatory Visit: Payer: Self-pay

## 2020-07-12 VITALS — BP 128/74 | HR 108 | Temp 96.4°F | Ht 63.0 in | Wt 209.0 lb

## 2020-07-12 DIAGNOSIS — F419 Anxiety disorder, unspecified: Secondary | ICD-10-CM | POA: Diagnosis not present

## 2020-07-12 DIAGNOSIS — E1142 Type 2 diabetes mellitus with diabetic polyneuropathy: Secondary | ICD-10-CM

## 2020-07-12 DIAGNOSIS — E782 Mixed hyperlipidemia: Secondary | ICD-10-CM

## 2020-07-12 MED ORDER — OMEPRAZOLE 40 MG PO CPDR: DELAYED_RELEASE_CAPSULE | ORAL | 0 refills | Status: DC

## 2020-07-12 MED ORDER — JANUMET XR 100-1000 MG PO TB24: 1.0000 | ORAL_TABLET | Freq: Every day | ORAL | 0 refills | Status: DC

## 2020-07-12 MED ORDER — DULOXETINE HCL 60 MG PO CPEP: ORAL_CAPSULE | ORAL | 0 refills | Status: DC

## 2020-07-12 MED ORDER — GABAPENTIN 300 MG PO CAPS: ORAL_CAPSULE | ORAL | 1 refills | Status: DC

## 2020-07-12 MED ORDER — BUPROPION HCL ER (XL) 300 MG PO TB24: ORAL_TABLET | ORAL | 3 refills | Status: DC

## 2020-07-12 MED ORDER — GLIMEPIRIDE 4 MG PO TABS
4.0000 mg | ORAL_TABLET | Freq: Every day | ORAL | 0 refills | Status: DC
Start: 2020-07-12 — End: 2022-02-05

## 2020-07-12 MED ORDER — RIZATRIPTAN BENZOATE 10 MG PO TBDP: ORAL_TABLET | ORAL | 2 refills | Status: DC

## 2020-07-12 NOTE — Progress Notes (Signed)
Established Patient Office Visit  Subjective:  Patient ID: Madison Stein, female    DOB: March 15, 1951  Age: 70 y.o. MRN: [id number]  CC:  Chief Complaint  Patient presents with  . Diabetes  . Hyperlipidemia  . Gastroesophageal Reflux    HPI Madison Stein presents for follow up diabetes   Patient presents with type 2 diabetes mellitus with diabetic polyneuropathy.  Specifically, this is type 2, non-insulin requiring diabetes without complications.  Date of diagnosis 08/2011.  Compliance with treatment has been fair; she skips some medication doses.  Primary symptoms reported include nerve dysfunction (manifested as burning ).  She specifically denies blurred vision, fatigue, headache, hypoglycemia, leg cramps, nocturia, excessive thirst or frequent urination.  States her fasting glucose ranging in 130s -     Pt presents with hyperlipidemia.  Date of diagnosis 06/2012.  Current treatment includes Vascepa.  Compliance with treatment has been good; she takes her medication as directed and maintains her low cholesterol diet.  She denies experiencing any hypercholesterolemia related symptoms.  Concurrent health problems include diabetes.  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  Her symptom complex includes apprehension. Current treatment includes Cymbalta and ativan 0.5mg  qd.  and also wellbutrin XL 300mg  qdShe denies pertinent past medical history.  Family history is pertinent for anxiety and depression.      Polymyalgia rheumatica details; this was diagnosed years ago.  Compliance with treatment has been good; Madison Stein takes her medication as directed, maintains her diet and exercise regimen, and follows up as directed.  Primary complaints include joint stiffness. She denies associated swelling, redness, warmth, deformity or myalgias.  The pattern of joint symptomatology has been  stable and nonprogressive.  Primary joints affected include the cervical and lumbar spine and knees.  Support mechanisms include family, friends, and husband.  Pertinent medical history is remarkable for polymyalgia rheumatica.  Family history is remarkable for osteoarthritis and rheumatoid arthritis.  Currently on Neurontin 300 mg 1 QAM and 2 QPM, Cymbalta 60 mg BID    Past Medical History:  Diagnosis Date  . Anxiety   . Arthritis   . Bronchitis 2013   Hx of  . Complication of anesthesia   . Depression    takes Cymbalta daily  . Depression   . Depression with anxiety   . Diabetes mellitus without complication ([place])    takes Metformin daily  . Diabetes mellitus without complication ([place])    Type II  . Family history of adverse reaction to anesthesia    patients mother and sister gets sick  . Family history of anesthesia complication    mom and sister get very sick  . Fibromyalgia   . History of bronchitis 60yrs ago  . History of shingles   . Hyperlipidemia    takes Fish Oil bid  . Hyperlipidemia   . Insomnia    has Ambien if needed  . Joint pain   . Nontoxic (diffuse) goiter   . Peripheral neuropathy    takes Lyrica daily  . Pneumonia 77yrs ago   hx of  . Pneumonia    hx of  . Polymyalgia rheumatica ([place])   . PONV (postoperative nausea and vomiting)   . Pure hyperglyceridemia   . Thyroid nodule   . Type 2 diabetes mellitus with diabetic polyneuropathy ([place])   . Urgency of urination    Leakage wears  pad    Past Surgical History:  Procedure Laterality Date  . BACK SURGERY  1989   lumbar disc  . CARPAL TUNNEL RELEASE Right 1985  . CATARACT EXTRACTION  2020  . CERVICAL FUSION  2009  . CHOLECYSTECTOMY  2007  . COLONOSCOPY    . ESOPHAGOGASTRODUODENOSCOPY    . EYE SURGERY Bilateral 2007   laser eye surgery   . HAMMER TOE SURGERY  2019  . HIP ARTHROPLASTY Bilateral   . REFRACTIVE SURGERY  2008  . TOTAL HIP ARTHROPLASTY Right 11/20/2013   Procedure: RIGHT TOTAL HIP  ARTHROPLASTY;  Surgeon: Kerin Salen, MD;  Location: [facility];  Service: Orthopedics;  Laterality: Right;  . TOTAL HIP ARTHROPLASTY Left 08/30/2014   Procedure: TOTAL HIP ARTHROPLASTY;  Surgeon: Kerin Salen, MD;  Location: [facility];  Service: Orthopedics;  Laterality: Left;  . TOTAL KNEE ARTHROPLASTY Left 06/06/2015   Procedure: TOTAL KNEE ARTHROPLASTY;  Surgeon: Frederik Pear, MD;  Location: [facility];  Service: Orthopedics;  Laterality: Left;  . TOTAL KNEE ARTHROPLASTY Right 09/23/2017   Procedure: TOTAL KNEE ARTHROPLASTY;  Surgeon: Frederik Pear, MD;  Location: [facility];  Service: Orthopedics;  Laterality: Right;  . TUBAL LIGATION  1987  . TUMOR REMOVAL Right    Hand  . tumor removed from right hand  2011  . ulnar nerve release Left 1996    Family History  Problem Relation Age of Onset  . Alzheimer's disease Mother   . Kidney failure Father   . Diabetes Father   . Asthma Daughter   . Healthy Daughter     Social History   Socioeconomic History  . Marital status: Divorced    Spouse name: Not on file  . Number of children: 1  . Years of education: Not on file  . Highest education level: Some college, no degree  Occupational History  . Not on file  Tobacco Use  . Smoking status: Former Research scientist (life sciences)  . Smokeless tobacco: Never Used  . Tobacco comment: quit smoking in 1997  Vaping Use  . Vaping Use: Never used  Substance and Sexual Activity  . Alcohol use: Not Currently    Comment: rare beer  . Drug use: No  . Sexual activity: Never    Birth control/protection: Post-menopausal  Other Topics Concern  . Not on file  Social History Narrative   Lives alone   Left handed   Caffeine: at the most 2 cups of coffee/day      ** Merged History Encounter **       Social Determinants of Health   Financial Resource Strain: Not on file  Food Insecurity: Not on file  Transportation Needs: Not on file  Physical Activity: Not on file  Stress: Not on file  Social Connections: Not on file   Intimate Partner Violence: Not on file     Current Outpatient Medications:  .  acetaminophen (TYLENOL 8 HOUR ARTHRITIS PAIN) 650 MG CR tablet, Take 650-1,300 mg by mouth 2 (two) times daily as needed for pain., Disp: , Rfl:  .  albuterol (VENTOLIN HFA) 108 (90 Base) MCG/ACT inhaler, INHALE 2 PUFFS INTO THE LUNGS EVERY 6 HOURS AS NEEDED FOR WHEEZING OR SHORTNESS OF BREATH, Disp: 25.5 g, Rfl: 0 .  aspirin EC 81 MG tablet, Take 1 tablet (81 mg total) by mouth daily., Disp: 30 tablet, Rfl: 0 .  buPROPion (WELLBUTRIN XL) 300 MG 24 hr tablet, TAKE 1 TABLET(300 MG) BY MOUTH DAILY, Disp: 30 tablet, Rfl: 3 .  Continuous  Blood Gluc Receiver (FREESTYLE LIBRE 14 DAY READER) DEVI, none, Disp: 1 each, Rfl: 0 .  Continuous Blood Gluc Sensor (FREESTYLE LIBRE 14 DAY SENSOR) MISC, none, Disp: 1 each, Rfl: 2 .  DULoxetine (CYMBALTA) 60 MG capsule, TAKE 1 CAPSULE(60 MG) BY MOUTH TWICE DAILY, Disp: 180 capsule, Rfl: 0 .  FREESTYLE LITE test strip, CHECK BLOOD SUGAR EVERY DAY, Disp: , Rfl:  .  gabapentin (NEURONTIN) 300 MG capsule, TAKE ONE CAPSULE BY MOUTH EVERY MORNING AND 2 CAPSULES AT BEDTIME, Disp: 90 capsule, Rfl: 1 .  glimepiride (AMARYL) 4 MG tablet, Take 1 tablet (4 mg total) by mouth daily., Disp: 90 tablet, Rfl: 0 .  icosapent Ethyl (VASCEPA) 1 g capsule, TAKE 2 CAPSULES BY MOUTH TWICE DAILY WITH FOOD, Disp: 120 capsule, Rfl: 3 .  methocarbamol (ROBAXIN) 500 MG tablet, Take 1 tablet (500 mg total) by mouth daily as needed for muscle spasms., Disp: 30 tablet, Rfl: 3 .  Multiple Vitamin (MULTIVITAMIN WITH MINERALS) TABS tablet, Take 1 tablet by mouth daily., Disp: , Rfl:  .  omeprazole (PRILOSEC) 40 MG capsule, TAKE 1 CAPSULE(40 MG) BY MOUTH DAILY, Disp: 90 capsule, Rfl: 0 .  rizatriptan (MAXALT-MLT) 10 MG disintegrating tablet, DISSOLVE 1 TABLET ON TONGUE, MAY REPEAT AT 2 HOUR INTERVALS IF NEEDED. MAXIMUM OF 3 TABLETS IN 24 HOURS, Disp: 18 tablet, Rfl: 2 .  SitaGLIPtin-MetFORMIN HCl (JANUMET XR) [id number]  MG TB24, Take 1 tablet by mouth daily with supper., Disp: 90 tablet, Rfl: 0   Allergies  Allergen Reactions  . Morphine And Related Nausea And Vomiting    "just don't tolerate it well"    ROS CONSTITUTIONAL: Negative for chills, fatigue, fever, unintentional weight gain and unintentional weight loss.  E/N/T: Negative for ear pain, nasal congestion and sore throat.  CARDIOVASCULAR: Negative for chest pain, dizziness, palpitations and pedal edema.  RESPIRATORY: Negative for recent cough and dyspnea.  GASTROINTESTINAL: Negative for abdominal pain, acid reflux symptoms, constipation, diarrhea, nausea and vomiting.  MSK: Negative for arthralgias and myalgias.  INTEGUMENTARY: Negative for rash.  NEUROLOGICAL: Negative for dizziness and headaches.  PSYCHIATRIC: Negative for sleep disturbance and to question depression screen.  Negative for depression, negative for anhedonia.            Objective:    PHYSICAL EXAM:   VS: BP 128/74   Pulse (!) 108   Temp (!) 96.4 F (35.8 C)   Ht 5\' 3"  (1.6 m)   Wt 209 lb (94.8 kg)   SpO2 94%   BMI 37.02 kg/m   PHYSICAL EXAM:   VS: BP 128/74   Pulse (!) 108   Temp (!) 96.4 F (35.8 C)   Ht 5\' 3"  (1.6 m)   Wt 209 lb (94.8 kg)   SpO2 94%   BMI 37.02 kg/m   GEN: Well nourished, well developed, in no acute distress  Cardiac: RRR; no murmurs, rubs, or gallops,no edema - Respiratory:  normal respiratory rate and pattern with no distress - normal breath sounds with no rales, rhonchi, wheezes or rubs  MS: no deformity or atrophy  Skin: warm and dry, no rash  Neuro:  Alert and Oriented x 3, Strength and sensation are intact - CN II-Xii grossly intact Psych: euthymic mood, appropriate affect and demeanor   BP 128/74   Pulse (!) 108   Temp (!) 96.4 F (35.8 C)   Ht 5\' 3"  (1.6 m)   Wt 209 lb (94.8 kg)   SpO2 94%   BMI 37.02 kg/m  Wt  Readings from Last 3 Encounters:  07/12/20 209 lb (94.8 kg)  03/30/20 209 lb (94.8 kg)  03/03/20 213  lb (96.6 kg)   Diabetic Foot Exam - Simple   No data filed     No visits with results within 1 Day(s) from this visit.  Latest known visit with results is:  Abstract on 06/25/2020  Component Date Value Ref Range Status  . HM Mammogram 04/14/2019 0-4 Bi-Rad  0-4 Bi-Rad, Self Reported Normal Final   Bi-rads:1 Negative  . HM Dexa Scan 08/23/2017 Normal   Final    Health Maintenance Due  Topic Date Due  . TETANUS/TDAP  Never done  . COVID-19 Vaccine (3 - Booster for Moderna series) 04/06/2020    There are no preventive care reminders to display for this patient.  Lab Results  Component Value Date   TSH 1.220 11/06/2019   Lab Results  Component Value Date   WBC 10.9 (H) 03/30/2020   HGB 13.5 03/30/2020   HCT 42.6 03/30/2020   MCV 80 03/30/2020   PLT 417 03/30/2020   Lab Results  Component Value Date   NA 138 03/30/2020   K 5.1 03/30/2020   CO2 28 03/30/2020   GLUCOSE 171 (H) 03/30/2020   BUN 17 03/30/2020   CREATININE 1.00 03/30/2020   BILITOT 0.3 03/30/2020   ALKPHOS 106 03/30/2020   AST 16 03/30/2020   ALT 16 03/30/2020   PROT 6.9 03/30/2020   ALBUMIN 4.3 03/30/2020   CALCIUM 9.4 03/30/2020   ANIONGAP 7 09/24/2017   Lab Results  Component Value Date   CHOL 155 03/30/2020   Lab Results  Component Value Date   HDL 52 03/30/2020   Lab Results  Component Value Date   LDLCALC 77 03/30/2020   Lab Results  Component Value Date   TRIG 151 (H) 03/30/2020   Lab Results  Component Value Date   CHOLHDL 3.0 03/30/2020   Lab Results  Component Value Date   HGBA1C 6.2 (H) 03/30/2020      Assessment & Plan:   Problem List Items Addressed This Visit      Endocrine   Type 2 diabetes mellitus with diabetic polyneuropathy ([facility])   Relevant Medications   buPROPion (WELLBUTRIN XL) 300 MG 24 hr tablet   DULoxetine (CYMBALTA) 60 MG capsule   gabapentin (NEURONTIN) 300 MG capsule   glimepiride (AMARYL) 4 MG tablet   SitaGLIPtin-MetFORMIN HCl (JANUMET XR)  [id number] MG TB24   Other Relevant Orders   CBC with Differential/Platelet   Comprehensive metabolic panel   Hemoglobin A1c     Other   Mixed hyperlipidemia - Primary   Relevant Orders   Lipid panel   Anxiety   Relevant Medications   buPROPion (WELLBUTRIN XL) 300 MG 24 hr tablet   DULoxetine (CYMBALTA) 60 MG capsule      Meds ordered this encounter  Medications  . buPROPion (WELLBUTRIN XL) 300 MG 24 hr tablet    Sig: TAKE 1 TABLET(300 MG) BY MOUTH DAILY    Dispense:  30 tablet    Refill:  3  . DULoxetine (CYMBALTA) 60 MG capsule    Sig: TAKE 1 CAPSULE(60 MG) BY MOUTH TWICE DAILY    Dispense:  180 capsule    Refill:  0    **Patient requests 90 days supply**  . gabapentin (NEURONTIN) 300 MG capsule    Sig: TAKE ONE CAPSULE BY MOUTH EVERY MORNING AND 2 CAPSULES AT BEDTIME    Dispense:  90 capsule    Refill:  1  . glimepiride (AMARYL) 4 MG tablet    Sig: Take 1 tablet (4 mg total) by mouth daily.    Dispense:  90 tablet    Refill:  0  . omeprazole (PRILOSEC) 40 MG capsule    Sig: TAKE 1 CAPSULE(40 MG) BY MOUTH DAILY    Dispense:  90 capsule    Refill:  0    **Patient requests 90 days supply**  . rizatriptan (MAXALT-MLT) 10 MG disintegrating tablet    Sig: DISSOLVE 1 TABLET ON TONGUE, MAY REPEAT AT 2 HOUR INTERVALS IF NEEDED. MAXIMUM OF 3 TABLETS IN 24 HOURS    Dispense:  18 tablet    Refill:  2  . SitaGLIPtin-MetFORMIN HCl (JANUMET XR) [id number] MG TB24    Sig: Take 1 tablet by mouth daily with supper.    Dispense:  90 tablet    Refill:  0    Follow-up: Return in about 3 months (around 10/10/2020) for chronic fasting.    SARA R Jarmaine Ehrler, PA-C

## 2020-07-13 LAB — LIPID PANEL
Chol/HDL Ratio: 3 ratio (ref 0.0–4.4)
Cholesterol, Total: 188 mg/dL (ref 100–199)
HDL: 62 mg/dL (ref >39)
LDL Chol Calc (NIH): 91 mg/dL (ref 0–99)
Triglycerides: 211 mg/dL — ABNORMAL HIGH (ref 0–149)
VLDL Cholesterol Cal: 35 mg/dL (ref 5–40)

## 2020-07-13 LAB — CBC WITH DIFFERENTIAL/PLATELET
Basophils Absolute: 0.1 10*3/uL (ref 0.0–0.2)
Basos: 1 %
EOS (ABSOLUTE): 0.5 10*3/uL — ABNORMAL HIGH (ref 0.0–0.4)
Eos: 4 %
Hematocrit: 40.8 % (ref 34.0–46.6)
Hemoglobin: 13 g/dL (ref 11.1–15.9)
Immature Grans (Abs): 0.1 10*3/uL (ref 0.0–0.1)
Immature Granulocytes: 1 %
Lymphocytes Absolute: 2.5 10*3/uL (ref 0.7–3.1)
Lymphs: 23 %
MCH: 25.4 pg — ABNORMAL LOW (ref 26.6–33.0)
MCHC: 31.9 g/dL (ref 31.5–35.7)
MCV: 80 fL (ref 79–97)
Monocytes Absolute: 0.8 10*3/uL (ref 0.1–0.9)
Monocytes: 7 %
Neutrophils Absolute: 6.9 10*3/uL (ref 1.4–7.0)
Neutrophils: 64 %
Platelets: 363 10*3/uL (ref 150–450)
RBC: 5.12 x10E6/uL (ref 3.77–5.28)
RDW: 14.2 % (ref 11.7–15.4)
WBC: 10.9 10*3/uL — ABNORMAL HIGH (ref 3.4–10.8)

## 2020-07-13 LAB — COMPREHENSIVE METABOLIC PANEL
ALT: 10 IU/L (ref 0–32)
AST: 13 IU/L (ref 0–40)
Albumin/Globulin Ratio: 1.5 (ref 1.2–2.2)
Albumin: 4.3 g/dL (ref 3.8–4.8)
Alkaline Phosphatase: 113 IU/L (ref 44–121)
BUN/Creatinine Ratio: 16 (ref 12–28)
BUN: 15 mg/dL (ref 8–27)
Bilirubin Total: 0.3 mg/dL (ref 0.0–1.2)
CO2: 24 mmol/L (ref 20–29)
Calcium: 9.4 mg/dL (ref 8.7–10.3)
Chloride: 100 mmol/L (ref 96–106)
Creatinine, Ser: 0.95 mg/dL (ref 0.57–1.00)
GFR calc Af Amer: 72 mL/min/{1.73_m2} (ref >59)
GFR calc non Af Amer: 62 mL/min/{1.73_m2} (ref >59)
Globulin, Total: 2.8 g/dL (ref 1.5–4.5)
Glucose: 101 mg/dL — ABNORMAL HIGH (ref 65–99)
Potassium: 5 mmol/L (ref 3.5–5.2)
Sodium: 139 mmol/L (ref 134–144)
Total Protein: 7.1 g/dL (ref 6.0–8.5)

## 2020-07-13 LAB — CARDIOVASCULAR RISK ASSESSMENT

## 2020-07-13 LAB — HEMOGLOBIN A1C
Est. average glucose Bld gHb Est-mCnc: 134 mg/dL
Hgb A1c MFr Bld: 6.3 % — ABNORMAL HIGH (ref 4.8–5.6)

## 2020-07-20 ENCOUNTER — Other Ambulatory Visit: Payer: Self-pay | Admitting: Family Medicine

## 2020-07-31 ENCOUNTER — Other Ambulatory Visit: Payer: Self-pay | Admitting: Physician Assistant

## 2020-08-10 ENCOUNTER — Telehealth: Payer: Self-pay | Admitting: Physician Assistant

## 2020-08-10 NOTE — Progress Notes (Signed)
°  Chronic Care Management   Outreach Note  08/10/2020 Name: Madison Stein MRN: [id number] DOB: 19-May-1951  Referred by: Marge Duncans, PA-C Reason for referral : Chronic Care Management   Third unsuccessful telephone outreach was attempted today. The patient was referred to the pharmacist for assistance with care management and care coordination.   Follow Up Plan:   [name]  Upstream Scheduler

## 2020-08-14 DIAGNOSIS — Z20822 Contact with and (suspected) exposure to covid-19: Secondary | ICD-10-CM | POA: Diagnosis not present

## 2020-09-06 ENCOUNTER — Other Ambulatory Visit: Payer: Self-pay | Admitting: Physician Assistant

## 2020-09-29 ENCOUNTER — Other Ambulatory Visit: Payer: Self-pay | Admitting: Physician Assistant

## 2020-10-06 ENCOUNTER — Other Ambulatory Visit: Payer: Self-pay | Admitting: Physician Assistant

## 2020-10-11 ENCOUNTER — Encounter: Payer: Self-pay | Admitting: Physician Assistant

## 2020-10-11 ENCOUNTER — Other Ambulatory Visit: Payer: Self-pay

## 2020-10-11 ENCOUNTER — Ambulatory Visit (INDEPENDENT_AMBULATORY_CARE_PROVIDER_SITE_OTHER): Payer: PPO | Admitting: Physician Assistant

## 2020-10-11 VITALS — BP 122/62 | HR 95 | Temp 97.5°F | Resp 16 | Ht 63.0 in | Wt 215.2 lb

## 2020-10-11 DIAGNOSIS — R3589 Other polyuria: Secondary | ICD-10-CM

## 2020-10-11 DIAGNOSIS — F419 Anxiety disorder, unspecified: Secondary | ICD-10-CM

## 2020-10-11 DIAGNOSIS — N959 Unspecified menopausal and perimenopausal disorder: Secondary | ICD-10-CM

## 2020-10-11 DIAGNOSIS — E1142 Type 2 diabetes mellitus with diabetic polyneuropathy: Secondary | ICD-10-CM

## 2020-10-11 DIAGNOSIS — M353 Polymyalgia rheumatica: Secondary | ICD-10-CM | POA: Diagnosis not present

## 2020-10-11 DIAGNOSIS — F418 Other specified anxiety disorders: Secondary | ICD-10-CM

## 2020-10-11 DIAGNOSIS — E782 Mixed hyperlipidemia: Secondary | ICD-10-CM

## 2020-10-11 LAB — POCT URINALYSIS DIP (CLINITEK)
Bilirubin, UA: NEGATIVE
Blood, UA: NEGATIVE
Glucose, UA: NEGATIVE mg/dL
Ketones, POC UA: NEGATIVE mg/dL
Leukocytes, UA: NEGATIVE
Nitrite, UA: POSITIVE — AB
POC PROTEIN,UA: NEGATIVE
Spec Grav, UA: 1.02 (ref 1.010–1.025)
Urobilinogen, UA: 0.2 E.U./dL
pH, UA: 6 (ref 5.0–8.0)

## 2020-10-11 MED ORDER — BUPROPION HCL ER (XL) 300 MG PO TB24: ORAL_TABLET | ORAL | 1 refills | Status: DC

## 2020-10-11 NOTE — Progress Notes (Signed)
Established Patient Office Visit  Subjective:  Patient ID: Madison Stein, female    DOB: [birth year]  Age: 70 y.o. MRN: [id number]  CC:  Chief Complaint  Patient presents with  . Hyperlipidemia  . Diabetes    HPI Madison Stein presents for follow up diabetes   Patient presents with type 2 diabetes mellitus with diabetic polyneuropathy.  Specifically, this is type 2, non-insulin requiring diabetes without complications.  Date of diagnosis 08/2011.  Compliance with treatment has been good  Primary symptoms reported include nerve dysfunction (manifested as burning ).  She specifically denies blurred vision, fatigue, headache,  leg cramps, nocturia, excessive thirst or frequent urination.  States her fasting glucose ranging in 130s -150s but states at times it does drop low to 50-60s - she states that most of the time it drops its because she has not eaten well - current meds include janumet XR and amaryl However will assess labwork and if hgb a1c on low side may stop amaryl      Pt presents with hyperlipidemia.  Date of diagnosis 06/2012.  Current treatment includes Vascepa.  Compliance with treatment has been good; she takes her medication as directed and maintains her low cholesterol diet.  She denies experiencing any hypercholesterolemia related symptoms.  Concurrent health problems include diabetes.  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  Current treatment includes Cymbalta and ativan 0.5mg  qd.  and also wellbutrin XL 300mg  qdShe denies pertinent past medical history.  Family history is pertinent for anxiety and depression.      Polymyalgia rheumatica details; this was diagnosed years ago.  Compliance with treatment has been good; Lita takes her medication as directed, maintains her diet and exercise regimen, and follows up as directed.  Primary complaints include joint  stiffness. She denies associated swelling, redness, warmth, deformity or myalgias.  .  Primary joints affected include the cervical and lumbar spine and knees.  Family history is remarkable for osteoarthritis and rheumatoid arthritis.  Currently on Neurontin 300 mg 1 QAM and 2 QPM, Cymbalta 60 mg BID   Pt with history of GERD - symptoms stable on Prilosec 40mg  qd Past Medical History:  Diagnosis Date  . Anxiety   . Arthritis   . Bronchitis 2013   Hx of  . Complication of anesthesia   . Depression    takes Cymbalta daily  . Depression   . Depression with anxiety   . Diabetes mellitus without complication ([place])    takes Metformin daily  . Diabetes mellitus without complication ([place])    Type II  . Family history of adverse reaction to anesthesia    patients mother and sister gets sick  . Family history of anesthesia complication    mom and sister get very sick  . Fibromyalgia   . History of bronchitis 98yrs ago  . History of shingles   . Hyperlipidemia    takes Fish Oil bid  . Hyperlipidemia   . Insomnia    has Ambien if needed  . Joint pain   . Nontoxic (diffuse) goiter   . Peripheral neuropathy    takes Lyrica daily  . Pneumonia 72yrs ago   hx of  . Pneumonia    hx of  . Polymyalgia rheumatica ([place])   . PONV (postoperative nausea and vomiting)   . Pure hyperglyceridemia   . Thyroid nodule   .  Type 2 diabetes mellitus with diabetic polyneuropathy ([place])   . Urgency of urination    Leakage wears pad    Past Surgical History:  Procedure Laterality Date  . BACK SURGERY  1989   lumbar disc  . CARPAL TUNNEL RELEASE Right 1985  . CATARACT EXTRACTION  2020  . CERVICAL FUSION  2009  . CHOLECYSTECTOMY  2007  . COLONOSCOPY    . ESOPHAGOGASTRODUODENOSCOPY    . EYE SURGERY Bilateral 2007   laser eye surgery   . HAMMER TOE SURGERY  2019  . HIP ARTHROPLASTY Bilateral   . REFRACTIVE SURGERY  2008  . TOTAL HIP ARTHROPLASTY Right 11/20/2013   Procedure: RIGHT TOTAL HIP  ARTHROPLASTY;  Surgeon: Kerin Salen, MD;  Location: [facility];  Service: Orthopedics;  Laterality: Right;  . TOTAL HIP ARTHROPLASTY Left 08/30/2014   Procedure: TOTAL HIP ARTHROPLASTY;  Surgeon: Kerin Salen, MD;  Location: [facility];  Service: Orthopedics;  Laterality: Left;  . TOTAL KNEE ARTHROPLASTY Left 06/06/2015   Procedure: TOTAL KNEE ARTHROPLASTY;  Surgeon: Frederik Pear, MD;  Location: [facility];  Service: Orthopedics;  Laterality: Left;  . TOTAL KNEE ARTHROPLASTY Right 09/23/2017   Procedure: TOTAL KNEE ARTHROPLASTY;  Surgeon: Frederik Pear, MD;  Location: [facility];  Service: Orthopedics;  Laterality: Right;  . TUBAL LIGATION  1987  . TUMOR REMOVAL Right    Hand  . tumor removed from right hand  2011  . ulnar nerve release Left 1996    Family History  Problem Relation Age of Onset  . Alzheimer's disease Mother   . Kidney failure Father   . Diabetes Father   . Asthma Daughter   . Healthy Daughter     Social History   Socioeconomic History  . Marital status: Divorced    Spouse name: Not on file  . Number of children: 1  . Years of education: Not on file  . Highest education level: Some college, no degree  Occupational History  . Not on file  Tobacco Use  . Smoking status: Former Research scientist (life sciences)  . Smokeless tobacco: Never Used  . Tobacco comment: quit smoking in 1997  Vaping Use  . Vaping Use: Never used  Substance and Sexual Activity  . Alcohol use: Not Currently    Comment: rare beer  . Drug use: No  . Sexual activity: Never    Birth control/protection: Post-menopausal  Other Topics Concern  . Not on file  Social History Narrative   Lives alone   Left handed   Caffeine: at the most 2 cups of coffee/day      ** Merged History Encounter **       Social Determinants of Health   Financial Resource Strain: Not on file  Food Insecurity: Not on file  Transportation Needs: Not on file  Physical Activity: Not on file  Stress: Not on file  Social Connections: Not on file   Intimate Partner Violence: Not on file     Current Outpatient Medications:  .  acetaminophen (TYLENOL) 650 MG CR tablet, Take 650-1,300 mg by mouth 2 (two) times daily as needed for pain., Disp: , Rfl:  .  albuterol (VENTOLIN HFA) 108 (90 Base) MCG/ACT inhaler, INHALE 2 PUFFS INTO THE LUNGS EVERY 6 HOURS AS NEEDED FOR WHEEZING OR SHORTNESS OF BREATH, Disp: 25.5 g, Rfl: 0 .  aspirin EC 81 MG tablet, Take 1 tablet (81 mg total) by mouth daily., Disp: 30 tablet, Rfl: 0 .  Continuous Blood Gluc Receiver (FREESTYLE LIBRE 14 DAY  READER) DEVI, none, Disp: 1 each, Rfl: 0 .  Continuous Blood Gluc Sensor (FREESTYLE LIBRE 14 DAY SENSOR) MISC, none, Disp: 1 each, Rfl: 2 .  DULoxetine (CYMBALTA) 60 MG capsule, TAKE 1 CAPSULE(60 MG) BY MOUTH TWICE DAILY, Disp: 180 capsule, Rfl: 0 .  FREESTYLE LITE test strip, CHECK BLOOD SUGAR EVERY DAY, Disp: 100 strip, Rfl: 1 .  gabapentin (NEURONTIN) 300 MG capsule, TAKE ONE CAPSULE BY MOUTH EVERY MORNING, AND 2 CAPSULES AT BEDTIME, Disp: 90 capsule, Rfl: 1 .  glimepiride (AMARYL) 4 MG tablet, Take 1 tablet (4 mg total) by mouth daily., Disp: 90 tablet, Rfl: 0 .  icosapent Ethyl (VASCEPA) 1 g capsule, TAKE 2 CAPSULES BY MOUTH TWICE DAILY WITH FOOD, Disp: 120 capsule, Rfl: 3 .  methocarbamol (ROBAXIN) 500 MG tablet, TAKE 1 TABLET(500 MG) BY MOUTH DAILY AS NEEDED FOR MUSCLE SPASMS, Disp: 30 tablet, Rfl: 3 .  Multiple Vitamin (MULTIVITAMIN WITH MINERALS) TABS tablet, Take 1 tablet by mouth daily., Disp: , Rfl:  .  omeprazole (PRILOSEC) 40 MG capsule, TAKE 1 CAPSULE(40 MG) BY MOUTH DAILY, Disp: 90 capsule, Rfl: 0 .  rizatriptan (MAXALT-MLT) 10 MG disintegrating tablet, DISSOLVE 1 TABLET ON TONGUE, MAY REPEAT AT 2 HOUR INTERVALS IF NEEDED. MAXIMUM OF 3 TABLETS IN 24 HOURS, Disp: 18 tablet, Rfl: 2 .  SitaGLIPtin-MetFORMIN HCl (JANUMET XR) [phone] MG TB24, Take 1 tablet by mouth daily with supper., Disp: 90 tablet, Rfl: 0 .  buPROPion (WELLBUTRIN XL) 300 MG 24 hr tablet, TAKE 1  TABLET(300 MG) BY MOUTH DAILY, Disp: 90 tablet, Rfl: 1   Allergies  Allergen Reactions  . Morphine And Related Nausea And Vomiting    "just don't tolerate it well"  . Zestril [Lisinopril] Cough    ROS CONSTITUTIONAL: Negative for chills, fatigue, fever, unintentional weight gain and unintentional weight loss.  E/N/T: Negative for ear pain, nasal congestion and sore throat.  CARDIOVASCULAR: Negative for chest pain, dizziness, palpitations and pedal edema.  RESPIRATORY: Negative for recent cough and dyspnea.  GASTROINTESTINAL: Negative for abdominal pain, acid reflux symptoms, constipation, diarrhea, nausea and vomiting.  GU - pt states she has had some polyuria but is drinking more water lately MSK: Negative for arthralgias and myalgias.  INTEGUMENTARY: Negative for rash.  NEUROLOGICAL: Negative for dizziness and headaches.  PSYCHIATRIC: Negative for sleep disturbance and to question depression screen.  Negative for depression, negative for anhedonia.     Office Visit on 10/11/2020  Component Date Value Ref Range Status  . Color, UA 10/11/2020 yellow  yellow Final  . Clarity, UA 10/11/2020 clear  clear Final  . Glucose, UA 10/11/2020 negative  negative mg/dL Final  . Bilirubin, UA 10/11/2020 negative  negative Final  . Ketones, POC UA 10/11/2020 negative  negative mg/dL Final  . Spec Grav, UA 10/11/2020 1.020  1.010 - 1.025 Final  . Blood, UA 10/11/2020 negative  negative Final  . pH, UA 10/11/2020 6.0  5.0 - 8.0 Final  . POC PROTEIN,UA 10/11/2020 negative  negative, trace Final  . Urobilinogen, UA 10/11/2020 0.2  0.2 or 1.0 E.U./dL Final  . Nitrite, UA 10/11/2020 Positive* Negative Final  . Leukocytes, UA 10/11/2020 Negative  Negative Final           Objective:    PHYSICAL EXAM:   VS: BP 122/62 (BP Location: Left Arm, Patient Position: Sitting, Cuff Size: Normal)   Pulse 95   Temp (!) 97.5 F (36.4 C) (Temporal)   Resp 16   Ht 5\' 3"  (1.6 m)  Wt 215 lb 3.2 oz (97.6  kg)   SpO2 98%   BMI 38.12 kg/m   PHYSICAL EXAM:   VS: BP 122/62 (BP Location: Left Arm, Patient Position: Sitting, Cuff Size: Normal)   Pulse 95   Temp (!) 97.5 F (36.4 C) (Temporal)   Resp 16   Ht 5\' 3"  (1.6 m)   Wt 215 lb 3.2 oz (97.6 kg)   SpO2 98%   BMI 38.12 kg/m   PHYSICAL EXAM:   VS: BP 122/62 (BP Location: Left Arm, Patient Position: Sitting, Cuff Size: Normal)   Pulse 95   Temp (!) 97.5 F (36.4 C) (Temporal)   Resp 16   Ht 5\' 3"  (1.6 m)   Wt 215 lb 3.2 oz (97.6 kg)   SpO2 98%   BMI 38.12 kg/m   GEN: Well nourished, well developed, in no acute distress  Cardiac: RRR; no murmurs, rubs, or gallops,no edema -  Respiratory:  normal respiratory rate and pattern with no distress - normal breath sounds with no rales, rhonchi, wheezes or rubs MS: no deformity or atrophy  Skin: warm and dry, no rash  Neuro:  Alert and Oriented x 3, Strength and sensation are intact - CN II-Xii grossly intact Psych: euthymic mood, appropriate affect and demeanor    BP 122/62 (BP Location: Left Arm, Patient Position: Sitting, Cuff Size: Normal)   Pulse 95   Temp (!) 97.5 F (36.4 C) (Temporal)   Resp 16   Ht 5\' 3"  (1.6 m)   Wt 215 lb 3.2 oz (97.6 kg)   SpO2 98%   BMI 38.12 kg/m  Wt Readings from Last 3 Encounters:  10/11/20 215 lb 3.2 oz (97.6 kg)  07/12/20 209 lb (94.8 kg)  03/30/20 209 lb (94.8 kg)   Diabetic Foot Exam - Simple   No data filed     Office Visit on 10/11/2020  Component Date Value Ref Range Status  . Color, UA 10/11/2020 yellow  yellow Final  . Clarity, UA 10/11/2020 clear  clear Final  . Glucose, UA 10/11/2020 negative  negative mg/dL Final  . Bilirubin, UA 10/11/2020 negative  negative Final  . Ketones, POC UA 10/11/2020 negative  negative mg/dL Final  . Spec Grav, UA 10/11/2020 1.020  1.010 - 1.025 Final  . Blood, UA 10/11/2020 negative  negative Final  . pH, UA 10/11/2020 6.0  5.0 - 8.0 Final  . POC PROTEIN,UA 10/11/2020 negative  negative,  trace Final  . Urobilinogen, UA 10/11/2020 0.2  0.2 or 1.0 E.U./dL Final  . Nitrite, UA 10/11/2020 Positive* Negative Final  . Leukocytes, UA 10/11/2020 Negative  Negative Final    There are no preventive care reminders to display for this patient.  There are no preventive care reminders to display for this patient.  Lab Results  Component Value Date   TSH 1.220 11/06/2019   Lab Results  Component Value Date   WBC 10.9 (H) 07/12/2020   HGB 13.0 07/12/2020   HCT 40.8 07/12/2020   MCV 80 07/12/2020   PLT 363 07/12/2020   Lab Results  Component Value Date   NA 139 07/12/2020   K 5.0 07/12/2020   CO2 24 07/12/2020   GLUCOSE 101 (H) 07/12/2020   BUN 15 07/12/2020   CREATININE 0.95 07/12/2020   BILITOT 0.3 07/12/2020   ALKPHOS 113 07/12/2020   AST 13 07/12/2020   ALT 10 07/12/2020   PROT 7.1 07/12/2020   ALBUMIN 4.3 07/12/2020   CALCIUM 9.4 07/12/2020   ANIONGAP 7  09/24/2017   Lab Results  Component Value Date   CHOL 188 07/12/2020   Lab Results  Component Value Date   HDL 62 07/12/2020   Lab Results  Component Value Date   LDLCALC 91 07/12/2020   Lab Results  Component Value Date   TRIG 211 (H) 07/12/2020   Lab Results  Component Value Date   CHOLHDL 3.0 07/12/2020   Lab Results  Component Value Date   HGBA1C 6.3 (H) 07/12/2020      Assessment & Plan:   Problem List Items Addressed This Visit      Endocrine   Type 2 diabetes mellitus with diabetic polyneuropathy (HCC)   Relevant Medications   buPROPion (WELLBUTRIN XL) 300 MG 24 hr tablet   Other Relevant Orders   CBC with Differential/Platelet   Comprehensive metabolic panel   TSH   Lipid panel   Hemoglobin A1c     Other   Depression with anxiety   Relevant Medications   buPROPion (WELLBUTRIN XL) 300 MG 24 hr tablet   Polymyalgia rheumatica (HCC)   Relevant Orders   CBC with Differential/Platelet   Comprehensive metabolic panel   Mixed hyperlipidemia   Relevant Orders   Lipid panel    Anxiety - Primary   Relevant Medications   buPROPion (WELLBUTRIN XL) 300 MG 24 hr tablet   Other Relevant Orders   TSH    Other Visit Diagnoses    Menopausal and postmenopausal disorder       Polyuria       Relevant Orders   POCT URINALYSIS DIP (CLINITEK) (Completed)   Urine Culture      Meds ordered this encounter  Medications  . buPROPion (WELLBUTRIN XL) 300 MG 24 hr tablet    Sig: TAKE 1 TABLET(300 MG) BY MOUTH DAILY    Dispense:  90 tablet    Refill:  1    Order Specific Question:   Supervising Provider    AnswerShelton Silvas    Follow-up: Return in about 3 months (around 01/10/2021) for chronic fasting.    SARA R Hartlee Amedee, PA-C

## 2020-10-12 LAB — LIPID PANEL
Chol/HDL Ratio: 3 ratio (ref 0.0–4.4)
Cholesterol, Total: 192 mg/dL (ref 100–199)
HDL: 63 mg/dL (ref >39)
LDL Chol Calc (NIH): 89 mg/dL (ref 0–99)
Triglycerides: 239 mg/dL — ABNORMAL HIGH (ref 0–149)
VLDL Cholesterol Cal: 40 mg/dL (ref 5–40)

## 2020-10-12 LAB — COMPREHENSIVE METABOLIC PANEL
ALT: 15 IU/L (ref 0–32)
AST: 19 IU/L (ref 0–40)
Albumin/Globulin Ratio: 1.6 (ref 1.2–2.2)
Albumin: 4.3 g/dL (ref 3.8–4.8)
Alkaline Phosphatase: 94 IU/L (ref 44–121)
BUN/Creatinine Ratio: 18 (ref 12–28)
BUN: 18 mg/dL (ref 8–27)
Bilirubin Total: 0.3 mg/dL (ref 0.0–1.2)
CO2: 24 mmol/L (ref 20–29)
Calcium: 9 mg/dL (ref 8.7–10.3)
Chloride: 97 mmol/L (ref 96–106)
Creatinine, Ser: 1 mg/dL (ref 0.57–1.00)
Globulin, Total: 2.7 g/dL (ref 1.5–4.5)
Glucose: 116 mg/dL — ABNORMAL HIGH (ref 65–99)
Potassium: 5.3 mmol/L — ABNORMAL HIGH (ref 3.5–5.2)
Sodium: 138 mmol/L (ref 134–144)
Total Protein: 7 g/dL (ref 6.0–8.5)
eGFR: 62 mL/min/{1.73_m2} (ref >59)

## 2020-10-12 LAB — CBC WITH DIFFERENTIAL/PLATELET
Basophils Absolute: 0.1 10*3/uL (ref 0.0–0.2)
Basos: 1 %
EOS (ABSOLUTE): 0.4 10*3/uL (ref 0.0–0.4)
Eos: 4 %
Hematocrit: 41.2 % (ref 34.0–46.6)
Hemoglobin: 13.1 g/dL (ref 11.1–15.9)
Immature Grans (Abs): 0.1 10*3/uL (ref 0.0–0.1)
Immature Granulocytes: 1 %
Lymphocytes Absolute: 2.6 10*3/uL (ref 0.7–3.1)
Lymphs: 28 %
MCH: 25.8 pg — ABNORMAL LOW (ref 26.6–33.0)
MCHC: 31.8 g/dL (ref 31.5–35.7)
MCV: 81 fL (ref 79–97)
Monocytes Absolute: 0.7 10*3/uL (ref 0.1–0.9)
Monocytes: 7 %
Neutrophils Absolute: 5.6 10*3/uL (ref 1.4–7.0)
Neutrophils: 59 %
Platelets: 319 10*3/uL (ref 150–450)
RBC: 5.07 x10E6/uL (ref 3.77–5.28)
RDW: 14.5 % (ref 11.7–15.4)
WBC: 9.4 10*3/uL (ref 3.4–10.8)

## 2020-10-12 LAB — HEMOGLOBIN A1C
Est. average glucose Bld gHb Est-mCnc: 146 mg/dL
Hgb A1c MFr Bld: 6.7 % — ABNORMAL HIGH (ref 4.8–5.6)

## 2020-10-12 LAB — TSH: TSH: 1.3 u[IU]/mL (ref 0.450–4.500)

## 2020-10-12 LAB — CARDIOVASCULAR RISK ASSESSMENT

## 2020-10-14 ENCOUNTER — Encounter: Payer: Self-pay | Admitting: Physician Assistant

## 2020-10-14 LAB — URINE CULTURE

## 2020-10-17 ENCOUNTER — Other Ambulatory Visit: Payer: Self-pay | Admitting: Physician Assistant

## 2020-10-17 MED ORDER — NITROFURANTOIN MONOHYD MACRO 100 MG PO CAPS: 100.0000 mg | ORAL_CAPSULE | Freq: Two times a day (BID) | ORAL | 0 refills | Status: DC

## 2020-10-31 ENCOUNTER — Other Ambulatory Visit: Payer: Self-pay | Admitting: Physician Assistant

## 2020-11-01 ENCOUNTER — Other Ambulatory Visit: Payer: Self-pay

## 2020-11-01 ENCOUNTER — Ambulatory Visit (INDEPENDENT_AMBULATORY_CARE_PROVIDER_SITE_OTHER): Payer: PPO

## 2020-11-01 DIAGNOSIS — N3000 Acute cystitis without hematuria: Secondary | ICD-10-CM | POA: Diagnosis not present

## 2020-11-01 LAB — POCT URINALYSIS DIP (CLINITEK)
Bilirubin, UA: NEGATIVE
Blood, UA: NEGATIVE
Glucose, UA: NEGATIVE mg/dL
Ketones, POC UA: NEGATIVE mg/dL
Leukocytes, UA: NEGATIVE
Nitrite, UA: NEGATIVE
Spec Grav, UA: 1.02 (ref 1.010–1.025)
Urobilinogen, UA: 0.2 E.U./dL
pH, UA: 6 (ref 5.0–8.0)

## 2020-11-01 NOTE — Progress Notes (Signed)
Patient came in today for urine recheck.  She has completed the ABT and denies any symptoms.  Patient aware of results.

## 2020-11-19 ENCOUNTER — Other Ambulatory Visit: Payer: Self-pay | Admitting: Physician Assistant

## 2020-11-28 ENCOUNTER — Other Ambulatory Visit: Payer: Self-pay | Admitting: Physician Assistant

## 2021-01-11 ENCOUNTER — Ambulatory Visit: Payer: PPO | Admitting: Physician Assistant

## 2021-01-25 ENCOUNTER — Other Ambulatory Visit: Payer: Self-pay | Admitting: Family Medicine

## 2021-01-25 ENCOUNTER — Other Ambulatory Visit: Payer: Self-pay | Admitting: Physician Assistant

## 2021-01-27 ENCOUNTER — Other Ambulatory Visit: Payer: Self-pay

## 2021-01-27 ENCOUNTER — Encounter: Payer: Self-pay | Admitting: Physician Assistant

## 2021-01-27 ENCOUNTER — Ambulatory Visit (INDEPENDENT_AMBULATORY_CARE_PROVIDER_SITE_OTHER): Payer: PPO | Admitting: Physician Assistant

## 2021-01-27 VITALS — BP 122/80 | HR 96 | Temp 97.5°F | Ht 63.0 in | Wt 225.8 lb

## 2021-01-27 DIAGNOSIS — E782 Mixed hyperlipidemia: Secondary | ICD-10-CM

## 2021-01-27 DIAGNOSIS — F418 Other specified anxiety disorders: Secondary | ICD-10-CM | POA: Diagnosis not present

## 2021-01-27 DIAGNOSIS — Z1231 Encounter for screening mammogram for malignant neoplasm of breast: Secondary | ICD-10-CM | POA: Diagnosis not present

## 2021-01-27 DIAGNOSIS — E1142 Type 2 diabetes mellitus with diabetic polyneuropathy: Secondary | ICD-10-CM | POA: Diagnosis not present

## 2021-01-27 DIAGNOSIS — M353 Polymyalgia rheumatica: Secondary | ICD-10-CM

## 2021-01-27 DIAGNOSIS — E559 Vitamin D deficiency, unspecified: Secondary | ICD-10-CM

## 2021-01-27 MED ORDER — ICOSAPENT ETHYL 1 G PO CAPS: ORAL_CAPSULE | ORAL | 3 refills | Status: DC

## 2021-01-27 NOTE — Progress Notes (Signed)
Established Patient Office Visit  Subjective:  Patient ID: Madison Stein, female    DOB: 1951/02/11  Age: 70 y.o. MRN: [id number]  CC:  Chief Complaint  Patient presents with   Diabetes   Hyperlipidemia    HPI Madison Stein presents for follow up diabetes   Patient presents with type 2 diabetes mellitus with diabetic polyneuropathy.  Specifically, this is type 2, non-insulin requiring diabetes without complications.  Date of diagnosis 08/2011.  Compliance with treatment has been fair; she skips some medication doses.  Primary symptoms reported include nerve dysfunction (manifested as burning ).  She specifically denies blurred vision, fatigue, headache, hypoglycemia, leg cramps, nocturia, excessive thirst or frequent urination.  States her fasting glucose ranging in 130s - currently on amaryl $RemoveB'4mg'kfUrlDyc$  and janumet 100/1000    Pt presents with hyperlipidemia.  Date of diagnosis 06/2012.  Current treatment includes Vascepa.  Compliance with treatment has been good; maintains her low cholesterol diet.  She denies experiencing any hypercholesterolemia related symptoms.  Concurrent health problems include diabetes.  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  Her symptom complex includes apprehension. Current treatment includes Cymbalta $RemoveBeforeDEI'60mg'hRqtTUeBeMyfHsHJ$  and ativan 0.$RemoveBefo'5mg'RksRHlQHOBf$  qd.  and also wellbutrin XL $RemoveBefor'300mg'iFsYMVvyOFnA$  qdShe denies pertinent past medical history.  Family history is pertinent for anxiety and depression.      Polymyalgia rheumatica details; this was diagnosed years ago.  Compliance with treatment has been good; Madison Stein takes her medication as directed, maintains her diet and exercise regimen, and follows up as directed.  Primary complaints include joint stiffness. She denies associated swelling, redness, warmth, deformity or myalgias.  The pattern of joint symptomatology has been stable and nonprogressive.   Primary joints affected include the cervical and lumbar spine and knees.  Support mechanisms include family, friends, and husband.  Pertinent medical history is remarkable for polymyalgia rheumatica.  Family history is remarkable for osteoarthritis and rheumatoid arthritis.  Currently on Neurontin 300 mg 1 QAM and 2 QPM, Cymbalta 60 mg BID    Past Medical History:  Diagnosis Date   Anxiety    Arthritis    Bronchitis 8309   Hx of   Complication of anesthesia    Depression    takes Cymbalta daily   Depression    Depression with anxiety    Diabetes mellitus without complication (HCC)    takes Metformin daily   Diabetes mellitus without complication (HCC)    Type II   Family history of adverse reaction to anesthesia    patients mother and sister gets sick   Family history of anesthesia complication    mom and sister get very sick   Fibromyalgia    History of bronchitis 48yrs ago   History of shingles    Hyperlipidemia    takes Fish Oil bid   Hyperlipidemia    Insomnia    has Ambien if needed   Joint pain    Nontoxic (diffuse) goiter    Peripheral neuropathy    takes Lyrica daily   Pneumonia 63yrs ago   hx of   Pneumonia    hx of   Polymyalgia rheumatica (HCC)    PONV (postoperative nausea and vomiting)    Pure hyperglyceridemia    Thyroid nodule    Type 2 diabetes mellitus with diabetic polyneuropathy (HCC)    Urgency of urination    Leakage wears pad    Past  Surgical History:  Procedure Laterality Date   BACK SURGERY  1989   lumbar disc   CARPAL TUNNEL RELEASE Right 1985   CATARACT EXTRACTION  2020   CERVICAL FUSION  2009   CHOLECYSTECTOMY  2007   COLONOSCOPY     ESOPHAGOGASTRODUODENOSCOPY     EYE SURGERY Bilateral 2007   laser eye surgery    HAMMER TOE SURGERY  2019   HIP ARTHROPLASTY Bilateral    REFRACTIVE SURGERY  2008   TOTAL HIP ARTHROPLASTY Right 11/20/2013   Procedure: RIGHT TOTAL HIP ARTHROPLASTY;  Surgeon: Kerin Salen, MD;  Location: [facility];   Service: Orthopedics;  Laterality: Right;   TOTAL HIP ARTHROPLASTY Left 08/30/2014   Procedure: TOTAL HIP ARTHROPLASTY;  Surgeon: Kerin Salen, MD;  Location: [facility];  Service: Orthopedics;  Laterality: Left;   TOTAL KNEE ARTHROPLASTY Left 06/06/2015   Procedure: TOTAL KNEE ARTHROPLASTY;  Surgeon: Frederik Pear, MD;  Location: [facility];  Service: Orthopedics;  Laterality: Left;   TOTAL KNEE ARTHROPLASTY Right 09/23/2017   Procedure: TOTAL KNEE ARTHROPLASTY;  Surgeon: Frederik Pear, MD;  Location: [facility];  Service: Orthopedics;  Laterality: Right;   TUBAL LIGATION  1987   TUMOR REMOVAL Right    Hand   tumor removed from right hand  2011   ulnar nerve release Left 1996    Family History  Problem Relation Age of Onset   Alzheimer's disease Mother    Kidney failure Father    Diabetes Father    Asthma Daughter    Healthy Daughter     Social History   Socioeconomic History   Marital status: Divorced    Spouse name: Not on file   Number of children: 1   Years of education: Not on file   Highest education level: Some college, no degree  Occupational History   Not on file  Tobacco Use   Smoking status: Former   Smokeless tobacco: Never   Tobacco comments:    quit smoking in 1997  Vaping Use   Vaping Use: Never used  Substance and Sexual Activity   Alcohol use: Not Currently    Comment: rare beer   Drug use: No   Sexual activity: Never    Birth control/protection: Post-menopausal  Other Topics Concern   Not on file  Social History Narrative   Lives alone   Left handed   Caffeine: at the most 2 cups of coffee/day      ** Merged History Encounter **       Social Determinants of Health   Financial Resource Strain: Not on file  Food Insecurity: Not on file  Transportation Needs: Not on file  Physical Activity: Not on file  Stress: Not on file  Social Connections: Not on file  Intimate Partner Violence: Not on file     Current Outpatient Medications:    acetaminophen  (TYLENOL) 650 MG CR tablet, Take 650-1,300 mg by mouth 2 (two) times daily as needed for pain., Disp: , Rfl:    aspirin EC 81 MG tablet, Take 1 tablet (81 mg total) by mouth daily., Disp: 30 tablet, Rfl: 0   buPROPion (WELLBUTRIN XL) 300 MG 24 hr tablet, TAKE 1 TABLET(300 MG) BY MOUTH DAILY, Disp: 90 tablet, Rfl: 1   Continuous Blood Gluc Receiver (FREESTYLE LIBRE 14 DAY READER) DEVI, none, Disp: 1 each, Rfl: 0   Continuous Blood Gluc Sensor (FREESTYLE LIBRE 14 DAY SENSOR) MISC, none, Disp: 1 each, Rfl: 2   DULoxetine (CYMBALTA) 60 MG capsule,  TAKE 1 CAPSULE(60 MG) BY MOUTH TWICE DAILY, Disp: 180 capsule, Rfl: 0   FREESTYLE LITE test strip, CHECK BLOOD SUGAR EVERY DAY, Disp: 100 strip, Rfl: 1   gabapentin (NEURONTIN) 300 MG capsule, TAKE 1 CAPSULE BY MOUTH EVERY MORNING, AND 2 CAPSULES EVERY NIGHT AT BEDTIME, Disp: 90 capsule, Rfl: 1   glimepiride (AMARYL) 4 MG tablet, Take 1 tablet (4 mg total) by mouth daily., Disp: 90 tablet, Rfl: 0   methocarbamol (ROBAXIN) 500 MG tablet, TAKE 1 TABLET(500 MG) BY MOUTH DAILY AS NEEDED FOR MUSCLE SPASMS, Disp: 30 tablet, Rfl: 3   Multiple Vitamin (MULTIVITAMIN WITH MINERALS) TABS tablet, Take 1 tablet by mouth daily., Disp: , Rfl:    omeprazole (PRILOSEC) 40 MG capsule, TAKE 1 CAPSULE(40 MG) BY MOUTH DAILY, Disp: 90 capsule, Rfl: 0   rizatriptan (MAXALT-MLT) 10 MG disintegrating tablet, DISSOLVE 1 TABLET ON TONGUE, MAY REPEAT AT 2 HOURS INTERVALS IF NEEDED. MAXIMUM OF 3 TABLETS IN 24 HOURS, Disp: 18 tablet, Rfl: 2   SitaGLIPtin-MetFORMIN HCl (JANUMET XR) [id number] MG TB24, Take 1 tablet by mouth daily with supper., Disp: 90 tablet, Rfl: 0   icosapent Ethyl (VASCEPA) 1 g capsule, TAKE 2 CAPSULES BY MOUTH TWICE DAILY WITH FOOD, Disp: 120 capsule, Rfl: 3   Allergies  Allergen Reactions   Morphine And Related Nausea And Vomiting    "just don't tolerate it well"   Zestril [Lisinopril] Cough    ROS CONSTITUTIONAL: Negative for chills, fatigue, fever,  unintentional weight gain and unintentional weight loss.  CARDIOVASCULAR: Negative for chest pain, dizziness, palpitations and pedal edema.  RESPIRATORY: Negative for recent cough and dyspnea.  GASTROINTESTINAL: Negative for abdominal pain, acid reflux symptoms, constipation, diarrhea, nausea and vomiting.  MSK: see HPI INTEGUMENTARY: Negative for rash.  NEUROLOGICAL: Negative for dizziness and headaches.  PSYCHIATRIC: Negative for sleep disturbance and to question depression screen.  Negative for depression, negative for anhedonia.        Objective:    PHYSICAL EXAM:   VS: BP 122/80 (BP Location: Right Arm, Patient Position: Sitting, Cuff Size: Normal)   Pulse 96   Temp (!) 97.5 F (36.4 C) (Temporal)   Ht $R'5\' 3"'hN$  (1.6 m)   Wt 225 lb 12.8 oz (102.4 kg)   SpO2 98%   BMI 40.00 kg/m   GEN: Well nourished, well developed, in no acute distress  Cardiac: RRR; no murmurs, rubs, or gallops,no edema -  Respiratory:  normal respiratory rate and pattern with no distress - normal breath sounds with no rales, rhonchi, wheezes or rubs MS: no deformity or atrophy  Skin: warm and dry, no rash  Psych: euthymic mood, appropriate affect and demeanor    BP 122/80 (BP Location: Right Arm, Patient Position: Sitting, Cuff Size: Normal)   Pulse 96   Temp (!) 97.5 F (36.4 C) (Temporal)   Ht $R'5\' 3"'Cm$  (1.6 m)   Wt 225 lb 12.8 oz (102.4 kg)   SpO2 98%   BMI 40.00 kg/m  Wt Readings from Last 3 Encounters:  01/27/21 225 lb 12.8 oz (102.4 kg)  10/11/20 215 lb 3.2 oz (97.6 kg)  07/12/20 209 lb (94.8 kg)   Diabetic Foot Exam - Simple   No data filed     No visits with results within 1 Day(s) from this visit.  Latest known visit with results is:  Clinical Support on 11/01/2020  Component Date Value Ref Range Status   Color, UA 11/01/2020 yellow  yellow Final   Clarity, UA 11/01/2020 clear  clear Final  Glucose, UA 11/01/2020 negative  negative mg/dL Final   Bilirubin, UA 11/01/2020 negative   negative Final   Ketones, POC UA 11/01/2020 negative  negative mg/dL Final   Spec Grav, UA 11/01/2020 1.020  1.010 - 1.025 Final   Blood, UA 11/01/2020 negative  negative Final   pH, UA 11/01/2020 6.0  5.0 - 8.0 Final   POC PROTEIN,UA 11/01/2020 trace  negative, trace Final   Urobilinogen, UA 11/01/2020 0.2  0.2 or 1.0 E.U./dL Final   Nitrite, UA 11/01/2020 Negative  Negative Final   Leukocytes, UA 11/01/2020 Negative  Negative Final    Health Maintenance Due  Topic Date Due   Zoster Vaccines- Shingrix (1 of 2) Never done   MAMMOGRAM  04/13/2020   FOOT EXAM  11/05/2020   OPHTHALMOLOGY EXAM  11/06/2020   COVID-19 Vaccine (4 - Booster for Moderna series) 11/09/2020   URINE MICROALBUMIN  03/30/2021    There are no preventive care reminders to display for this patient.  Lab Results  Component Value Date   TSH 1.300 10/11/2020   Lab Results  Component Value Date   WBC 9.4 10/11/2020   HGB 13.1 10/11/2020   HCT 41.2 10/11/2020   MCV 81 10/11/2020   PLT 319 10/11/2020   Lab Results  Component Value Date   NA 138 10/11/2020   K 5.3 (H) 10/11/2020   CO2 24 10/11/2020   GLUCOSE 116 (H) 10/11/2020   BUN 18 10/11/2020   CREATININE 1.00 10/11/2020   BILITOT 0.3 10/11/2020   ALKPHOS 94 10/11/2020   AST 19 10/11/2020   ALT 15 10/11/2020   PROT 7.0 10/11/2020   ALBUMIN 4.3 10/11/2020   CALCIUM 9.0 10/11/2020   ANIONGAP 7 09/24/2017   EGFR 62 10/11/2020   Lab Results  Component Value Date   CHOL 192 10/11/2020   Lab Results  Component Value Date   HDL 63 10/11/2020   Lab Results  Component Value Date   LDLCALC 89 10/11/2020   Lab Results  Component Value Date   TRIG 239 (H) 10/11/2020   Lab Results  Component Value Date   CHOLHDL 3.0 10/11/2020   Lab Results  Component Value Date   HGBA1C 6.7 (H) 10/11/2020      Assessment & Plan:   Problem List Items Addressed This Visit       Endocrine   Type 2 diabetes mellitus with diabetic polyneuropathy ([place])  - Primary   Relevant Orders   CBC with Differential/Platelet   Comprehensive metabolic panel   TSH   Lipid panel   Hemoglobin A1c Continue meds and watch diet     Other   Mixed hyperlipidemia   Relevant Medications   icosapent Ethyl (VASCEPA) 1 g capsule   Other Relevant Orders   Comprehensive metabolic panel   Lipid panel Low chol diet  Anxiety withdepression Continue current meds as directed   Other Visit Diagnoses     Vitamin D insufficiency       Relevant Orders   VITAMIN D 25 Hydroxy (Vit-D Deficiency, Fractures)   Encounter for screening mammogram for breast cancer       Relevant Orders   MM DIGITAL SCREENING BILATERAL       Meds ordered this encounter  Medications   icosapent Ethyl (VASCEPA) 1 g capsule    Sig: TAKE 2 CAPSULES BY MOUTH TWICE DAILY WITH FOOD    Dispense:  120 capsule    Refill:  3    Order Specific Question:   Supervising Provider  AnswerRochel Brome [[id number]]    Follow-up: Return in about 3 months (around 04/29/2021) for chronic fasting.    SARA R Madison Fess, PA-C

## 2021-01-28 LAB — CBC WITH DIFFERENTIAL/PLATELET
Basophils Absolute: 0.1 10*3/uL (ref 0.0–0.2)
Basos: 1 %
EOS (ABSOLUTE): 0.4 10*3/uL (ref 0.0–0.4)
Eos: 4 %
Hematocrit: 41.4 % (ref 34.0–46.6)
Hemoglobin: 13.2 g/dL (ref 11.1–15.9)
Immature Grans (Abs): 0.1 10*3/uL (ref 0.0–0.1)
Immature Granulocytes: 1 %
Lymphocytes Absolute: 3.6 10*3/uL — ABNORMAL HIGH (ref 0.7–3.1)
Lymphs: 33 %
MCH: 26 pg — ABNORMAL LOW (ref 26.6–33.0)
MCHC: 31.9 g/dL (ref 31.5–35.7)
MCV: 82 fL (ref 79–97)
Monocytes Absolute: 0.8 10*3/uL (ref 0.1–0.9)
Monocytes: 8 %
Neutrophils Absolute: 5.8 10*3/uL (ref 1.4–7.0)
Neutrophils: 53 %
Platelets: 358 10*3/uL (ref 150–450)
RBC: 5.07 x10E6/uL (ref 3.77–5.28)
RDW: 14.2 % (ref 11.7–15.4)
WBC: 10.8 10*3/uL (ref 3.4–10.8)

## 2021-01-28 LAB — LIPID PANEL
Chol/HDL Ratio: 3 ratio (ref 0.0–4.4)
Cholesterol, Total: 170 mg/dL (ref 100–199)
HDL: 56 mg/dL (ref >39)
LDL Chol Calc (NIH): 81 mg/dL (ref 0–99)
Triglycerides: 200 mg/dL — ABNORMAL HIGH (ref 0–149)
VLDL Cholesterol Cal: 33 mg/dL (ref 5–40)

## 2021-01-28 LAB — COMPREHENSIVE METABOLIC PANEL
ALT: 17 IU/L (ref 0–32)
AST: 17 IU/L (ref 0–40)
Albumin/Globulin Ratio: 1.8 (ref 1.2–2.2)
Albumin: 4.4 g/dL (ref 3.8–4.8)
Alkaline Phosphatase: 97 IU/L (ref 44–121)
BUN/Creatinine Ratio: 17 (ref 12–28)
BUN: 17 mg/dL (ref 8–27)
Bilirubin Total: 0.2 mg/dL (ref 0.0–1.2)
CO2: 23 mmol/L (ref 20–29)
Calcium: 9.6 mg/dL (ref 8.7–10.3)
Chloride: 102 mmol/L (ref 96–106)
Creatinine, Ser: 1.02 mg/dL — ABNORMAL HIGH (ref 0.57–1.00)
Globulin, Total: 2.4 g/dL (ref 1.5–4.5)
Glucose: 167 mg/dL — ABNORMAL HIGH (ref 65–99)
Potassium: 5.3 mmol/L — ABNORMAL HIGH (ref 3.5–5.2)
Sodium: 141 mmol/L (ref 134–144)
Total Protein: 6.8 g/dL (ref 6.0–8.5)
eGFR: 60 mL/min/{1.73_m2} (ref >59)

## 2021-01-28 LAB — HEMOGLOBIN A1C
Est. average glucose Bld gHb Est-mCnc: 146 mg/dL
Hgb A1c MFr Bld: 6.7 % — ABNORMAL HIGH (ref 4.8–5.6)

## 2021-01-28 LAB — TSH: TSH: 1.86 u[IU]/mL (ref 0.450–4.500)

## 2021-01-28 LAB — VITAMIN D 25 HYDROXY (VIT D DEFICIENCY, FRACTURES): Vit D, 25-Hydroxy: 23.6 ng/mL — ABNORMAL LOW (ref 30.0–100.0)

## 2021-01-28 LAB — CARDIOVASCULAR RISK ASSESSMENT

## 2021-01-30 ENCOUNTER — Other Ambulatory Visit: Payer: Self-pay | Admitting: Physician Assistant

## 2021-01-30 MED ORDER — VITAMIN D (ERGOCALCIFEROL) 1.25 MG (50000 UNIT) PO CAPS: 50000.0000 [IU] | ORAL_CAPSULE | ORAL | 5 refills | Status: DC

## 2021-01-31 ENCOUNTER — Other Ambulatory Visit: Payer: Self-pay | Admitting: Physician Assistant

## 2021-02-02 ENCOUNTER — Telehealth: Payer: Self-pay | Admitting: Physician Assistant

## 2021-02-02 NOTE — Telephone Encounter (Signed)
   Madison Stein has been scheduled for the following appointment:  WHAT: SCREENING MAMMOGRAM WHERE: [facility] DATE: 02/13/21 TIME: 10:10 AM ARRIVAL TIME  Patient has been made aware.

## 2021-02-16 NOTE — Telephone Encounter (Signed)
Pt no showed mammogram appt

## 2021-02-24 ENCOUNTER — Other Ambulatory Visit: Payer: Self-pay | Admitting: Physician Assistant

## 2021-02-24 DIAGNOSIS — E1142 Type 2 diabetes mellitus with diabetic polyneuropathy: Secondary | ICD-10-CM

## 2021-03-18 ENCOUNTER — Ambulatory Visit (INDEPENDENT_AMBULATORY_CARE_PROVIDER_SITE_OTHER): Payer: PPO

## 2021-03-18 DIAGNOSIS — Z0000 Encounter for general adult medical examination without abnormal findings: Secondary | ICD-10-CM

## 2021-03-18 NOTE — Progress Notes (Signed)
Patient didn't answer phone for visit.

## 2021-03-27 ENCOUNTER — Other Ambulatory Visit: Payer: Self-pay | Admitting: Family Medicine

## 2021-05-01 ENCOUNTER — Ambulatory Visit: Payer: PPO | Admitting: Physician Assistant

## 2021-05-02 ENCOUNTER — Other Ambulatory Visit: Payer: Self-pay | Admitting: Physician Assistant

## 2021-05-02 DIAGNOSIS — F419 Anxiety disorder, unspecified: Secondary | ICD-10-CM

## 2021-05-05 ENCOUNTER — Other Ambulatory Visit: Payer: Self-pay | Admitting: Physician Assistant

## 2021-05-05 DIAGNOSIS — F419 Anxiety disorder, unspecified: Secondary | ICD-10-CM

## 2021-05-10 ENCOUNTER — Other Ambulatory Visit: Payer: Self-pay

## 2021-05-10 ENCOUNTER — Encounter: Payer: Self-pay | Admitting: Physician Assistant

## 2021-05-10 ENCOUNTER — Ambulatory Visit (INDEPENDENT_AMBULATORY_CARE_PROVIDER_SITE_OTHER): Payer: PPO | Admitting: Physician Assistant

## 2021-05-10 VITALS — BP 120/72 | HR 96 | Temp 97.3°F | Ht 63.0 in | Wt 228.8 lb

## 2021-05-10 DIAGNOSIS — M353 Polymyalgia rheumatica: Secondary | ICD-10-CM

## 2021-05-10 DIAGNOSIS — E559 Vitamin D deficiency, unspecified: Secondary | ICD-10-CM

## 2021-05-10 DIAGNOSIS — E1142 Type 2 diabetes mellitus with diabetic polyneuropathy: Secondary | ICD-10-CM

## 2021-05-10 DIAGNOSIS — E782 Mixed hyperlipidemia: Secondary | ICD-10-CM | POA: Diagnosis not present

## 2021-05-10 DIAGNOSIS — Z23 Encounter for immunization: Secondary | ICD-10-CM | POA: Diagnosis not present

## 2021-05-10 DIAGNOSIS — M797 Fibromyalgia: Secondary | ICD-10-CM | POA: Diagnosis not present

## 2021-05-10 DIAGNOSIS — F419 Anxiety disorder, unspecified: Secondary | ICD-10-CM | POA: Diagnosis not present

## 2021-05-10 DIAGNOSIS — R1319 Other dysphagia: Secondary | ICD-10-CM

## 2021-05-10 LAB — POCT UA - MICROALBUMIN: Albumin/Creatinine Ratio, Urine, POC: 80

## 2021-05-10 MED ORDER — JANUMET XR 100-1000 MG PO TB24: 1.0000 | ORAL_TABLET | Freq: Every day | ORAL | 2 refills | Status: DC

## 2021-05-10 NOTE — Progress Notes (Signed)
Established Patient Office Visit  Subjective:  Patient ID: Madison Stein, female    DOB: [birth year]  Age: 70 y.o. MRN: [id number]  CC:  Chief Complaint  Patient presents with   Diabetes    HPI Madison Stein presents for follow up diabetes   Patient presents with type 2 diabetes mellitus with diabetic polyneuropathy.  Specifically, this is type 2, non-insulin requiring diabetes without complications.  Date of diagnosis 08/2011.  Compliance with treatment has been fair; she skips some medication doses.  Primary symptoms reported include nerve dysfunction (manifested as burning ).  She specifically denies blurred vision, fatigue, headache, hypoglycemia, leg cramps, nocturia, excessive thirst or frequent urination.  States her fasting glucose ranging in 130s -150s- currently on amaryl $RemoveB'4mg'YUNmClko$  and janumet 100/1000 She also is on gabapentin which helps with neuropathy    Pt presents with hyperlipidemia.  Date of diagnosis 06/2012.  Current treatment includes Vascepa.  Compliance with treatment has been good; maintains her low cholesterol diet.  She denies experiencing any hypercholesterolemia related symptoms.  Concurrent health problems include diabetes.      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  Her symptom complex includes apprehension. Current treatment includes Cymbalta $RemoveBeforeDEI'60mg'coqgBbhgeoYkyixt$  and  also wellbutrin XL $RemoveBefor'300mg'YSLopRhAPYpw$  qd She denies pertinent past medical history.  Family history is pertinent for anxiety and depression.      Polymyalgia rheumatica details; this was diagnosed years ago.  Compliance with treatment has been good; Madison Stein takes her medication as directed, maintains her diet and exercise regimen, and follows up as directed.  Primary complaints include joint stiffness. She denies associated swelling, redness, warmth, deformity or myalgias.  The pattern of joint symptomatology has been stable and nonprogressive.  Primary joints affected include the  cervical and lumbar spine and knees.  Support mechanisms include family, friends, and husband.  Pertinent medical history is remarkable for polymyalgia rheumatica.  Family history is remarkable for osteoarthritis and rheumatoid arthritis.  Currently on Neurontin 300 mg 1 QAM and 2 QPM, Cymbalta 60 mg BID - she also uses robaxin  Pt with history of Vit D def - currently on supplement and due for labwork  Pt with history of GERD - stable on prilosec $RemoveBef'40mg'tRHAuqtreV$  qd however states for the past several months she has had trouble with swallowing - states she feels most of sensation when first trying to swallow - not really having food stick  Pt with history of headaches - uses maxalt as needed  Pt would like to get flu shot today  Pt missed mammogram appt - she will call to reschedule   Past Medical History:  Diagnosis Date   Anxiety    Arthritis    Bronchitis 0388   Hx of   Complication of anesthesia    Depression    takes Cymbalta daily   Depression    Depression with anxiety    Diabetes mellitus without complication (HCC)    takes Metformin daily   Diabetes mellitus without complication ([place])    Type II   Family history of adverse reaction to anesthesia    patients mother and sister gets sick   Family history of anesthesia complication    mom and sister get very sick   Fibromyalgia    History of bronchitis 71yrs ago   History of shingles    Hyperlipidemia    takes Fish Oil bid   Hyperlipidemia    Insomnia    has Ambien if needed  Joint pain    Nontoxic (diffuse) goiter    Peripheral neuropathy    takes Lyrica daily   Pneumonia 73yrs ago   hx of   Pneumonia    hx of   Polymyalgia rheumatica (HCC)    PONV (postoperative nausea and vomiting)    Pure hyperglyceridemia    Thyroid nodule    Type 2 diabetes mellitus with diabetic polyneuropathy (HCC)    Urgency of urination    Leakage wears pad    Past Surgical History:  Procedure Laterality Date   BACK SURGERY  1989    lumbar disc   CARPAL TUNNEL RELEASE Right 1985   CATARACT EXTRACTION  2020   CERVICAL FUSION  2009   CHOLECYSTECTOMY  2007   COLONOSCOPY     ESOPHAGOGASTRODUODENOSCOPY     EYE SURGERY Bilateral 2007   laser eye surgery    HAMMER TOE SURGERY  2019   HIP ARTHROPLASTY Bilateral    REFRACTIVE SURGERY  2008   TOTAL HIP ARTHROPLASTY Right 11/20/2013   Procedure: RIGHT TOTAL HIP ARTHROPLASTY;  Surgeon: Nestor Lewandowsky, MD;  Location: [facility];  Service: Orthopedics;  Laterality: Right;   TOTAL HIP ARTHROPLASTY Left 08/30/2014   Procedure: TOTAL HIP ARTHROPLASTY;  Surgeon: Nestor Lewandowsky, MD;  Location: [facility];  Service: Orthopedics;  Laterality: Left;   TOTAL KNEE ARTHROPLASTY Left 06/06/2015   Procedure: TOTAL KNEE ARTHROPLASTY;  Surgeon: Gean Birchwood, MD;  Location: [facility];  Service: Orthopedics;  Laterality: Left;   TOTAL KNEE ARTHROPLASTY Right 09/23/2017   Procedure: TOTAL KNEE ARTHROPLASTY;  Surgeon: Gean Birchwood, MD;  Location: [facility];  Service: Orthopedics;  Laterality: Right;   TUBAL LIGATION  1987   TUMOR REMOVAL Right    Hand   tumor removed from right hand  2011   ulnar nerve release Left 1996    Family History  Problem Relation Age of Onset   Alzheimer's disease Mother    Kidney failure Father    Diabetes Father    Asthma Daughter    Healthy Daughter     Social History   Socioeconomic History   Marital status: Divorced    Spouse name: Not on file   Number of children: 1   Years of education: Not on file   Highest education level: Some college, no degree  Occupational History   Not on file  Tobacco Use   Smoking status: Former   Smokeless tobacco: Never   Tobacco comments:    quit smoking in 1997  Vaping Use   Vaping Use: Never used  Substance and Sexual Activity   Alcohol use: Not Currently    Comment: rare beer   Drug use: No   Sexual activity: Never    Birth control/protection: Post-menopausal  Other Topics Concern   Not on file  Social History Narrative    Lives alone   Left handed   Caffeine: at the most 2 cups of coffee/day      ** Merged History Encounter **       Social Determinants of Health   Financial Resource Strain: Not on file  Food Insecurity: Not on file  Transportation Needs: Not on file  Physical Activity: Not on file  Stress: Not on file  Social Connections: Not on file  Intimate Partner Violence: Not on file     Current Outpatient Medications:    acetaminophen (TYLENOL) 650 MG CR tablet, Take 650-1,300 mg by mouth 2 (two) times daily as needed for pain., Disp: , Rfl:  aspirin EC 81 MG tablet, Take 1 tablet (81 mg total) by mouth daily., Disp: 30 tablet, Rfl: 0   buPROPion (WELLBUTRIN XL) 300 MG 24 hr tablet, TAKE 1 TABLET(300 MG) BY MOUTH DAILY, Disp: 90 tablet, Rfl: 1   Continuous Blood Gluc Receiver (FREESTYLE LIBRE 14 DAY READER) DEVI, none, Disp: 1 each, Rfl: 0   Continuous Blood Gluc Sensor (FREESTYLE LIBRE 14 DAY SENSOR) MISC, none, Disp: 1 each, Rfl: 2   DULoxetine (CYMBALTA) 60 MG capsule, TAKE 1 CAPSULE BY MOUTH TWICE DAILY, Disp: 180 capsule, Rfl: 0   FREESTYLE LITE test strip, CHECK BLOOD SUGAR EVERY DAY, Disp: 100 strip, Rfl: 1   gabapentin (NEURONTIN) 300 MG capsule, TAKE 1 CAPSULE BY MOUTH EVERY MORNING THEN TAKE 2 CAPSULES BY MOUTH EVERY NIGHT AT BEDTIME, Disp: 90 capsule, Rfl: 1   glimepiride (AMARYL) 4 MG tablet, Take 1 tablet (4 mg total) by mouth daily., Disp: 90 tablet, Rfl: 0   icosapent Ethyl (VASCEPA) 1 g capsule, TAKE 2 CAPSULES BY MOUTH TWICE DAILY WITH FOOD, Disp: 120 capsule, Rfl: 3   methocarbamol (ROBAXIN) 500 MG tablet, TAKE 1 TABLET(500 MG) BY MOUTH DAILY AS NEEDED FOR MUSCLE SPASMS, Disp: 30 tablet, Rfl: 3   Multiple Vitamin (MULTIVITAMIN WITH MINERALS) TABS tablet, Take 1 tablet by mouth daily., Disp: , Rfl:    omeprazole (PRILOSEC) 40 MG capsule, TAKE 1 CAPSULE(40 MG) BY MOUTH DAILY, Disp: 90 capsule, Rfl: 0   rizatriptan (MAXALT-MLT) 10 MG disintegrating tablet, DISSOLVE 1 TABLET ON  TONGUE, MAY REPEAT IN 2 HOURS IF NEEDED, MAX OF 3 IN 24 HOURS, Disp: 18 tablet, Rfl: 2   Vitamin D, Ergocalciferol, (DRISDOL) 1.25 MG (50000 UNIT) CAPS capsule, Take 1 capsule (50,000 Units total) by mouth every 7 (seven) days., Disp: 5 capsule, Rfl: 5   SitaGLIPtin-MetFORMIN HCl (JANUMET XR) [phone] MG TB24, Take 1 tablet by mouth daily with supper., Disp: 30 tablet, Rfl: 2   Allergies  Allergen Reactions   Morphine And Related Nausea And Vomiting    "just don't tolerate it well"   Zestril [Lisinopril] Cough   CONSTITUTIONAL: Negative for chills, fatigue, fever, unintentional weight gain and unintentional weight loss.  E/N/T: Negative for ear pain, nasal congestion and sore throat.  CARDIOVASCULAR: Negative for chest pain, dizziness, palpitations and pedal edema.  RESPIRATORY: Negative for recent cough and dyspnea.  GASTROINTESTINAL: see HPI MSK: see HPI INTEGUMENTARY: Negative for rash.  NEUROLOGICAL: Negative for dizziness and headaches.  PSYCHIATRIC: Negative for sleep disturbance and to question depression screen.  Negative for depression, negative for anhedonia.        Objective:   PHYSICAL EXAM:   VS: BP 120/72 (BP Location: Left Arm, Patient Position: Sitting, Cuff Size: Normal)   Pulse 96   Temp (!) 97.3 F (36.3 C) (Temporal)   Ht $R'5\' 3"'IQ$  (1.6 m)   Wt 228 lb 12.8 oz (103.8 kg)   SpO2 94%   BMI 40.53 kg/m   GEN: Well nourished, well developed, in no acute distress  Oropharynx - normal mucosa, palate, and posterior pharynx Neck: no JVD or masses - no thyromegaly Cardiac: RRR; no murmurs, rubs, or gallops,no edema -  Respiratory:  normal respiratory rate and pattern with no distress - normal breath sounds with no rales, rhonchi, wheezes or rubs MS: no deformity or atrophy  Skin: warm and dry, no rash  Neuro:  Alert and Oriented x 3,  CN II-Xii grossly intact Psych: euthymic mood, appropriate affect and demeanor   Diabetic Foot Exam - Simple  Simple Foot  Form Visual Inspection No deformities, no ulcerations, no other skin breakdown bilaterally: Yes See comments: Yes Sensation Testing Intact to touch and monofilament testing bilaterally: Yes Pulse Check Posterior Tibialis and Dorsalis pulse intact bilaterally: Yes Comments Dry skin.     No visits with results within 1 Day(s) from this visit.  Latest known visit with results is:  Office Visit on 01/27/2021  Component Date Value Ref Range Status   WBC 01/27/2021 10.8  3.4 - 10.8 x10E3/uL Final   RBC 01/27/2021 5.07  3.77 - 5.28 x10E6/uL Final   Hemoglobin 01/27/2021 13.2  11.1 - 15.9 g/dL Final   Hematocrit 72/89/3291 41.4  34.0 - 46.6 % Final   MCV 01/27/2021 82  79 - 97 fL Final   MCH 01/27/2021 26.0 (A)  26.6 - 33.0 pg Final   MCHC 01/27/2021 31.9  31.5 - 35.7 g/dL Final   RDW 15/24/5857 14.2  11.7 - 15.4 % Final   Platelets 01/27/2021 358  150 - 450 x10E3/uL Final   Neutrophils 01/27/2021 53  Not Estab. % Final   Lymphs 01/27/2021 33  Not Estab. % Final   Monocytes 01/27/2021 8  Not Estab. % Final   Eos 01/27/2021 4  Not Estab. % Final   Basos 01/27/2021 1  Not Estab. % Final   Neutrophils Absolute 01/27/2021 5.8  1.4 - 7.0 x10E3/uL Final   Lymphocytes Absolute 01/27/2021 3.6 (A)  0.7 - 3.1 x10E3/uL Final   Monocytes Absolute 01/27/2021 0.8  0.1 - 0.9 x10E3/uL Final   EOS (ABSOLUTE) 01/27/2021 0.4  0.0 - 0.4 x10E3/uL Final   Basophils Absolute 01/27/2021 0.1  0.0 - 0.2 x10E3/uL Final   Immature Granulocytes 01/27/2021 1  Not Estab. % Final   Immature Grans (Abs) 01/27/2021 0.1  0.0 - 0.1 x10E3/uL Final   Glucose 01/27/2021 167 (A)  65 - 99 mg/dL Final   BUN 16/27/2691 17  8 - 27 mg/dL Final   Creatinine, Ser 01/27/2021 1.02 (A)  0.57 - 1.00 mg/dL Final   eGFR 31/47/5293 60  >59 mL/min/1.73 Final   BUN/Creatinine Ratio 01/27/2021 17  12 - 28 Final   Sodium 01/27/2021 141  134 - 144 mmol/L Final   Potassium 01/27/2021 5.3 (A)  3.5 - 5.2 mmol/L Final   Chloride 01/27/2021  102  96 - 106 mmol/L Final   CO2 01/27/2021 23  20 - 29 mmol/L Final   Calcium 01/27/2021 9.6  8.7 - 10.3 mg/dL Final   Total Protein 97/07/2587 6.8  6.0 - 8.5 g/dL Final   Albumin 03/21/7103 4.4  3.8 - 4.8 g/dL Final   Globulin, Total 01/27/2021 2.4  1.5 - 4.5 g/dL Final   Albumin/Globulin Ratio 01/27/2021 1.8  1.2 - 2.2 Final   Bilirubin Total 01/27/2021 0.2  0.0 - 1.2 mg/dL Final   Alkaline Phosphatase 01/27/2021 97  44 - 121 IU/L Final   AST 01/27/2021 17  0 - 40 IU/L Final   ALT 01/27/2021 17  0 - 32 IU/L Final   TSH 01/27/2021 1.860  0.450 - 4.500 uIU/mL Final   Cholesterol, Total 01/27/2021 170  100 - 199 mg/dL Final   Triglycerides 14/06/557 200 (A)  0 - 149 mg/dL Final   HDL 79/78/4914 56  >39 mg/dL Final   VLDL Cholesterol Cal 01/27/2021 33  5 - 40 mg/dL Final   LDL Chol Calc (NIH) 01/27/2021 81  0 - 99 mg/dL Final   Chol/HDL Ratio 01/27/2021 3.0  0.0 - 4.4 ratio Final  Comment:                                   T. Chol/HDL Ratio                                             Men  Women                               1/2 Avg.Risk  3.4    3.3                                   Avg.Risk  5.0    4.4                                2X Avg.Risk  9.6    7.1                                3X Avg.Risk 23.4   11.0    Hgb A1c MFr Bld 01/27/2021 6.7 (A)  4.8 - 5.6 % Final   Comment:          Prediabetes: 5.7 - 6.4          Diabetes: >6.4          Glycemic control for adults with diabetes: <7.0    Est. average glucose Bld gHb Est-m* 01/27/2021 146  mg/dL Final   Vit D, 00-ZVFGRER 01/27/2021 23.6 (A)  30.0 - 100.0 ng/mL Final   Comment: Vitamin D deficiency has been defined by the Institute of Medicine and an Endocrine Society practice guideline as a level of serum 25-OH vitamin D less than 20 ng/mL (1,2). The Endocrine Society went on to further define vitamin D insufficiency as a level between 21 and 29 ng/mL (2). 1. IOM (Institute of Medicine). 2010. Dietary reference    intakes for  calcium and D. [place]: The    [employer]. 2. Holick MF, Binkley [place], Bischoff-Ferrari HA, et al.    Evaluation, treatment, and prevention of vitamin D    deficiency: an Endocrine Society clinical practice    guideline. JCEM. 2011 Jul; 96(7):1911-30.    Interpretation 01/27/2021 Note   Final   Supplemental report is available.    Health Maintenance Due  Topic Date Due   Zoster Vaccines- Shingrix (1 of 2) Never done   Pneumonia Vaccine 88+ Years old (2 - PPSV23 if available, else PCV20) 04/07/2020   MAMMOGRAM  04/13/2020   COVID-19 Vaccine (4 - Booster for Moderna series) 09/06/2020   OPHTHALMOLOGY EXAM  11/06/2020   URINE MICROALBUMIN  03/30/2021    There are no preventive care reminders to display for this patient.  Lab Results  Component Value Date   TSH 1.860 01/27/2021   Lab Results  Component Value Date   WBC 10.8 01/27/2021   HGB 13.2 01/27/2021   HCT 41.4 01/27/2021   MCV 82 01/27/2021   PLT 358 01/27/2021   Lab Results  Component Value Date   NA 141 01/27/2021   K 5.3 (H) 01/27/2021  CO2 23 01/27/2021   GLUCOSE 167 (H) 01/27/2021   BUN 17 01/27/2021   CREATININE 1.02 (H) 01/27/2021   BILITOT 0.2 01/27/2021   ALKPHOS 97 01/27/2021   AST 17 01/27/2021   ALT 17 01/27/2021   PROT 6.8 01/27/2021   ALBUMIN 4.4 01/27/2021   CALCIUM 9.6 01/27/2021   ANIONGAP 7 09/24/2017   EGFR 60 01/27/2021   Lab Results  Component Value Date   CHOL 170 01/27/2021   Lab Results  Component Value Date   HDL 56 01/27/2021   Lab Results  Component Value Date   LDLCALC 81 01/27/2021   Lab Results  Component Value Date   TRIG 200 (H) 01/27/2021   Lab Results  Component Value Date   CHOLHDL 3.0 01/27/2021   Lab Results  Component Value Date   HGBA1C 6.7 (H) 01/27/2021      Assessment & Plan:   Problem List Items Addressed This Visit       Endocrine   Type 2 diabetes mellitus with diabetic polyneuropathy ([place]) - Primary   Relevant  Orders   CBC with Differential/Platelet   Comprehensive metabolic panel   TSH   Lipid panel   Hemoglobin A1c Continue meds and watch diet     Other   Mixed hyperlipidemia   Relevant Medications   icosapent Ethyl (VASCEPA) 1 g capsule   Other Relevant Orders   Comprehensive metabolic panel   Lipid panel Low chol diet     Other Visit Diagnoses     Vitamin D insufficiency       Relevant Orders   VITAMIN D 25 Hydroxy (Vit-D Deficiency, Fractures)   Dysphagia Order barium swallow  Anxiety with depression Continue current meds  Need for flu vaccine - fluad given  GERD  Continue meds  Polymyalgia Rheumatica Continue current meds                 Meds ordered this encounter  Medications   SitaGLIPtin-MetFORMIN HCl (JANUMET XR) [phone] MG TB24    Sig: Take 1 tablet by mouth daily with supper.    Dispense:  30 tablet    Refill:  2    Order Specific Question:   Supervising Provider    Answer:   Shelton Silvas    Follow-up: Return in about 3 months (around 08/10/2021) for chronic fasting follow up.    SARA R Lautaro Koral, PA-C

## 2021-05-11 LAB — CBC WITH DIFFERENTIAL/PLATELET
Basophils Absolute: 0.1 10*3/uL (ref 0.0–0.2)
Basos: 1 %
EOS (ABSOLUTE): 0.3 10*3/uL (ref 0.0–0.4)
Eos: 3 %
Hematocrit: 40.1 % (ref 34.0–46.6)
Hemoglobin: 12.8 g/dL (ref 11.1–15.9)
Immature Grans (Abs): 0.2 10*3/uL — ABNORMAL HIGH (ref 0.0–0.1)
Immature Granulocytes: 2 %
Lymphocytes Absolute: 3.4 10*3/uL — ABNORMAL HIGH (ref 0.7–3.1)
Lymphs: 32 %
MCH: 25.8 pg — ABNORMAL LOW (ref 26.6–33.0)
MCHC: 31.9 g/dL (ref 31.5–35.7)
MCV: 81 fL (ref 79–97)
Monocytes Absolute: 0.7 10*3/uL (ref 0.1–0.9)
Monocytes: 7 %
Neutrophils Absolute: 5.9 10*3/uL (ref 1.4–7.0)
Neutrophils: 55 %
Platelets: 321 10*3/uL (ref 150–450)
RBC: 4.96 x10E6/uL (ref 3.77–5.28)
RDW: 14.3 % (ref 11.7–15.4)
WBC: 10.6 10*3/uL (ref 3.4–10.8)

## 2021-05-11 LAB — LIPID PANEL
Chol/HDL Ratio: 3.1 ratio (ref 0.0–4.4)
Cholesterol, Total: 182 mg/dL (ref 100–199)
HDL: 58 mg/dL (ref >39)
LDL Chol Calc (NIH): 97 mg/dL (ref 0–99)
Triglycerides: 155 mg/dL — ABNORMAL HIGH (ref 0–149)
VLDL Cholesterol Cal: 27 mg/dL (ref 5–40)

## 2021-05-11 LAB — COMPREHENSIVE METABOLIC PANEL
ALT: 17 IU/L (ref 0–32)
AST: 17 IU/L (ref 0–40)
Albumin/Globulin Ratio: 1.8 (ref 1.2–2.2)
Albumin: 4.4 g/dL (ref 3.8–4.8)
Alkaline Phosphatase: 110 IU/L (ref 44–121)
BUN/Creatinine Ratio: 14 (ref 12–28)
BUN: 16 mg/dL (ref 8–27)
Bilirubin Total: 0.2 mg/dL (ref 0.0–1.2)
CO2: 25 mmol/L (ref 20–29)
Calcium: 9.9 mg/dL (ref 8.7–10.3)
Chloride: 102 mmol/L (ref 96–106)
Creatinine, Ser: 1.15 mg/dL — ABNORMAL HIGH (ref 0.57–1.00)
Globulin, Total: 2.5 g/dL (ref 1.5–4.5)
Glucose: 199 mg/dL — ABNORMAL HIGH (ref 70–99)
Potassium: 5.1 mmol/L (ref 3.5–5.2)
Sodium: 140 mmol/L (ref 134–144)
Total Protein: 6.9 g/dL (ref 6.0–8.5)
eGFR: 52 mL/min/{1.73_m2} — ABNORMAL LOW (ref >59)

## 2021-05-11 LAB — CARDIOVASCULAR RISK ASSESSMENT

## 2021-05-11 LAB — VITAMIN D 25 HYDROXY (VIT D DEFICIENCY, FRACTURES): Vit D, 25-Hydroxy: 33.2 ng/mL (ref 30.0–100.0)

## 2021-05-11 LAB — HEMOGLOBIN A1C
Est. average glucose Bld gHb Est-mCnc: 200 mg/dL
Hgb A1c MFr Bld: 8.6 % — ABNORMAL HIGH (ref 4.8–5.6)

## 2021-05-11 LAB — TSH: TSH: 2.26 u[IU]/mL (ref 0.450–4.500)

## 2021-05-13 ENCOUNTER — Other Ambulatory Visit: Payer: Self-pay | Admitting: Physician Assistant

## 2021-05-13 DIAGNOSIS — Z1231 Encounter for screening mammogram for malignant neoplasm of breast: Secondary | ICD-10-CM

## 2021-05-17 DIAGNOSIS — R1310 Dysphagia, unspecified: Secondary | ICD-10-CM | POA: Diagnosis not present

## 2021-06-05 ENCOUNTER — Inpatient Hospital Stay: Admission: RE | Admit: 2021-06-05 | Payer: PPO | Source: Ambulatory Visit

## 2021-06-06 ENCOUNTER — Other Ambulatory Visit: Payer: Self-pay | Admitting: Physician Assistant

## 2021-07-17 ENCOUNTER — Other Ambulatory Visit: Payer: Self-pay | Admitting: Physician Assistant

## 2021-07-17 ENCOUNTER — Telehealth (INDEPENDENT_AMBULATORY_CARE_PROVIDER_SITE_OTHER): Payer: PPO | Admitting: Family Medicine

## 2021-07-17 ENCOUNTER — Encounter: Payer: Self-pay | Admitting: Family Medicine

## 2021-07-17 VITALS — BP 128/62 | HR 99 | Temp 97.3°F | Ht 63.0 in | Wt 230.0 lb

## 2021-07-17 DIAGNOSIS — J208 Acute bronchitis due to other specified organisms: Secondary | ICD-10-CM

## 2021-07-17 DIAGNOSIS — E782 Mixed hyperlipidemia: Secondary | ICD-10-CM

## 2021-07-17 DIAGNOSIS — J029 Acute pharyngitis, unspecified: Secondary | ICD-10-CM | POA: Diagnosis not present

## 2021-07-17 DIAGNOSIS — G4489 Other headache syndrome: Secondary | ICD-10-CM

## 2021-07-17 MED ORDER — BENZONATATE 200 MG PO CAPS
200.0000 mg | ORAL_CAPSULE | Freq: Three times a day (TID) | ORAL | 0 refills | Status: DC | PRN
Start: 2021-07-17 — End: 2021-08-29

## 2021-07-17 MED ORDER — TRIAMCINOLONE ACETONIDE 40 MG/ML IJ SUSP
80.0000 mg | Freq: Once | INTRAMUSCULAR | Status: AC
  Administered 2021-07-17: 80 mg via INTRAMUSCULAR

## 2021-07-17 MED ORDER — ALBUTEROL SULFATE HFA 108 (90 BASE) MCG/ACT IN AERS: 2.0000 | INHALATION_SPRAY | Freq: Four times a day (QID) | RESPIRATORY_TRACT | 0 refills | Status: DC | PRN

## 2021-07-17 MED ORDER — CEFTRIAXONE SODIUM 1 G IJ SOLR
1.0000 g | Freq: Once | INTRAMUSCULAR | Status: AC
  Administered 2021-07-17: 1 g via INTRAMUSCULAR

## 2021-07-17 MED ORDER — CEFDINIR 300 MG PO CAPS: 300.0000 mg | ORAL_CAPSULE | Freq: Two times a day (BID) | ORAL | 0 refills | Status: DC

## 2021-07-17 NOTE — Progress Notes (Deleted)
Subjective:  Patient ID: Madison Stein, female    DOB: 1950/08/18  Age: 71 y.o. MRN: [id number]  Chief Complaint  Patient presents with   Cough   Headache   Generalized Body Aches    HPI   Current Outpatient Medications on File Prior to Visit  Medication Sig Dispense Refill   acetaminophen (TYLENOL) 650 MG CR tablet Take 650-1,300 mg by mouth 2 (two) times daily as needed for pain.     aspirin EC 81 MG tablet Take 1 tablet (81 mg total) by mouth daily. 30 tablet 0   buPROPion (WELLBUTRIN XL) 300 MG 24 hr tablet TAKE 1 TABLET(300 MG) BY MOUTH DAILY 90 tablet 1   Continuous Blood Gluc Receiver (FREESTYLE LIBRE 14 DAY READER) DEVI none 1 each 0   Continuous Blood Gluc Sensor (FREESTYLE LIBRE 14 DAY SENSOR) MISC none 1 each 2   DULoxetine (CYMBALTA) 60 MG capsule TAKE 1 CAPSULE BY MOUTH TWICE DAILY 180 capsule 0   FREESTYLE LITE test strip CHECK BLOOD SUGAR EVERY DAY 100 strip 1   gabapentin (NEURONTIN) 300 MG capsule TAKE 1 CAPSULE BY MOUTH EVERY MORNING THEN TAKE 2 CAPSULES BY MOUTH EVERY NIGHT AT BEDTIME 90 capsule 1   glimepiride (AMARYL) 4 MG tablet Take 1 tablet (4 mg total) by mouth daily. 90 tablet 0   icosapent Ethyl (VASCEPA) 1 g capsule TAKE 2 CAPSULES BY MOUTH TWICE DAILY WITH FOOD 120 capsule 3   methocarbamol (ROBAXIN) 500 MG tablet TAKE 1 TABLET(500 MG) BY MOUTH DAILY AS NEEDED FOR MUSCLE SPASMS 30 tablet 3   Multiple Vitamin (MULTIVITAMIN WITH MINERALS) TABS tablet Take 1 tablet by mouth daily.     omeprazole (PRILOSEC) 40 MG capsule TAKE 1 CAPSULE(40 MG) BY MOUTH DAILY 90 capsule 0   rizatriptan (MAXALT-MLT) 10 MG disintegrating tablet DISSOLVE 1 TABLET ON TONGUE, MAY REPEAT IN 2 HOURS IF NEEDED, MAX OF 3 IN 24 HOURS 18 tablet 2   SitaGLIPtin-MetFORMIN HCl (JANUMET XR) [phone] MG TB24 Take 1 tablet by mouth daily with supper. 30 tablet 2   Vitamin D, Ergocalciferol, (DRISDOL) 1.25 MG (50000 UNIT) CAPS capsule Take 1 capsule (50,000 Units total) by mouth every 7 (seven)  days. 5 capsule 5   No current facility-administered medications on file prior to visit.   Past Medical History:  Diagnosis Date   Anxiety    Arthritis    Bronchitis 8937   Hx of   Complication of anesthesia    Depression    takes Cymbalta daily   Depression    Depression with anxiety    Diabetes mellitus without complication (HCC)    takes Metformin daily   Diabetes mellitus without complication ([place])    Type II   Family history of adverse reaction to anesthesia    patients mother and sister gets sick   Family history of anesthesia complication    mom and sister get very sick   Fibromyalgia    History of bronchitis 71yrs ago   History of shingles    Hyperlipidemia    takes Fish Oil bid   Hyperlipidemia    Insomnia    has Ambien if needed   Joint pain    Nontoxic (diffuse) goiter    Peripheral neuropathy    takes Lyrica daily   Pneumonia 58yrs ago   hx of   Pneumonia    hx of   Polymyalgia rheumatica (HCC)    PONV (postoperative nausea and vomiting)    Pure hyperglyceridemia  Thyroid nodule    Type 2 diabetes mellitus with diabetic polyneuropathy (HCC)    Urgency of urination    Leakage wears pad   Past Surgical History:  Procedure Laterality Date   BACK SURGERY  1989   lumbar disc   CARPAL TUNNEL RELEASE Right 1985   CATARACT EXTRACTION  2020   CERVICAL FUSION  2009   CHOLECYSTECTOMY  2007   COLONOSCOPY     ESOPHAGOGASTRODUODENOSCOPY     EYE SURGERY Bilateral 2007   laser eye surgery    HAMMER TOE SURGERY  2019   HIP ARTHROPLASTY Bilateral    REFRACTIVE SURGERY  2008   TOTAL HIP ARTHROPLASTY Right 11/20/2013   Procedure: RIGHT TOTAL HIP ARTHROPLASTY;  Surgeon: Kerin Salen, MD;  Location: [place];  Service: Orthopedics;  Laterality: Right;   TOTAL HIP ARTHROPLASTY Left 08/30/2014   Procedure: TOTAL HIP ARTHROPLASTY;  Surgeon: Kerin Salen, MD;  Location: [facility];  Service: Orthopedics;  Laterality: Left;   TOTAL KNEE ARTHROPLASTY Left 06/06/2015    Procedure: TOTAL KNEE ARTHROPLASTY;  Surgeon: Frederik Pear, MD;  Location: [place];  Service: Orthopedics;  Laterality: Left;   TOTAL KNEE ARTHROPLASTY Right 09/23/2017   Procedure: TOTAL KNEE ARTHROPLASTY;  Surgeon: Frederik Pear, MD;  Location: [place];  Service: Orthopedics;  Laterality: Right;   TUBAL LIGATION  1987   TUMOR REMOVAL Right    Hand   tumor removed from right hand  2011   ulnar nerve release Left 1996    Family History  Problem Relation Age of Onset   Alzheimer's disease Mother    Kidney failure Father    Diabetes Father    Asthma Daughter    Healthy Daughter    Social History   Socioeconomic History   Marital status: Divorced    Spouse name: Not on file   Number of children: 1   Years of education: Not on file   Highest education level: Some college, no degree  Occupational History   Not on file  Tobacco Use   Smoking status: Former   Smokeless tobacco: Never   Tobacco comments:    quit smoking in 1997  Vaping Use   Vaping Use: Never used  Substance and Sexual Activity   Alcohol use: Not Currently    Comment: rare beer   Drug use: No   Sexual activity: Never    Birth control/protection: Post-menopausal  Other Topics Concern   Not on file  Social History Narrative   Lives alone   Left handed   Caffeine: at the most 2 cups of coffee/day      ** Merged History Encounter **       Social Determinants of Health   Financial Resource Strain: Not on file  Food Insecurity: Not on file  Transportation Needs: Not on file  Physical Activity: Not on file  Stress: Not on file  Social Connections: Not on file    Review of Systems   Objective:  There were no vitals taken for this visit.  BP/Weight 05/10/2021 12/28/2977 02/14/2118  Systolic BP 417 408 144  Diastolic BP 72 80 62  Wt. (Lbs) 228.8 225.8 215.2  BMI 40.53 40 38.12    Physical Exam  Diabetic Foot Exam - Simple   No data filed      Lab Results  Component Value Date   WBC 10.6 05/10/2021    HGB 12.8 05/10/2021   HCT 40.1 05/10/2021   PLT 321 05/10/2021   GLUCOSE 199 (H) 05/10/2021  CHOL 182 05/10/2021   TRIG 155 (H) 05/10/2021   HDL 58 05/10/2021   LDLCALC 97 05/10/2021   ALT 17 05/10/2021   AST 17 05/10/2021   NA 140 05/10/2021   K 5.1 05/10/2021   CL 102 05/10/2021   CREATININE 1.15 (H) 05/10/2021   BUN 16 05/10/2021   CO2 25 05/10/2021   TSH 2.260 05/10/2021   INR 0.98 09/12/2017   HGBA1C 8.6 (H) 05/10/2021   MICROALBUR 10 03/30/2020      Assessment & Plan:   Problem List Items Addressed This Visit   None .  No orders of the defined types were placed in this encounter.   No orders of the defined types were placed in this encounter.    Follow-up: No follow-ups on file.  An After Visit Summary was printed and given to the patient.  Rochel Brome, MD [facility] [phone]

## 2021-07-17 NOTE — Progress Notes (Signed)
Virtual Visit via Video Note   This visit type was conducted due to national recommendations for restrictions regarding the COVID-19 Pandemic (e.g. social distancing) in an effort to limit this patient's exposure and mitigate transmission in our community.  Due to her co-morbid illnesses, this patient is at least at moderate risk for complications without adequate follow up.  This format is felt to be most appropriate for this patient at this time.  All issues noted in this document were discussed and addressed.  A limited physical exam was performed with this format.  A verbal consent was obtained for the virtual visit.   Date:  07/19/2021   ID:  Madison Stein, DOB [birth year], MRN [id number]  Patient Location: Home Provider Location: Home Office  PCP:  Marge Duncans, PA-C   Evaluation Performed:  SIck  Chief Complaint:  Cough  History of Present Illness:    Madison Stein is a 71 y.o. female with complains of body aches, Headaches, fever, and has sores in her mouth that started Saturday. Patient has taking tylenol for her headaches and fever.  The patient does have symptoms concerning for COVID-19 infection (fever, chills, cough, or new shortness of breath).    Past Medical History:  Diagnosis Date   Anxiety    Arthritis    Bronchitis 1660   Hx of   Complication of anesthesia    Depression    takes Cymbalta daily   Depression    Depression with anxiety    Diabetes mellitus without complication ([place])    takes Metformin daily   Diabetes mellitus without complication ([place])    Type II   Family history of adverse reaction to anesthesia    patients mother and sister gets sick   Family history of anesthesia complication    mom and sister get very sick   Fibromyalgia    History of bronchitis 64yrs ago   History of shingles    Hyperlipidemia    takes Fish Oil bid   Hyperlipidemia    Insomnia    has Ambien if needed   Joint pain    Nontoxic (diffuse) goiter    Peripheral  neuropathy    takes Lyrica daily   Pneumonia 38yrs ago   hx of   Pneumonia    hx of   Polymyalgia rheumatica (HCC)    PONV (postoperative nausea and vomiting)    Pure hyperglyceridemia    Thyroid nodule    Type 2 diabetes mellitus with diabetic polyneuropathy (HCC)    Urgency of urination    Leakage wears pad    Past Surgical History:  Procedure Laterality Date   BACK SURGERY  1989   lumbar disc   CARPAL TUNNEL RELEASE Right 1985   CATARACT EXTRACTION  2020   CERVICAL FUSION  2009   CHOLECYSTECTOMY  2007   COLONOSCOPY     ESOPHAGOGASTRODUODENOSCOPY     EYE SURGERY Bilateral 2007   laser eye surgery    HAMMER TOE SURGERY  2019   HIP ARTHROPLASTY Bilateral    REFRACTIVE SURGERY  2008   TOTAL HIP ARTHROPLASTY Right 11/20/2013   Procedure: RIGHT TOTAL HIP ARTHROPLASTY;  Surgeon: Kerin Salen, MD;  Location: [facility];  Service: Orthopedics;  Laterality: Right;   TOTAL HIP ARTHROPLASTY Left 08/30/2014   Procedure: TOTAL HIP ARTHROPLASTY;  Surgeon: Kerin Salen, MD;  Location: [facility];  Service: Orthopedics;  Laterality: Left;   TOTAL KNEE ARTHROPLASTY Left 06/06/2015   Procedure: TOTAL KNEE ARTHROPLASTY;  Surgeon:  Frederik Pear, MD;  Location: [facility];  Service: Orthopedics;  Laterality: Left;   TOTAL KNEE ARTHROPLASTY Right 09/23/2017   Procedure: TOTAL KNEE ARTHROPLASTY;  Surgeon: Frederik Pear, MD;  Location: [facility];  Service: Orthopedics;  Laterality: Right;   TUBAL LIGATION  1987   TUMOR REMOVAL Right    Hand   tumor removed from right hand  2011   ulnar nerve release Left 1996    Family History  Problem Relation Age of Onset   Alzheimer's disease Mother    Kidney failure Father    Diabetes Father    Asthma Daughter    Healthy Daughter     Social History   Socioeconomic History   Marital status: Divorced    Spouse name: Not on file   Number of children: 1   Years of education: Not on file   Highest education level: Some college, no degree  Occupational History   Not  on file  Tobacco Use   Smoking status: Former   Smokeless tobacco: Never   Tobacco comments:    quit smoking in 1997  Vaping Use   Vaping Use: Never used  Substance and Sexual Activity   Alcohol use: Not Currently    Comment: rare beer   Drug use: No   Sexual activity: Never    Birth control/protection: Post-menopausal  Other Topics Concern   Not on file  Social History Narrative   Lives alone   Left handed   Caffeine: at the most 2 cups of coffee/day      ** Merged History Encounter **       Social Determinants of Health   Financial Resource Strain: Not on file  Food Insecurity: Not on file  Transportation Needs: Not on file  Physical Activity: Not on file  Stress: Not on file  Social Connections: Not on file  Intimate Partner Violence: Not on file    Outpatient Medications Prior to Visit  Medication Sig Dispense Refill   acetaminophen (TYLENOL) 650 MG CR tablet Take 650-1,300 mg by mouth 2 (two) times daily as needed for pain.     aspirin EC 81 MG tablet Take 1 tablet (81 mg total) by mouth daily. 30 tablet 0   buPROPion (WELLBUTRIN XL) 300 MG 24 hr tablet TAKE 1 TABLET(300 MG) BY MOUTH DAILY 90 tablet 1   Continuous Blood Gluc Receiver (FREESTYLE LIBRE 14 DAY READER) DEVI none 1 each 0   Continuous Blood Gluc Sensor (FREESTYLE LIBRE 14 DAY SENSOR) MISC none 1 each 2   DULoxetine (CYMBALTA) 60 MG capsule TAKE 1 CAPSULE BY MOUTH TWICE DAILY 180 capsule 0   FREESTYLE LITE test strip CHECK BLOOD SUGAR EVERY DAY 100 strip 1   gabapentin (NEURONTIN) 300 MG capsule TAKE 1 CAPSULE BY MOUTH EVERY MORNING THEN TAKE 2 CAPSULES BY MOUTH EVERY NIGHT AT BEDTIME 90 capsule 1   glimepiride (AMARYL) 4 MG tablet Take 1 tablet (4 mg total) by mouth daily. 90 tablet 0   methocarbamol (ROBAXIN) 500 MG tablet TAKE 1 TABLET(500 MG) BY MOUTH DAILY AS NEEDED FOR MUSCLE SPASMS 30 tablet 3   Multiple Vitamin (MULTIVITAMIN WITH MINERALS) TABS tablet Take 1 tablet by mouth daily.     omeprazole  (PRILOSEC) 40 MG capsule TAKE 1 CAPSULE(40 MG) BY MOUTH DAILY 90 capsule 0   rizatriptan (MAXALT-MLT) 10 MG disintegrating tablet DISSOLVE 1 TABLET ON TONGUE, MAY REPEAT IN 2 HOURS IF NEEDED, MAX OF 3 IN 24 HOURS 18 tablet 2   SitaGLIPtin-MetFORMIN HCl (JANUMET  XR) [phone] MG TB24 Take 1 tablet by mouth daily with supper. 30 tablet 2   Vitamin D, Ergocalciferol, (DRISDOL) 1.25 MG (50000 UNIT) CAPS capsule Take 1 capsule (50,000 Units total) by mouth every 7 (seven) days. 5 capsule 5   icosapent Ethyl (VASCEPA) 1 g capsule TAKE 2 CAPSULES BY MOUTH TWICE DAILY WITH FOOD 120 capsule 3   No facility-administered medications prior to visit.    Allergies:   Morphine and related and Zestril [lisinopril]   Social History   Tobacco Use   Smoking status: Former   Smokeless tobacco: Never   Tobacco comments:    quit smoking in 1997  Vaping Use   Vaping Use: Never used  Substance Use Topics   Alcohol use: Not Currently    Comment: rare beer   Drug use: No     Review of Systems  Constitutional:  Positive for fever and malaise/fatigue.  HENT:  Positive for congestion. Negative for ear pain and sore throat.   Respiratory:  Positive for cough. Negative for shortness of breath.   Cardiovascular:  Negative for chest pain and palpitations.  Gastrointestinal:  Negative for abdominal pain, diarrhea, nausea and vomiting.  Neurological:  Positive for headaches.    Labs/Other Tests and Data Reviewed:    Recent Labs: 05/10/2021: ALT 17; BUN 16; Creatinine, Ser 1.15; Hemoglobin 12.8; Platelets 321; Potassium 5.1; Sodium 140; TSH 2.260   Recent Lipid Panel Lab Results  Component Value Date/Time   CHOL 182 05/10/2021 10:28 AM   TRIG 155 (H) 05/10/2021 10:28 AM   HDL 58 05/10/2021 10:28 AM   CHOLHDL 3.1 05/10/2021 10:28 AM   LDLCALC 97 05/10/2021 10:28 AM    Wt Readings from Last 3 Encounters:  07/17/21 230 lb (104.3 kg)  05/10/21 228 lb 12.8 oz (103.8 kg)  01/27/21 225 lb 12.8 oz (102.4 kg)      Objective:    Vital Signs:  BP 128/62 (BP Location: Right Arm, Patient Position: Sitting)    Pulse 99    Temp (!) 97.3 F (36.3 C)    Ht 5\' 3"  (1.6 m)    Wt 230 lb (104.3 kg)    SpO2 98%    BMI 40.74 kg/m    Physical Exam Vitals reviewed.  Constitutional:      Appearance: Normal appearance. She is well-developed. She is obese.  HENT:     Right Ear: Tympanic membrane, ear canal and external ear normal.     Ears:     Comments: Left tm mild erythema.    Nose: Nose normal.     Mouth/Throat:     Pharynx: Oropharynx is clear.     Comments: Apthous ulcers  Neck:     Vascular: No carotid bruit.  Cardiovascular:     Rate and Rhythm: Normal rate and regular rhythm.     Heart sounds: Normal heart sounds. No murmur heard. Pulmonary:     Effort: Pulmonary effort is normal. No respiratory distress.     Breath sounds: Wheezing (diffuse. poor air exchange.) present.  Musculoskeletal:        General: Tenderness (lumbar) present.  Neurological:     Mental Status: She is alert and oriented to person, place, and time.  Psychiatric:        Mood and Affect: Mood normal.        Behavior: Behavior normal.     ASSESSMENT & PLAN:   1. Sore throat - POCT rapid strep A  2. Acute bronchitis due to other specified organisms -  POCT Influenza A/B - POC COVID-19 BinaxNow - triamcinolone acetonide (KENALOG-40) injection 80 mg - cefTRIAXone (ROCEPHIN) injection 1 g - cefdinir (OMNICEF) 300 MG capsule; Take 1 capsule (300 mg total) by mouth 2 (two) times daily.  Dispense: 20 capsule; Refill: 0 - albuterol (VENTOLIN HFA) 108 (90 Base) MCG/ACT inhaler; Inhale 2 puffs into the lungs every 6 (six) hours as needed for wheezing or shortness of breath.  Dispense: 8 g; Refill: 0 - benzonatate (TESSALON) 200 MG capsule; Take 1 capsule (200 mg total) by mouth 3 (three) times daily as needed for cough.  Dispense: 30 capsule; Refill: 0  3. Other headache syndrome - POCT Influenza A/B   PLAN Given rocephin  and kenalog shots.  Rx: cefdinir Rx: albuterol inhaler 2 puffs four times a day x 48 hours then go to four times a day as needed for wheezing. Rx: benzonatate perles for cough. Rinse mouth with warm salt water for sores in mouth.   Orders Placed This Encounter  Procedures   POCT Influenza A/B   POCT rapid strep A   POC COVID-19 BinaxNow     Meds ordered this encounter  Medications   triamcinolone acetonide (KENALOG-40) injection 80 mg   cefTRIAXone (ROCEPHIN) injection 1 g   cefdinir (OMNICEF) 300 MG capsule    Sig: Take 1 capsule (300 mg total) by mouth 2 (two) times daily.    Dispense:  20 capsule    Refill:  0   albuterol (VENTOLIN HFA) 108 (90 Base) MCG/ACT inhaler    Sig: Inhale 2 puffs into the lungs every 6 (six) hours as needed for wheezing or shortness of breath.    Dispense:  8 g    Refill:  0   benzonatate (TESSALON) 200 MG capsule    Sig: Take 1 capsule (200 mg total) by mouth 3 (three) times daily as needed for cough.    Dispense:  30 capsule    Refill:  0    COVID-19 Education: The signs and symptoms of COVID-19 were discussed with the patient and how to seek care for testing (follow up with PCP or arrange E-visit). The importance of social distancing was discussed today.   Follow Up:  In Person prn  Signed, Rochel Brome, MD  07/19/2021 10:52 PM    [place]

## 2021-07-17 NOTE — Patient Instructions (Signed)
Given rocephin and kenalog shots.  Rx: cefdinir Rx: albuterol inhaler 2 puffs four times a day x 48 hours then go to four times a day as needed for wheezing. Rx: benzonatate perles for cough.  Rinse mouth with warm salt water for sores in mouth.

## 2021-08-05 ENCOUNTER — Other Ambulatory Visit: Payer: Self-pay | Admitting: Physician Assistant

## 2021-08-06 ENCOUNTER — Other Ambulatory Visit: Payer: Self-pay | Admitting: Family Medicine

## 2021-08-06 DIAGNOSIS — J208 Acute bronchitis due to other specified organisms: Secondary | ICD-10-CM

## 2021-08-16 ENCOUNTER — Encounter: Payer: PPO | Admitting: Physician Assistant

## 2021-08-28 ENCOUNTER — Other Ambulatory Visit: Payer: Self-pay | Admitting: Physician Assistant

## 2021-08-28 DIAGNOSIS — E1142 Type 2 diabetes mellitus with diabetic polyneuropathy: Secondary | ICD-10-CM

## 2021-08-29 ENCOUNTER — Encounter: Payer: Self-pay | Admitting: Physician Assistant

## 2021-08-29 ENCOUNTER — Ambulatory Visit (INDEPENDENT_AMBULATORY_CARE_PROVIDER_SITE_OTHER): Payer: PPO | Admitting: Physician Assistant

## 2021-08-29 VITALS — BP 132/72 | HR 96 | Resp 18 | Ht 63.0 in | Wt 231.2 lb

## 2021-08-29 DIAGNOSIS — E559 Vitamin D deficiency, unspecified: Secondary | ICD-10-CM | POA: Diagnosis not present

## 2021-08-29 DIAGNOSIS — M353 Polymyalgia rheumatica: Secondary | ICD-10-CM

## 2021-08-29 DIAGNOSIS — K148 Other diseases of tongue: Secondary | ICD-10-CM

## 2021-08-29 DIAGNOSIS — J208 Acute bronchitis due to other specified organisms: Secondary | ICD-10-CM | POA: Diagnosis not present

## 2021-08-29 DIAGNOSIS — F419 Anxiety disorder, unspecified: Secondary | ICD-10-CM

## 2021-08-29 DIAGNOSIS — R051 Acute cough: Secondary | ICD-10-CM

## 2021-08-29 DIAGNOSIS — E1142 Type 2 diabetes mellitus with diabetic polyneuropathy: Secondary | ICD-10-CM | POA: Diagnosis not present

## 2021-08-29 DIAGNOSIS — E782 Mixed hyperlipidemia: Secondary | ICD-10-CM | POA: Diagnosis not present

## 2021-08-29 LAB — POC COVID19 BINAXNOW: SARS Coronavirus 2 Ag: NEGATIVE

## 2021-08-29 MED ORDER — PREDNISONE 20 MG PO TABS: ORAL_TABLET | ORAL | 0 refills | Status: AC

## 2021-08-29 MED ORDER — LEVOFLOXACIN 500 MG PO TABS: 500.0000 mg | ORAL_TABLET | Freq: Every day | ORAL | 0 refills | Status: AC

## 2021-08-29 MED ORDER — IPRATROPIUM-ALBUTEROL 0.5-2.5 (3) MG/3ML IN SOLN: 3.0000 mL | Freq: Once | RESPIRATORY_TRACT | Status: DC

## 2021-08-29 MED ORDER — HYDROCODONE BIT-HOMATROP MBR 5-1.5 MG/5ML PO SOLN: 5.0000 mL | Freq: Four times a day (QID) | ORAL | 0 refills | Status: DC | PRN

## 2021-08-29 NOTE — Progress Notes (Signed)
Subjective:  Patient ID: Madison Stein, female    DOB: 04-Feb-1951  Age: 71 y.o. MRN: [id number]  Chief Complaint  Patient presents with   Diabetes   Cough    Had pneumonia in Jan-cleared for 2-3 days cough, congestion, wheezing returned    HPI  Madison Stein has a history of type 2 Diabetes Mellitus for about 15 years. Current treatment includesamaryl and janumet  She denies hypoglycemic episodes. She states blood glucose levels at home range from 170 to 180. She  is trying to watch her diet but has been poor at that recently  Pt with history of Vit D deficiency - due for labwork - is taking supplements  Mixed hyperlipidemia  Pt presents with hyperlipidemia.  Compliance with treatment has been fair The patient is compliant with medications, maintains a low cholesterol diet , follows up as directed , and maintains an exercise regimen . The patient denies experiencing any hypercholesterolemia related symptoms. She is taking vascepa  Pt with history of depression with anxiety - states symptoms are stable on wellbutrin and duloxetine.  Voices no breakthrough symptoms  Pt has a lesion on the tip of her tongue that has been there about 2 months - states it is bothering her with eating  - she is not a current smoker but was about 30 years ago  Pt was diagnosed about a month ago with pneumonia  and states that she seemed to get some better but the symptoms recurred in the past week - she complains of cough, congestion and wheezing - she does have albuterol inhaler at home which is helpful   Lab Results  Component Value Date   HGBA1C 8.6 (H) 05/10/2021   HGBA1C 6.7 (H) 01/27/2021   HGBA1C 6.7 (H) 10/11/2020    Current Outpatient Medications on File Prior to Visit  Medication Sig Dispense Refill   acetaminophen (TYLENOL) 650 MG CR tablet Take 650-1,300 mg by mouth 2 (two) times daily as needed for pain.     albuterol (VENTOLIN HFA) 108 (90 Base) MCG/ACT inhaler INHALE 2 PUFFS INTO THE  LUNGS EVERY 6 HOURS AS NEEDED FOR WHEEZING OR SHORTNESS OF BREATH 8.5 g 1   aspirin EC 81 MG tablet Take 1 tablet (81 mg total) by mouth daily. 30 tablet 0   buPROPion (WELLBUTRIN XL) 300 MG 24 hr tablet TAKE 1 TABLET(300 MG) BY MOUTH DAILY 90 tablet 1   Continuous Blood Gluc Receiver (FREESTYLE LIBRE 14 DAY READER) DEVI none 1 each 0   Continuous Blood Gluc Sensor (FREESTYLE LIBRE 14 DAY SENSOR) MISC none 1 each 2   DULoxetine (CYMBALTA) 60 MG capsule TAKE 1 CAPSULE BY MOUTH TWICE DAILY 180 capsule 0   FREESTYLE LITE test strip CHECK BLOOD SUGAR EVERY DAY 100 strip 1   gabapentin (NEURONTIN) 300 MG capsule TAKE 1 CAPSULE BY MOUTH EVERY MORNING THEN TAKE 2 CAPSULES BY MOUTH EVERY NIGHT AT BEDTIME 90 capsule 1   glimepiride (AMARYL) 4 MG tablet Take 1 tablet (4 mg total) by mouth daily. 90 tablet 0   JANUMET XR [phone] MG TB24 TAKE 1 TABLET BY MOUTH DAILY WITH SUPPER 30 tablet 2   methocarbamol (ROBAXIN) 500 MG tablet TAKE 1 TABLET(500 MG) BY MOUTH DAILY AS NEEDED FOR MUSCLE SPASMS 30 tablet 3   Multiple Vitamin (MULTIVITAMIN WITH MINERALS) TABS tablet Take 1 tablet by mouth daily.     omeprazole (PRILOSEC) 40 MG capsule TAKE 1 CAPSULE(40 MG) BY MOUTH DAILY 90 capsule 0   rizatriptan (  MAXALT-MLT) 10 MG disintegrating tablet DISSOLVE 1 TABLET ON TONGUE, MAY REPEAT IN 2 HOURS IF NEEDED, MAX OF 3 IN 24 HOURS 18 tablet 2   VASCEPA 1 g capsule TAKE 2 CAPSULES BY MOUTH TWICE DAILY WITH FOOD 120 capsule 3   Vitamin D, Ergocalciferol, (DRISDOL) 1.25 MG (50000 UNIT) CAPS capsule Take 1 capsule (50,000 Units total) by mouth every 7 (seven) days. 5 capsule 5   No current facility-administered medications on file prior to visit.   Past Medical History:  Diagnosis Date   Anxiety    Arthritis    Bronchitis 6073   Hx of   Complication of anesthesia    Depression    takes Cymbalta daily   Depression    Depression with anxiety    Diabetes mellitus without complication ([place])    takes Metformin daily    Diabetes mellitus without complication ([place])    Type II   Family history of adverse reaction to anesthesia    patients mother and sister gets sick   Family history of anesthesia complication    mom and sister get very sick   Fibromyalgia    History of bronchitis 34yrs ago   History of shingles    Hyperlipidemia    takes Fish Oil bid   Hyperlipidemia    Insomnia    has Ambien if needed   Joint pain    Nontoxic (diffuse) goiter    Peripheral neuropathy    takes Lyrica daily   Pneumonia 2yrs ago   hx of   Pneumonia    hx of   Polymyalgia rheumatica (HCC)    PONV (postoperative nausea and vomiting)    Pure hyperglyceridemia    Thyroid nodule    Type 2 diabetes mellitus with diabetic polyneuropathy (HCC)    Urgency of urination    Leakage wears pad   Past Surgical History:  Procedure Laterality Date   BACK SURGERY  1989   lumbar disc   CARPAL TUNNEL RELEASE Right 1985   CATARACT EXTRACTION  2020   CERVICAL FUSION  2009   CHOLECYSTECTOMY  2007   COLONOSCOPY     ESOPHAGOGASTRODUODENOSCOPY     EYE SURGERY Bilateral 2007   laser eye surgery    HAMMER TOE SURGERY  2019   HIP ARTHROPLASTY Bilateral    REFRACTIVE SURGERY  2008   TOTAL HIP ARTHROPLASTY Right 11/20/2013   Procedure: RIGHT TOTAL HIP ARTHROPLASTY;  Surgeon: Kerin Salen, MD;  Location: [facility];  Service: Orthopedics;  Laterality: Right;   TOTAL HIP ARTHROPLASTY Left 08/30/2014   Procedure: TOTAL HIP ARTHROPLASTY;  Surgeon: Kerin Salen, MD;  Location: [facility];  Service: Orthopedics;  Laterality: Left;   TOTAL KNEE ARTHROPLASTY Left 06/06/2015   Procedure: TOTAL KNEE ARTHROPLASTY;  Surgeon: Frederik Pear, MD;  Location: [facility];  Service: Orthopedics;  Laterality: Left;   TOTAL KNEE ARTHROPLASTY Right 09/23/2017   Procedure: TOTAL KNEE ARTHROPLASTY;  Surgeon: Frederik Pear, MD;  Location: [facility];  Service: Orthopedics;  Laterality: Right;   TUBAL LIGATION  1987   TUMOR REMOVAL Right    Hand   tumor removed from right hand   2011   ulnar nerve release Left 1996    Family History  Problem Relation Age of Onset   Alzheimer's disease Mother    Kidney failure Father    Diabetes Father    Asthma Daughter    Healthy Daughter    Social History   Socioeconomic History   Marital status: Divorced  Spouse name: Not on file   Number of children: 1   Years of education: Not on file   Highest education level: Some college, no degree  Occupational History   Not on file  Tobacco Use   Smoking status: Former   Smokeless tobacco: Never   Tobacco comments:    quit smoking in 1997  Vaping Use   Vaping Use: Never used  Substance and Sexual Activity   Alcohol use: Not Currently    Comment: rare beer   Drug use: No   Sexual activity: Never    Birth control/protection: Post-menopausal  Other Topics Concern   Not on file  Social History Narrative   Lives alone   Left handed   Caffeine: at the most 2 cups of coffee/day      ** Merged History Encounter **       Social Determinants of Health   Financial Resource Strain: Not on file  Food Insecurity: Not on file  Transportation Needs: Not on file  Physical Activity: Not on file  Stress: Not on file  Social Connections: Not on file    Review of Systems CONSTITUTIONAL: has had some malaise E/N/T: see HPI CARDIOVASCULAR: Negative for chest pain, dizziness, palpitations and pedal edema.  RESPIRATORY:see HPI GASTROINTESTINAL: Negative for abdominal pain, acid reflux symptoms, constipation, diarrhea, nausea and vomiting.  MSK: Negative for arthralgias and myalgias.  INTEGUMENTARY: Negative for rash.  NEUROLOGICAL: Negative for dizziness and headaches.  PSYCHIATRIC: Negative for sleep disturbance and to question depression screen.  Negative for depression, negative for anhedonia.       Objective:  BP 132/72    Pulse 96    Resp 18    Ht 5\' 3"  (1.6 m)    Wt 231 lb 3.2 oz (104.9 kg)    SpO2 94%    BMI 40.96 kg/m   BP/Weight 08/29/2021 07/17/2021 44/0/1027   Systolic BP 253 664 403  Diastolic BP 72 62 72  Wt. (Lbs) 231.2 230 228.8  BMI 40.96 40.74 40.53    Physical Exam PHYSICAL EXAM:   VS: BP 132/72    Pulse 96    Resp 18    Ht 5\' 3"  (1.6 m)    Wt 231 lb 3.2 oz (104.9 kg)    SpO2 94%    BMI 40.96 kg/m   GEN: Well nourished, well developed, in no acute distress  HEENT: normal external ears and nose - normal external auditory canals and TMS -- tongue shows small lesion/mass on tip Oropharynx - normal mucosa, palate, and posterior pharynx Cardiac: RRR; no murmurs, rubs, or gallops,no edema - Respiratory:  scattered rhonchi and wheezes throughout both lung fields  MS: no deformity or atrophy  Skin: warm and dry, no rash   Psych: euthymic mood, appropriate affect and demeanor  Office Visit on 08/29/2021  Component Date Value Ref Range Status   SARS Coronavirus 2 Ag 08/29/2021 Negative  Negative Final    Lab Results  Component Value Date   WBC 10.6 05/10/2021   HGB 12.8 05/10/2021   HCT 40.1 05/10/2021   PLT 321 05/10/2021   GLUCOSE 199 (H) 05/10/2021   CHOL 182 05/10/2021   TRIG 155 (H) 05/10/2021   HDL 58 05/10/2021   LDLCALC 97 05/10/2021   ALT 17 05/10/2021   AST 17 05/10/2021   NA 140 05/10/2021   K 5.1 05/10/2021   CL 102 05/10/2021   CREATININE 1.15 (H) 05/10/2021   BUN 16 05/10/2021   CO2 25 05/10/2021  TSH 2.260 05/10/2021   INR 0.98 09/12/2017   HGBA1C 8.6 (H) 05/10/2021   MICROALBUR 10 03/30/2020      Assessment & Plan:   Problem List Items Addressed This Visit       Endocrine   Type 2 diabetes mellitus with diabetic polyneuropathy (HCC)   Relevant Orders   CBC with Differential/Platelet   Comprehensive metabolic panel   TSH   Hemoglobin A1c   Microalbumin / creatinine urine ratio     Other   Polymyalgia rheumatica (HCC)   Mixed hyperlipidemia   Relevant Orders   Lipid panel   Anxiety Continue current meds   Vitamin D insufficiency   Relevant Orders   VITAMIN D 25 Hydroxy (Vit-D  Deficiency, Fractures)   Other Visit Diagnoses     Acute cough    -  Primary   Relevant Orders   POC COVID-19 (Completed)   Tongue lesion       Relevant Orders   Ambulatory referral to ENT   Acute bronchitis due to other specified organisms       Relevant Medications   ipratropium-albuterol (DUONEB) 0.5-2.5 (3) MG/3ML nebulizer solution 3 mL   levofloxacin (LEVAQUIN) 500 MG tablet   predniSONE (DELTASONE) 20 MG tablet   HYDROcodone bit-homatropine (HYDROMET) 5-1.5 MG/5ML syrup   Other Relevant Orders   DG Chest 2 View     .  Meds ordered this encounter  Medications   ipratropium-albuterol (DUONEB) 0.5-2.5 (3) MG/3ML nebulizer solution 3 mL   levofloxacin (LEVAQUIN) 500 MG tablet    Sig: Take 1 tablet (500 mg total) by mouth daily for 10 days.    Dispense:  10 tablet    Refill:  0    Order Specific Question:   Supervising Provider    Answer:   Shelton Silvas   predniSONE (DELTASONE) 20 MG tablet    Sig: Take 3 tablets (60 mg total) by mouth daily with breakfast for 3 days, THEN 2 tablets (40 mg total) daily with breakfast for 3 days, THEN 1 tablet (20 mg total) daily with breakfast for 3 days.    Dispense:  18 tablet    Refill:  0    Order Specific Question:   Supervising Provider    Answer:   Shelton Silvas   HYDROcodone bit-homatropine (HYDROMET) 5-1.5 MG/5ML syrup    Sig: Take 5 mLs by mouth every 6 (six) hours as needed for cough.    Dispense:  120 mL    Refill:  0    Order Specific Question:   Supervising Provider    AnswerShelton Silvas    Orders Placed This Encounter  Procedures   DG Chest 2 View   CBC with Differential/Platelet   Comprehensive metabolic panel   TSH   Lipid panel   Hemoglobin A1c   VITAMIN D 25 Hydroxy (Vit-D Deficiency, Fractures)   Microalbumin / creatinine urine ratio   Ambulatory referral to ENT   POC COVID-19     Follow-up: Return in about 3 months (around 11/26/2021) for chronic fasting follow up.  An  After Visit Summary was printed and given to the patient.  Yetta Flock [facility] [phone]

## 2021-08-30 LAB — COMPREHENSIVE METABOLIC PANEL
ALT: 16 IU/L (ref 0–32)
AST: 16 IU/L (ref 0–40)
Albumin/Globulin Ratio: 1.5 (ref 1.2–2.2)
Albumin: 4.1 g/dL (ref 3.8–4.8)
Alkaline Phosphatase: 108 IU/L (ref 44–121)
BUN/Creatinine Ratio: 14 (ref 12–28)
BUN: 13 mg/dL (ref 8–27)
Bilirubin Total: 0.3 mg/dL (ref 0.0–1.2)
CO2: 29 mmol/L (ref 20–29)
Calcium: 9.3 mg/dL (ref 8.7–10.3)
Chloride: 98 mmol/L (ref 96–106)
Creatinine, Ser: 0.94 mg/dL (ref 0.57–1.00)
Globulin, Total: 2.7 g/dL (ref 1.5–4.5)
Glucose: 181 mg/dL — ABNORMAL HIGH (ref 70–99)
Potassium: 5.7 mmol/L — ABNORMAL HIGH (ref 3.5–5.2)
Sodium: 139 mmol/L (ref 134–144)
Total Protein: 6.8 g/dL (ref 6.0–8.5)
eGFR: 66 mL/min/{1.73_m2} (ref >59)

## 2021-08-30 LAB — LIPID PANEL
Chol/HDL Ratio: 2.6 ratio (ref 0.0–4.4)
Cholesterol, Total: 151 mg/dL (ref 100–199)
HDL: 58 mg/dL (ref >39)
LDL Chol Calc (NIH): 71 mg/dL (ref 0–99)
Triglycerides: 128 mg/dL (ref 0–149)
VLDL Cholesterol Cal: 22 mg/dL (ref 5–40)

## 2021-08-30 LAB — CBC WITH DIFFERENTIAL/PLATELET
Basophils Absolute: 0.1 10*3/uL (ref 0.0–0.2)
Basos: 1 %
EOS (ABSOLUTE): 0.4 10*3/uL (ref 0.0–0.4)
Eos: 4 %
Hematocrit: 36.6 % (ref 34.0–46.6)
Hemoglobin: 11.1 g/dL (ref 11.1–15.9)
Immature Grans (Abs): 0.2 10*3/uL — ABNORMAL HIGH (ref 0.0–0.1)
Immature Granulocytes: 1 %
Lymphocytes Absolute: 2.7 10*3/uL (ref 0.7–3.1)
Lymphs: 25 %
MCH: 23.4 pg — ABNORMAL LOW (ref 26.6–33.0)
MCHC: 30.3 g/dL — ABNORMAL LOW (ref 31.5–35.7)
MCV: 77 fL — ABNORMAL LOW (ref 79–97)
Monocytes Absolute: 0.6 10*3/uL (ref 0.1–0.9)
Monocytes: 6 %
Neutrophils Absolute: 7 10*3/uL (ref 1.4–7.0)
Neutrophils: 63 %
Platelets: 357 10*3/uL (ref 150–450)
RBC: 4.75 x10E6/uL (ref 3.77–5.28)
RDW: 14.8 % (ref 11.7–15.4)
WBC: 10.9 10*3/uL — ABNORMAL HIGH (ref 3.4–10.8)

## 2021-08-30 LAB — MICROALBUMIN / CREATININE URINE RATIO
Creatinine, Urine: 235.3 mg/dL
Microalb/Creat Ratio: 18 mg/g creat (ref 0–29)
Microalbumin, Urine: 42.2 ug/mL

## 2021-08-30 LAB — CARDIOVASCULAR RISK ASSESSMENT

## 2021-08-30 LAB — HEMOGLOBIN A1C
Est. average glucose Bld gHb Est-mCnc: 186 mg/dL
Hgb A1c MFr Bld: 8.1 % — ABNORMAL HIGH (ref 4.8–5.6)

## 2021-08-30 LAB — TSH: TSH: 1.31 u[IU]/mL (ref 0.450–4.500)

## 2021-08-30 LAB — VITAMIN D 25 HYDROXY (VIT D DEFICIENCY, FRACTURES): Vit D, 25-Hydroxy: 44.8 ng/mL (ref 30.0–100.0)

## 2021-09-01 ENCOUNTER — Other Ambulatory Visit: Payer: Self-pay | Admitting: Physician Assistant

## 2021-09-01 MED ORDER — ROSUVASTATIN CALCIUM 5 MG PO TABS: 5.0000 mg | ORAL_TABLET | Freq: Every day | ORAL | 2 refills | Status: DC

## 2021-09-01 MED ORDER — RAMIPRIL 2.5 MG PO CAPS: 2.5000 mg | ORAL_CAPSULE | Freq: Every day | ORAL | 2 refills | Status: DC

## 2021-09-01 MED ORDER — PIOGLITAZONE HCL 15 MG PO TABS: 15.0000 mg | ORAL_TABLET | Freq: Every day | ORAL | 2 refills | Status: DC

## 2021-09-02 ENCOUNTER — Other Ambulatory Visit: Payer: Self-pay | Admitting: Physician Assistant

## 2021-09-04 ENCOUNTER — Other Ambulatory Visit: Payer: Self-pay

## 2021-09-04 DIAGNOSIS — J208 Acute bronchitis due to other specified organisms: Secondary | ICD-10-CM

## 2021-09-07 ENCOUNTER — Other Ambulatory Visit: Payer: Self-pay | Admitting: Physician Assistant

## 2021-09-07 DIAGNOSIS — F419 Anxiety disorder, unspecified: Secondary | ICD-10-CM

## 2021-09-21 ENCOUNTER — Other Ambulatory Visit: Payer: Self-pay | Admitting: Physician Assistant

## 2021-09-27 ENCOUNTER — Ambulatory Visit: Payer: PPO

## 2021-09-27 LAB — HM DIABETES EYE EXAM

## 2021-09-29 ENCOUNTER — Encounter: Payer: Self-pay | Admitting: Physician Assistant

## 2021-10-04 ENCOUNTER — Ambulatory Visit: Admission: RE | Admit: 2021-10-04 | Payer: PPO | Source: Ambulatory Visit

## 2021-10-04 ENCOUNTER — Ambulatory Visit (INDEPENDENT_AMBULATORY_CARE_PROVIDER_SITE_OTHER): Payer: PPO | Admitting: Physician Assistant

## 2021-10-04 ENCOUNTER — Encounter: Payer: Self-pay | Admitting: Physician Assistant

## 2021-10-04 ENCOUNTER — Other Ambulatory Visit: Payer: Self-pay

## 2021-10-04 VITALS — BP 124/82 | HR 90 | Temp 97.8°F | Ht 63.0 in | Wt 227.0 lb

## 2021-10-04 DIAGNOSIS — J060 Acute laryngopharyngitis: Secondary | ICD-10-CM

## 2021-10-04 DIAGNOSIS — E782 Mixed hyperlipidemia: Secondary | ICD-10-CM

## 2021-10-04 MED ORDER — PREDNISONE 20 MG PO TABS: ORAL_TABLET | ORAL | 0 refills | Status: AC

## 2021-10-04 MED ORDER — HYDROCODONE BIT-HOMATROP MBR 5-1.5 MG/5ML PO SOLN: 5.0000 mL | Freq: Four times a day (QID) | ORAL | 0 refills | Status: DC | PRN

## 2021-10-04 MED ORDER — TRIAMCINOLONE ACETONIDE 40 MG/ML IJ SUSP
60.0000 mg | Freq: Once | INTRAMUSCULAR | Status: AC
  Administered 2021-10-04: 60 mg via INTRAMUSCULAR

## 2021-10-04 MED ORDER — ICOSAPENT ETHYL 1 G PO CAPS: ORAL_CAPSULE | ORAL | 3 refills | Status: DC

## 2021-10-04 MED ORDER — AZITHROMYCIN 250 MG PO TABS: ORAL_TABLET | ORAL | 0 refills | Status: AC

## 2021-10-04 NOTE — Progress Notes (Signed)
? ?Acute Office Visit ? ?Subjective:  ? ? Patient ID: Madison Stein, female    DOB: 10-Nov-1950, 71 y.o.   MRN: [id number] ? ?Chief Complaint  ?Patient presents with  ? Cough  ? Sore Throat  ? Nasal Congestion  ? ? ?HPI: ?Patient is in today for complaints of cough , congestion and sore throat for the past few days - started with allergy type symptoms and worsened - now cough is productive ?Denies fever or malaise ?Is starting to have some wheezing and concerned since she had pneumonia in December and doesn't want it to progress to that ? ?Past Medical History:  ?Diagnosis Date  ? Anxiety   ? Arthritis   ? Bronchitis 2013  ? Hx of  ? Complication of anesthesia   ? Depression   ? takes Cymbalta daily  ? Depression   ? Depression with anxiety   ? Diabetes mellitus without complication ([place])   ? takes Metformin daily  ? Diabetes mellitus without complication ([place])   ? Type II  ? Family history of adverse reaction to anesthesia   ? patients mother and sister gets sick  ? Family history of anesthesia complication   ? mom and sister get very sick  ? Fibromyalgia   ? History of bronchitis 63yr ago  ? History of shingles   ? Hyperlipidemia   ? takes Fish Oil bid  ? Hyperlipidemia   ? Insomnia   ? has Ambien if needed  ? Joint pain   ? Nontoxic (diffuse) goiter   ? Peripheral neuropathy   ? takes Lyrica daily  ? Pneumonia 418yrago  ? hx of  ? Pneumonia   ? hx of  ? Polymyalgia rheumatica ([facility]  ? PONV (postoperative nausea and vomiting)   ? Pure hyperglyceridemia   ? Thyroid nodule   ? Type 2 diabetes mellitus with diabetic polyneuropathy (HCC)   ? Urgency of urination   ? Leakage wears pad  ? ? ?Past Surgical History:  ?Procedure Laterality Date  ? [place] lumbar disc  ? CARPAL TUNNEL RELEASE Right 1985  ? CATARACT EXTRACTION  2020  ? CERVICAL FUSION  2009  ? CHOLECYSTECTOMY  2007  ? COLONOSCOPY    ? ESOPHAGOGASTRODUODENOSCOPY    ? EYE SURGERY Bilateral 2007  ? laser eye surgery   ? HAMMER TOE SURGERY  2019  ?  HIP ARTHROPLASTY Bilateral   ? REFRACTIVE SURGERY  2008  ? TOTAL HIP ARTHROPLASTY Right 11/20/2013  ? Procedure: RIGHT TOTAL HIP ARTHROPLASTY;  Surgeon: FrKerin SalenMD;  [facility];  Laterality: Right;  ? TOTAL HIP ARTHROPLASTY Left 08/30/2014  ? Procedure: TOTAL HIP ARTHROPLASTY;  Surgeon: FrKerin SalenMD;  [place];  Laterality: Left;  ? TOTAL KNEE ARTHROPLASTY Left 06/06/2015  ? Procedure: TOTAL KNEE ARTHROPLASTY;  Surgeon: FrFrederik PearMD;  [facility];  Laterality: Left;  ? TOTAL KNEE ARTHROPLASTY Right 09/23/2017  ? Procedure: TOTAL KNEE ARTHROPLASTY;  Surgeon: RoFrederik PearMD;  [facility];  Laterality: Right;  ? TUBAL LIGATION  1987  ? TUMOR REMOVAL Right   ? Hand  ? tumor removed from right hand  2011  ? ulnar nerve release Left 1996  ? ? ?Family History  ?Problem Relation Age of Onset  ? Alzheimer's disease Mother   ? Kidney failure Father   ? Diabetes Father   ? Asthma  Daughter   ? Healthy Daughter   ? ? ?Social History  ? ?Socioeconomic History  ? Marital status: Divorced  ?  Spouse name: Not on file  ? Number of children: 1  ? Years of education: Not on file  ? Highest education level: Some college, no degree  ?Occupational History  ? Not on file  ?Tobacco Use  ? Smoking status: Former  ? Smokeless tobacco: Never  ? Tobacco comments:  ?  quit smoking in 1997  ?Vaping Use  ? Vaping Use: Never used  ?Substance and Sexual Activity  ? Alcohol use: Not Currently  ?  Comment: rare beer  ? Drug use: No  ? Sexual activity: Never  ?  Birth control/protection: Post-menopausal  ?Other Topics Concern  ? Not on file  ?Social History Narrative  ? Lives alone  ? Left handed  ? Caffeine: at the most 2 cups of coffee/day  ?   ? ** Merged History Encounter **  ?    ? ?Social Determinants of Health  ? ?Financial Resource Strain: Not on file  ?Food Insecurity: Not on file  ?Transportation Needs: Not on file   ?Physical Activity: Not on file  ?Stress: Not on file  ?Social Connections: Not on file  ?Intimate Partner Violence: Not on file  ? ? ?Outpatient Medications Prior to Visit  ?Medication Sig Dispense Refill  ? acetaminophen (TYLENOL) 650 MG CR tablet Take 650-1,300 mg by mouth 2 (two) times daily as needed for pain.    ? albuterol (VENTOLIN HFA) 108 (90 Base) MCG/ACT inhaler INHALE 2 PUFFS INTO THE LUNGS EVERY 6 HOURS AS NEEDED FOR WHEEZING OR SHORTNESS OF BREATH 8.5 g 1  ? aspirin EC 81 MG tablet Take 1 tablet (81 mg total) by mouth daily. 30 tablet 0  ? buPROPion (WELLBUTRIN XL) 300 MG 24 hr tablet TAKE 1 TABLET(300 MG) BY MOUTH DAILY 90 tablet 1  ? Continuous Blood Gluc Receiver (FREESTYLE LIBRE 14 DAY READER) DEVI none 1 each 0  ? Continuous Blood Gluc Sensor (FREESTYLE LIBRE 14 DAY SENSOR) MISC none 1 each 2  ? DULoxetine (CYMBALTA) 60 MG capsule TAKE ONE CAPSULE BY MOUTH TWICE DAILY 180 capsule 0  ? FREESTYLE LITE test strip CHECK BLOOD SUGAR EVERY DAY 100 strip 1  ? gabapentin (NEURONTIN) 300 MG capsule TAKE 1 CAPSULE BY MOUTH EVERY MORNING THEN TAKE 2 CAPSULES BY MOUTH EVERY NIGHT AT BEDTIME 90 capsule 1  ? glimepiride (AMARYL) 4 MG tablet Take 1 tablet (4 mg total) by mouth daily. 90 tablet 0  ? JANUMET XR [id number] MG TB24 TAKE 1 TABLET BY MOUTH DAILY WITH SUPPER 30 tablet 2  ? methocarbamol (ROBAXIN) 500 MG tablet TAKE 1 TABLET(500 MG) BY MOUTH DAILY AS NEEDED FOR MUSCLE SPASMS 30 tablet 3  ? Multiple Vitamin (MULTIVITAMIN WITH MINERALS) TABS tablet Take 1 tablet by mouth daily.    ? omeprazole (PRILOSEC) 40 MG capsule TAKE 1 CAPSULE(40 MG) BY MOUTH DAILY 90 capsule 0  ? pioglitazone (ACTOS) 15 MG tablet Take 1 tablet (15 mg total) by mouth daily. 30 tablet 2  ? ramipril (ALTACE) 2.5 MG capsule Take 1 capsule (2.5 mg total) by mouth daily. 30 capsule 2  ? rizatriptan (MAXALT-MLT) 10 MG disintegrating tablet DISSOLVE 1 TABLET ON TONGUE, MAY REPEAT IN 2 HOURS IF NEEDED, MAX OF 3 IN 24 HOURS 18 tablet 2  ?  rosuvastatin (CRESTOR) 5 MG tablet Take 1 tablet (5 mg total) by mouth daily. 30 tablet 2  ? VASCEPA  1 g capsule TAKE 2 CAPSULES BY MOUTH TWICE DAILY WITH FOOD 120 capsule 3  ? Vitamin D, Ergocalciferol, (DRISDOL) 1.25 MG (50000 UNIT) CAPS capsule Take 1 capsule (50,000 Units total) by mouth every 7 (seven) days. 5 capsule 5  ? HYDROcodone bit-homatropine (HYDROMET) 5-1.5 MG/5ML syrup Take 5 mLs by mouth every 6 (six) hours as needed for cough. 120 mL 0  ? ?Facility-Administered Medications Prior to Visit  ?Medication Dose Route Frequency Provider Last Rate Last Admin  ? ipratropium-albuterol (DUONEB) 0.5-2.5 (3) MG/3ML nebulizer solution 3 mL  3 mL Nebulization Once Marge Duncans, PA-C      ? ? ?Allergies  ?Allergen Reactions  ? Morphine And Related Nausea And Vomiting  ?  "just don't tolerate it well"  ? Zestril [Lisinopril] Cough  ? ? ?Review of Systems ?CONSTITUTIONAL:see HPI ?E/N/T: see HPI ?CARDIOVASCULAR: Negative for chest pain, dizziness, palpitations and pedal edema.  ?RESPIRATORY: see HPI ?GASTROINTESTINAL: Negative for abdominal pain, acid reflux symptoms, constipation, diarrhea, nausea and vomiting.  ?MSK: Negative for arthralgias and myalgias.  ?INTEGUMENTARY: Negative for rash.  ? ?   ? ?   ?Objective:  ?  ?Physical Exam ?PHYSICAL EXAM:  ? ?VS: BP 124/82   Pulse 90   Temp 97.8 ?F (36.6 ?C)   Ht '5\' 3"'$  (1.6 m)   Wt 227 lb (103 kg)   SpO2 96%   BMI 40.21 kg/m?  ? ?GEN: Well nourished, well developed, in no acute distress  ?HEENT: normal external ears and nose - normal external auditory canals and TMS -  - Lips, Teeth and Gums - normal  ?Oropharynx - erythema noted ?Cardiac: RRR; no murmurs, ?Respiratory:  faint rhonchi and wheezes  ? ?Skin: warm and dry, no rash  ? ? ? ?BP 124/82   Pulse 90   Temp 97.8 ?F (36.6 ?C)   Ht '5\' 3"'$  (1.6 m)   Wt 227 lb (103 kg)   SpO2 96%   BMI 40.21 kg/m?  ?Wt Readings from Last 3 Encounters:  ?10/04/21 227 lb (103 kg)  ?08/29/21 231 lb 3.2 oz (104.9 kg)  ?07/17/21  230 lb (104.3 kg)  ? ? ?Health Maintenance Due  ?Topic Date Due  ? MAMMOGRAM  04/13/2020  ? OPHTHALMOLOGY EXAM  11/06/2020  ? ? ?There are no preventive care reminders to display for this patient. ? ? ?Lab Results  ?C

## 2021-10-04 NOTE — Telephone Encounter (Signed)
Refill sent to pharmacy per Gay Filler. ?

## 2021-10-05 ENCOUNTER — Encounter: Payer: Self-pay | Admitting: Physician Assistant

## 2021-10-10 ENCOUNTER — Other Ambulatory Visit: Payer: Self-pay | Admitting: Physician Assistant

## 2021-10-11 ENCOUNTER — Other Ambulatory Visit: Payer: Self-pay

## 2021-10-11 DIAGNOSIS — Z1231 Encounter for screening mammogram for malignant neoplasm of breast: Secondary | ICD-10-CM

## 2021-10-23 ENCOUNTER — Other Ambulatory Visit: Payer: Self-pay | Admitting: Physician Assistant

## 2021-10-23 DIAGNOSIS — F419 Anxiety disorder, unspecified: Secondary | ICD-10-CM

## 2021-11-09 ENCOUNTER — Other Ambulatory Visit: Payer: Self-pay | Admitting: Physician Assistant

## 2021-11-09 DIAGNOSIS — F419 Anxiety disorder, unspecified: Secondary | ICD-10-CM

## 2021-11-21 ENCOUNTER — Telehealth: Payer: Self-pay

## 2021-11-21 NOTE — Telephone Encounter (Signed)
LVM for pt to call back as soon as possible.  ? ?RE: schedule AWV, none previously billed.  ?

## 2021-11-22 ENCOUNTER — Other Ambulatory Visit: Payer: Self-pay | Admitting: Physician Assistant

## 2021-11-22 DIAGNOSIS — E1142 Type 2 diabetes mellitus with diabetic polyneuropathy: Secondary | ICD-10-CM

## 2021-11-27 ENCOUNTER — Ambulatory Visit: Payer: PPO | Admitting: Physician Assistant

## 2021-12-08 ENCOUNTER — Ambulatory Visit (INDEPENDENT_AMBULATORY_CARE_PROVIDER_SITE_OTHER): Payer: PPO | Admitting: Physician Assistant

## 2021-12-08 VITALS — BP 136/68 | HR 94 | Temp 97.7°F | Resp 18 | Ht 63.0 in | Wt 225.0 lb

## 2021-12-08 DIAGNOSIS — E1142 Type 2 diabetes mellitus with diabetic polyneuropathy: Secondary | ICD-10-CM | POA: Diagnosis not present

## 2021-12-08 DIAGNOSIS — E559 Vitamin D deficiency, unspecified: Secondary | ICD-10-CM

## 2021-12-08 DIAGNOSIS — F419 Anxiety disorder, unspecified: Secondary | ICD-10-CM

## 2021-12-08 DIAGNOSIS — E782 Mixed hyperlipidemia: Secondary | ICD-10-CM | POA: Diagnosis not present

## 2021-12-08 DIAGNOSIS — K2100 Gastro-esophageal reflux disease with esophagitis, without bleeding: Secondary | ICD-10-CM

## 2021-12-08 DIAGNOSIS — M353 Polymyalgia rheumatica: Secondary | ICD-10-CM

## 2021-12-09 LAB — COMPREHENSIVE METABOLIC PANEL
ALT: 18 IU/L (ref 0–32)
AST: 16 IU/L (ref 0–40)
Albumin/Globulin Ratio: 1.9 (ref 1.2–2.2)
Albumin: 4.3 g/dL (ref 3.8–4.8)
Alkaline Phosphatase: 82 IU/L (ref 44–121)
BUN/Creatinine Ratio: 15 (ref 12–28)
BUN: 14 mg/dL (ref 8–27)
Bilirubin Total: 0.3 mg/dL (ref 0.0–1.2)
CO2: 24 mmol/L (ref 20–29)
Calcium: 8.9 mg/dL (ref 8.7–10.3)
Chloride: 102 mmol/L (ref 96–106)
Creatinine, Ser: 0.95 mg/dL (ref 0.57–1.00)
Globulin, Total: 2.3 g/dL (ref 1.5–4.5)
Glucose: 181 mg/dL — ABNORMAL HIGH (ref 70–99)
Potassium: 5.2 mmol/L (ref 3.5–5.2)
Sodium: 139 mmol/L (ref 134–144)
Total Protein: 6.6 g/dL (ref 6.0–8.5)
eGFR: 65 mL/min/{1.73_m2} (ref >59)

## 2021-12-09 LAB — TSH: TSH: 0.85 u[IU]/mL (ref 0.450–4.500)

## 2021-12-09 LAB — VITAMIN D 25 HYDROXY (VIT D DEFICIENCY, FRACTURES): Vit D, 25-Hydroxy: 59.2 ng/mL (ref 30.0–100.0)

## 2021-12-09 LAB — LIPID PANEL
Chol/HDL Ratio: 2.8 ratio (ref 0.0–4.4)
Cholesterol, Total: 153 mg/dL (ref 100–199)
HDL: 55 mg/dL (ref >39)
LDL Chol Calc (NIH): 75 mg/dL (ref 0–99)
Triglycerides: 131 mg/dL (ref 0–149)
VLDL Cholesterol Cal: 23 mg/dL (ref 5–40)

## 2021-12-09 LAB — HEMOGLOBIN A1C
Est. average glucose Bld gHb Est-mCnc: 163 mg/dL
Hgb A1c MFr Bld: 7.3 % — ABNORMAL HIGH (ref 4.8–5.6)

## 2021-12-09 LAB — CBC WITH DIFFERENTIAL/PLATELET
Basophils Absolute: 0.1 10*3/uL (ref 0.0–0.2)
Basos: 1 %
EOS (ABSOLUTE): 0.3 10*3/uL (ref 0.0–0.4)
Eos: 3 %
Hematocrit: 32.1 % — ABNORMAL LOW (ref 34.0–46.6)
Hemoglobin: 9.5 g/dL — ABNORMAL LOW (ref 11.1–15.9)
Immature Grans (Abs): 0.1 10*3/uL (ref 0.0–0.1)
Immature Granulocytes: 1 %
Lymphocytes Absolute: 2.3 10*3/uL (ref 0.7–3.1)
Lymphs: 22 %
MCH: 21.2 pg — ABNORMAL LOW (ref 26.6–33.0)
MCHC: 29.6 g/dL — ABNORMAL LOW (ref 31.5–35.7)
MCV: 72 fL — ABNORMAL LOW (ref 79–97)
Monocytes Absolute: 0.7 10*3/uL (ref 0.1–0.9)
Monocytes: 7 %
Neutrophils Absolute: 6.6 10*3/uL (ref 1.4–7.0)
Neutrophils: 66 %
Platelets: 397 10*3/uL (ref 150–450)
RBC: 4.49 x10E6/uL (ref 3.77–5.28)
RDW: 16.3 % — ABNORMAL HIGH (ref 11.7–15.4)
WBC: 10.1 10*3/uL (ref 3.4–10.8)

## 2021-12-09 LAB — CARDIOVASCULAR RISK ASSESSMENT

## 2021-12-10 ENCOUNTER — Encounter: Payer: Self-pay | Admitting: Physician Assistant

## 2021-12-10 ENCOUNTER — Other Ambulatory Visit: Payer: Self-pay | Admitting: Physician Assistant

## 2021-12-10 NOTE — Progress Notes (Signed)
Established Patient Office Visit  Subjective:  Patient ID: Madison Stein, female    DOB: 1951-05-23  Age: 71 y.o. MRN: [id number]  CC:  Chief Complaint  Patient presents with   Diabetes   Wheezing    HPI Madison Stein presents for follow up diabetes   Patient presents with type 2 diabetes mellitus with diabetic polyneuropathy.  Specifically, this is type 2, non-insulin requiring diabetes without complications.  Date of diagnosis 08/2011.  Compliance with treatment has been fair; she skips some medication doses.  Primary symptoms reported include nerve dysfunction (manifested as burning ).  She specifically denies blurred vision, fatigue, headache, hypoglycemia, leg cramps, nocturia, excessive thirst or frequent urination.  currently on amaryl $RemoveB'4mg'ACJNzhGu$  and janumet 100/1000 She also is on gabapentin which helps with neuropathy She was advised to begin actos, ramipril and crestor with last labwork but she has not started any of those medications    Pt presents with hyperlipidemia.  Date of diagnosis 06/2012.  Current treatment includes Vascepa.  Compliance with treatment has been good; maintains her low cholesterol diet.  She denies experiencing any hypercholesterolemia related symptoms.  Concurrent health problems include diabetes.  She has refused statin treatment    Depression with anxiety details; her anxiety disorder was originally diagnosed many years ago.  Her symptom complex includes apprehension. Current treatment includes Cymbalta $RemoveBeforeDEI'60mg'sscocbAkJynXZYpG$  and  also wellbutrin XL $RemoveBefor'300mg'ioUCtwNPuPFv$  qd She denies pertinent past medical history.  Family history is pertinent for anxiety and depression.      Polymyalgia rheumatica details; this was diagnosed years ago.  Compliance with treatment has been good; Madison Stein takes her medication as directed, maintains her diet and exercise regimen, and follows up as directed.  Primary complaints include joint stiffness. She denies associated swelling, redness, warmth, deformity or  myalgias.  The pattern of joint symptomatology has been stable and nonprogressive.  Primary joints affected include the cervical and lumbar spine and knees.  Support mechanisms include family, friends, and husband.  Pertinent medical history is remarkable for polymyalgia rheumatica.  Family history is remarkable for osteoarthritis and rheumatoid arthritis.  Currently on Neurontin 300 mg 1 QAM and 2 QPM, Cymbalta 60 mg BID - she also uses robaxin  Pt with history of Vit D def - currently on supplement and due for labwork  Pt with history of GERD - stable on prilosec $RemoveBef'40mg'fnjFxkhzWr$  qd   Pt with history of headaches - uses maxalt as needed    Past Medical History:  Diagnosis Date   Anxiety    Arthritis    Bronchitis 2633   Hx of   Complication of anesthesia    Depression    takes Cymbalta daily   Depression    Depression with anxiety    Diabetes mellitus without complication (HCC)    takes Metformin daily   Diabetes mellitus without complication (HCC)    Type II   Family history of adverse reaction to anesthesia    patients mother and sister gets sick   Family history of anesthesia complication    mom and sister get very sick   Fibromyalgia    History of bronchitis 51yrs ago   History of shingles    Hyperlipidemia    takes Fish Oil bid   Hyperlipidemia    Insomnia    has Ambien if needed   Joint pain    Nontoxic (diffuse) goiter    Peripheral neuropathy    takes Lyrica daily   Pneumonia 47yrs ago   hx of  Pneumonia    hx of   Polymyalgia rheumatica (HCC)    PONV (postoperative nausea and vomiting)    Pure hyperglyceridemia    Thyroid nodule    Type 2 diabetes mellitus with diabetic polyneuropathy (HCC)    Urgency of urination    Leakage wears pad    Past Surgical History:  Procedure Laterality Date   BACK SURGERY  1989   lumbar disc   CARPAL TUNNEL RELEASE Right 1985   CATARACT EXTRACTION  2020   CERVICAL FUSION  2009   CHOLECYSTECTOMY  2007   COLONOSCOPY      ESOPHAGOGASTRODUODENOSCOPY     EYE SURGERY Bilateral 2007   laser eye surgery    HAMMER TOE SURGERY  2019   HIP ARTHROPLASTY Bilateral    REFRACTIVE SURGERY  2008   TOTAL HIP ARTHROPLASTY Right 11/20/2013   Procedure: RIGHT TOTAL HIP ARTHROPLASTY;  Surgeon: Kerin Salen, MD;  Location: [place];  Service: Orthopedics;  Laterality: Right;   TOTAL HIP ARTHROPLASTY Left 08/30/2014   Procedure: TOTAL HIP ARTHROPLASTY;  Surgeon: Kerin Salen, MD;  Location: [place];  Service: Orthopedics;  Laterality: Left;   TOTAL KNEE ARTHROPLASTY Left 06/06/2015   Procedure: TOTAL KNEE ARTHROPLASTY;  Surgeon: Frederik Pear, MD;  Location: [place];  Service: Orthopedics;  Laterality: Left;   TOTAL KNEE ARTHROPLASTY Right 09/23/2017   Procedure: TOTAL KNEE ARTHROPLASTY;  Surgeon: Frederik Pear, MD;  Location: [place];  Service: Orthopedics;  Laterality: Right;   TUBAL LIGATION  1987   TUMOR REMOVAL Right    Hand   tumor removed from right hand  2011   ulnar nerve release Left 1996    Family History  Problem Relation Age of Onset   Alzheimer's disease Mother    Kidney failure Father    Diabetes Father    Asthma Daughter    Healthy Daughter     Social History   Socioeconomic History   Marital status: Divorced    Spouse name: Not on file   Number of children: 1   Years of education: Not on file   Highest education level: Some college, no degree  Occupational History   Not on file  Tobacco Use   Smoking status: Former   Smokeless tobacco: Never   Tobacco comments:    quit smoking in 1997  Vaping Use   Vaping Use: Never used  Substance and Sexual Activity   Alcohol use: Not Currently    Comment: rare beer   Drug use: No   Sexual activity: Never    Birth control/protection: Post-menopausal  Other Topics Concern   Not on file  Social History Narrative   Lives alone   Left handed   Caffeine: at the most 2 cups of coffee/day      ** Merged History Encounter **       Social Determinants of Health    Financial Resource Strain: Not on file  Food Insecurity: Not on file  Transportation Needs: Not on file  Physical Activity: Not on file  Stress: Not on file  Social Connections: Not on file  Intimate Partner Violence: Not on file     Current Outpatient Medications:    acetaminophen (TYLENOL) 650 MG CR tablet, Take 650-1,300 mg by mouth 2 (two) times daily as needed for pain., Disp: , Rfl:    albuterol (VENTOLIN HFA) 108 (90 Base) MCG/ACT inhaler, INHALE 2 PUFFS INTO THE LUNGS EVERY 6 HOURS AS NEEDED FOR WHEEZING OR SHORTNESS OF BREATH, Disp: 8.5 g,  Rfl: 1   aspirin EC 81 MG tablet, Take 1 tablet (81 mg total) by mouth daily., Disp: 30 tablet, Rfl: 0   buPROPion (WELLBUTRIN XL) 300 MG 24 hr tablet, TAKE 1 TABLET(300 MG) BY MOUTH DAILY, Disp: 90 tablet, Rfl: 1   Continuous Blood Gluc Receiver (FREESTYLE LIBRE 14 DAY READER) DEVI, none, Disp: 1 each, Rfl: 0   Continuous Blood Gluc Sensor (FREESTYLE LIBRE 14 DAY SENSOR) MISC, none, Disp: 1 each, Rfl: 2   DULoxetine (CYMBALTA) 60 MG capsule, TAKE 1 CAPSULE BY MOUTH TWICE DAILY, Disp: 180 capsule, Rfl: 0   FREESTYLE LITE test strip, CHECK BLOOD SUGAR EVERY DAY, Disp: 100 strip, Rfl: 1   gabapentin (NEURONTIN) 300 MG capsule, TAKE 1 CAPSULE BY MOUTH EVERY MORNING THEN TAKE 2 CAPSULES BY MOUTH EVERY NIGHT AT BEDTIME, Disp: 90 capsule, Rfl: 1   glimepiride (AMARYL) 4 MG tablet, Take 1 tablet (4 mg total) by mouth daily., Disp: 90 tablet, Rfl: 0   icosapent Ethyl (VASCEPA) 1 g capsule, TAKE 2 CAPSULES BY MOUTH TWICE DAILY WITH FOOD, Disp: 120 capsule, Rfl: 3   JANUMET XR [id number] [id number] 9300 MG TB24, TAKE 1 TABLET BY MOUTH DAILY WITH SUPPER, Disp: 90 tablet, Rfl: 0   methocarbamol (ROBAXIN) 500 MG tablet, TAKE 1 TABLET(500 MG) BY MOUTH DAILY AS NEEDED FOR MUSCLE SPASMS, Disp: 30 tablet, Rfl: 3   Multiple Vitamin (MULTIVITAMIN WITH MINERALS) TABS tablet, Take 1 tablet by mouth daily., Disp: , Rfl:    omeprazole (PRILOSEC) 40 MG capsule, TAKE 1 CAPSULE(40 MG)  BY MOUTH DAILY, Disp: 90 capsule, Rfl: 0   rizatriptan (MAXALT-MLT) 10 MG disintegrating tablet, DISSOLVE 1 TABLET ON TONGUE, MAY REPEAT IN 2 HOURS IF NEEDED, MAX OF 3 IN 24 HOURS, Disp: 18 tablet, Rfl: 2   Vitamin D, Ergocalciferol, (DRISDOL) 1.25 MG (50000 UNIT) CAPS capsule, TAKE 1 CAPSULE BY MOUTH EVERY 7 DAYS, Disp: 5 capsule, Rfl: 5  Current Facility-Administered Medications:    ipratropium-albuterol (DUONEB) 0.5-2.5 (3) MG/3ML nebulizer solution 3 mL, 3 mL, Nebulization, Once, Marianne Sofia, PA-C   Allergies  Allergen Reactions   Morphine And Related Nausea And Vomiting    "just don't tolerate it well"   Zestril [Lisinopril] Cough  CONSTITUTIONAL: Negative for chills, fatigue, fever, unintentional weight gain and unintentional weight loss.  E/N/T: Negative for ear pain, nasal congestion and sore throat.  CARDIOVASCULAR: Negative for chest pain, dizziness, palpitations and pedal edema.  RESPIRATORY: Negative for recent cough and dyspnea.  GASTROINTESTINAL: Negative for abdominal pain, acid reflux symptoms, constipation, diarrhea, nausea and vomiting.  MSK:see HPI INTEGUMENTARY: Negative for rash.  NEUROLOGICAL: Negative for dizziness and headaches.  PSYCHIATRIC: Negative for sleep disturbance and to question depression screen.  Negative for depression, negative for anhedonia.        Objective:  PHYSICAL EXAM:   VS: BP 136/68   Pulse 94   Temp 97.7 F (36.5 C)   Resp 18   Ht 5\' 3"  (1.6 m)   Wt 225 lb (102.1 kg)   SpO2 91%   BMI 39.86 kg/m   GEN: Well nourished, well developed, in no acute distress   Cardiac: RRR; no murmurs, rubs, or gallops,no edema - no significant varicosities Respiratory:  normal respiratory rate and pattern with no distress - normal breath sounds with no rales, rhonchi, wheezes or rubs  MS: no deformity or atrophy  Skin: warm and dry, no rash  Neuro:  Alert and Oriented x 3, Strength and sensation are intact - CN II-Xii grossly intact Psych:  euthymic mood, appropriate affect and demeanor    Office Visit on 12/08/2021  Component Date Value Ref Range Status   WBC 12/08/2021 10.1  3.4 - 10.8 x10E3/uL Final   RBC 12/08/2021 4.49  3.77 - 5.28 x10E6/uL Final   Hemoglobin 12/08/2021 9.5 (L)  11.1 - 15.9 g/dL Final   Hematocrit 12/08/2021 32.1 (L)  34.0 - 46.6 % Final   MCV 12/08/2021 72 (L)  79 - 97 fL Final   MCH 12/08/2021 21.2 (L)  26.6 - 33.0 pg Final   MCHC 12/08/2021 29.6 (L)  31.5 - 35.7 g/dL Final   RDW 12/08/2021 16.3 (H)  11.7 - 15.4 % Final   Platelets 12/08/2021 397  150 - 450 x10E3/uL Final   Neutrophils 12/08/2021 66  Not Estab. % Final   Lymphs 12/08/2021 22  Not Estab. % Final   Monocytes 12/08/2021 7  Not Estab. % Final   Eos 12/08/2021 3  Not Estab. % Final   Basos 12/08/2021 1  Not Estab. % Final   Neutrophils Absolute 12/08/2021 6.6  1.4 - 7.0 x10E3/uL Final   Lymphocytes Absolute 12/08/2021 2.3  0.7 - 3.1 x10E3/uL Final   Monocytes Absolute 12/08/2021 0.7  0.1 - 0.9 x10E3/uL Final   EOS (ABSOLUTE) 12/08/2021 0.3  0.0 - 0.4 x10E3/uL Final   Basophils Absolute 12/08/2021 0.1  0.0 - 0.2 x10E3/uL Final   Immature Granulocytes 12/08/2021 1  Not Estab. % Final   Immature Grans (Abs) 12/08/2021 0.1  0.0 - 0.1 x10E3/uL Final   Glucose 12/08/2021 181 (H)  70 - 99 mg/dL Final   BUN 12/08/2021 14  8 - 27 mg/dL Final   Creatinine, Ser 12/08/2021 0.95  0.57 - 1.00 mg/dL Final   eGFR 12/08/2021 65  >59 mL/min/1.73 Final   BUN/Creatinine Ratio 12/08/2021 15  12 - 28 Final   Sodium 12/08/2021 139  134 - 144 mmol/L Final   Potassium 12/08/2021 5.2  3.5 - 5.2 mmol/L Final   Chloride 12/08/2021 102  96 - 106 mmol/L Final   CO2 12/08/2021 24  20 - 29 mmol/L Final   Calcium 12/08/2021 8.9  8.7 - 10.3 mg/dL Final   Total Protein 12/08/2021 6.6  6.0 - 8.5 g/dL Final   Albumin 12/08/2021 4.3  3.8 - 4.8 g/dL Final   Globulin, Total 12/08/2021 2.3  1.5 - 4.5 g/dL Final   Albumin/Globulin Ratio 12/08/2021 1.9  1.2 - 2.2 Final    Bilirubin Total 12/08/2021 0.3  0.0 - 1.2 mg/dL Final   Alkaline Phosphatase 12/08/2021 82  44 - 121 IU/L Final   AST 12/08/2021 16  0 - 40 IU/L Final   ALT 12/08/2021 18  0 - 32 IU/L Final   TSH 12/08/2021 0.850  0.450 - 4.500 uIU/mL Final   Cholesterol, Total 12/08/2021 153  100 - 199 mg/dL Final   Triglycerides 12/08/2021 131  0 - 149 mg/dL Final   HDL 12/08/2021 55  >39 mg/dL Final   VLDL Cholesterol Cal 12/08/2021 23  5 - 40 mg/dL Final   LDL Chol Calc (NIH) 12/08/2021 75  0 - 99 mg/dL Final   Chol/HDL Ratio 12/08/2021 2.8  0.0 - 4.4 ratio Final   Comment:                                   T. Chol/HDL Ratio  Men  Women                               1/2 Avg.Risk  3.4    3.3                                   Avg.Risk  5.0    4.4                                2X Avg.Risk  9.6    7.1                                3X Avg.Risk 23.4   11.0    Hgb A1c MFr Bld 12/08/2021 7.3 (H)  4.8 - 5.6 % Final   Comment:          Prediabetes: 5.7 - 6.4          Diabetes: >6.4          Glycemic control for adults with diabetes: <7.0    Est. average glucose Bld gHb Est-m* 12/08/2021 163  mg/dL Final   Vit D, 03-FOODANP 12/08/2021 59.2  30.0 - 100.0 ng/mL Final   Comment: Vitamin D deficiency has been defined by the Institute of Medicine and an Endocrine Society practice guideline as a level of serum 25-OH vitamin D less than 20 ng/mL (1,2). The Endocrine Society went on to further define vitamin D insufficiency as a level between 21 and 29 ng/mL (2). 1. IOM (Institute of Medicine). 2010. Dietary reference    intakes for calcium and D. [place]: The    [employer]. 2. Holick MF, Binkley Goldonna, Bischoff-Ferrari HA, et al.    Evaluation, treatment, and prevention of vitamin D    deficiency: an Endocrine Society clinical practice    guideline. JCEM. 2011 Jul; 96(7):1911-30.    Interpretation 12/08/2021 Note   Final   Supplemental report  is available.    Health Maintenance Due  Topic Date Due   TETANUS/TDAP  Never done   Zoster Vaccines- Shingrix (1 of 2) Never done    There are no preventive care reminders to display for this patient.  Lab Results  Component Value Date   TSH 0.850 12/08/2021   Lab Results  Component Value Date   WBC 10.1 12/08/2021   HGB 9.5 (L) 12/08/2021   HCT 32.1 (L) 12/08/2021   MCV 72 (L) 12/08/2021   PLT 397 12/08/2021   Lab Results  Component Value Date   NA 139 12/08/2021   K 5.2 12/08/2021   CO2 24 12/08/2021   GLUCOSE 181 (H) 12/08/2021   BUN 14 12/08/2021   CREATININE 0.95 12/08/2021   BILITOT 0.3 12/08/2021   ALKPHOS 82 12/08/2021   AST 16 12/08/2021   ALT 18 12/08/2021   PROT 6.6 12/08/2021   ALBUMIN 4.3 12/08/2021   CALCIUM 8.9 12/08/2021   ANIONGAP 7 09/24/2017   EGFR 65 12/08/2021   Lab Results  Component Value Date   CHOL 153 12/08/2021   Lab Results  Component Value Date   HDL 55 12/08/2021   Lab Results  Component Value Date   LDLCALC 75 12/08/2021   Lab Results  Component Value Date   TRIG 131 12/08/2021  Lab Results  Component Value Date   CHOLHDL 2.8 12/08/2021   Lab Results  Component Value Date   HGBA1C 7.3 (H) 12/08/2021      Assessment & Plan:   Problem List Items Addressed This Visit       Endocrine   Type 2 diabetes mellitus with diabetic polyneuropathy ([place]) - Primary   Relevant Orders   CBC with Differential/Platelet   Comprehensive metabolic panel   TSH   Lipid panel   Hemoglobin A1c Continue meds and watch diet     Other   Mixed hyperlipidemia   Relevant Medications   icosapent Ethyl (VASCEPA) 1 g capsule   Other Relevant Orders   Comprehensive metabolic panel   Lipid panel Low chol diet     Other Visit Diagnoses     Vitamin D insufficiency       Relevant Orders   VITAMIN D 25 Hydroxy (Vit-D Deficiency, Fractures)     Anxiety with depression Continue current meds   GERD  Continue  meds  Polymyalgia Rheumatica Continue current meds                 No orders of the defined types were placed in this encounter.   Follow-up: Return in about 3 months (around 03/10/2022) for chronic fasting follow up.    Madison R Verma Grothaus, PA-C

## 2021-12-18 ENCOUNTER — Telehealth: Payer: Self-pay

## 2021-12-18 NOTE — Telephone Encounter (Signed)
PA submitted and approved via covermymeds for [employer].

## 2021-12-19 ENCOUNTER — Telehealth: Payer: Self-pay

## 2021-12-19 NOTE — Telephone Encounter (Signed)
Patient called questioning about the Hemmocult card and if we had gotten them in yet and patient was told yes we have and told to come by and get a pack of them, patient went on to mention that Friday and today she has woke up feeling dizzy and weak she stated that we had done her lab work last week and her hemoglobin was 9.5 and that was the reasoning for picking up the hemmocult cards to make sure she is not losing blood from any where. Patient didn't know with those symptoms is there anything else she needs to be doing or does she need it rechecked? Please advise.

## 2021-12-19 NOTE — Telephone Encounter (Signed)
Patient informed, patient will come in tomorrow for the blood work and to pick up the hemoccult cards.

## 2021-12-20 ENCOUNTER — Other Ambulatory Visit: Payer: PPO

## 2021-12-20 ENCOUNTER — Other Ambulatory Visit: Payer: Self-pay

## 2021-12-20 DIAGNOSIS — D649 Anemia, unspecified: Secondary | ICD-10-CM

## 2021-12-20 DIAGNOSIS — E782 Mixed hyperlipidemia: Secondary | ICD-10-CM

## 2021-12-20 LAB — CBC WITH DIFF/PLATELET
Basophils Absolute: 0.1 10*3/uL (ref 0.0–0.2)
Basos: 1 %
EOS (ABSOLUTE): 0.3 10*3/uL (ref 0.0–0.4)
Eos: 3 %
Hematocrit: 33.1 % — ABNORMAL LOW (ref 34.0–46.6)
Hemoglobin: 9.5 g/dL — ABNORMAL LOW (ref 11.1–15.9)
Immature Grans (Abs): 0.1 10*3/uL (ref 0.0–0.1)
Immature Granulocytes: 1 %
Lymphocytes Absolute: 1.8 10*3/uL (ref 0.7–3.1)
Lymphs: 18 %
MCH: 20.7 pg — ABNORMAL LOW (ref 26.6–33.0)
MCHC: 28.7 g/dL — ABNORMAL LOW (ref 31.5–35.7)
MCV: 72 fL — ABNORMAL LOW (ref 79–97)
Monocytes Absolute: 0.7 10*3/uL (ref 0.1–0.9)
Monocytes: 7 %
Neutrophils Absolute: 6.9 10*3/uL (ref 1.4–7.0)
Neutrophils: 70 %
Platelets: 389 10*3/uL (ref 150–450)
RBC: 4.58 x10E6/uL (ref 3.77–5.28)
RDW: 15.9 % — ABNORMAL HIGH (ref 11.7–15.4)
WBC: 9.8 10*3/uL (ref 3.4–10.8)

## 2021-12-20 MED ORDER — ICOSAPENT ETHYL 1 G PO CAPS: ORAL_CAPSULE | ORAL | 3 refills | Status: DC

## 2021-12-28 ENCOUNTER — Ambulatory Visit (INDEPENDENT_AMBULATORY_CARE_PROVIDER_SITE_OTHER): Payer: PPO

## 2021-12-28 DIAGNOSIS — D649 Anemia, unspecified: Secondary | ICD-10-CM

## 2021-12-29 LAB — POC HEMOCCULT BLD/STL (HOME/3-CARD/SCREEN)
Card #2 Fecal Occult Blod, POC: POSITIVE
Card #3 Fecal Occult Blood, POC: POSITIVE
Fecal Occult Blood, POC: NEGATIVE

## 2022-01-01 ENCOUNTER — Other Ambulatory Visit: Payer: Self-pay

## 2022-01-01 DIAGNOSIS — D649 Anemia, unspecified: Secondary | ICD-10-CM

## 2022-01-01 DIAGNOSIS — R195 Other fecal abnormalities: Secondary | ICD-10-CM

## 2022-01-06 ENCOUNTER — Other Ambulatory Visit: Payer: Self-pay | Admitting: Physician Assistant

## 2022-01-10 ENCOUNTER — Encounter: Payer: Self-pay | Admitting: Nurse Practitioner

## 2022-01-22 ENCOUNTER — Other Ambulatory Visit: Payer: Self-pay | Admitting: Family Medicine

## 2022-01-22 ENCOUNTER — Other Ambulatory Visit: Payer: Self-pay | Admitting: Physician Assistant

## 2022-01-22 ENCOUNTER — Telehealth: Payer: Self-pay

## 2022-01-22 DIAGNOSIS — J208 Acute bronchitis due to other specified organisms: Secondary | ICD-10-CM

## 2022-01-22 DIAGNOSIS — J060 Acute laryngopharyngitis: Secondary | ICD-10-CM
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
## 2022-01-23 ENCOUNTER — Other Ambulatory Visit: Payer: Self-pay | Admitting: Physician Assistant

## 2022-01-24 ENCOUNTER — Encounter: Payer: Self-pay | Admitting: Physician Assistant

## 2022-01-24 ENCOUNTER — Ambulatory Visit (INDEPENDENT_AMBULATORY_CARE_PROVIDER_SITE_OTHER): Payer: PPO | Admitting: Physician Assistant

## 2022-01-24 VITALS — BP 118/60 | HR 107 | Temp 97.9°F | Ht 63.0 in | Wt 219.4 lb

## 2022-01-24 DIAGNOSIS — R195 Other fecal abnormalities: Secondary | ICD-10-CM | POA: Diagnosis not present

## 2022-01-24 DIAGNOSIS — R42 Dizziness and giddiness: Secondary | ICD-10-CM

## 2022-01-24 DIAGNOSIS — D649 Anemia, unspecified: Secondary | ICD-10-CM

## 2022-01-24 DIAGNOSIS — R000 Tachycardia, unspecified: Secondary | ICD-10-CM
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
## 2022-02-05 ENCOUNTER — Other Ambulatory Visit: Payer: Self-pay | Admitting: Family Medicine

## 2022-02-05 ENCOUNTER — Other Ambulatory Visit: Payer: Self-pay | Admitting: Physician Assistant

## 2022-02-05 DIAGNOSIS — E1142 Type 2 diabetes mellitus with diabetic polyneuropathy: Secondary | ICD-10-CM

## 2022-02-06 ENCOUNTER — Other Ambulatory Visit (INDEPENDENT_AMBULATORY_CARE_PROVIDER_SITE_OTHER): Payer: PPO

## 2022-02-06 ENCOUNTER — Ambulatory Visit: Payer: PPO | Admitting: Nurse Practitioner

## 2022-02-06 ENCOUNTER — Encounter: Payer: Self-pay | Admitting: Nurse Practitioner

## 2022-02-06 VITALS — BP 120/72 | HR 103 | Ht 63.0 in | Wt 220.0 lb

## 2022-02-06 DIAGNOSIS — R195 Other fecal abnormalities: Secondary | ICD-10-CM | POA: Diagnosis not present

## 2022-02-06 DIAGNOSIS — D509 Iron deficiency anemia, unspecified: Secondary | ICD-10-CM

## 2022-02-06 LAB — IBC + FERRITIN
Ferritin: 13.3 ng/mL (ref 10.0–291.0)
Iron: 30 ug/dL — ABNORMAL LOW (ref 42–145)
Saturation Ratios: 5.6 % — ABNORMAL LOW (ref 20.0–50.0)
TIBC: 536.2 ug/dL — ABNORMAL HIGH (ref 250.0–450.0)
Transferrin: 383 mg/dL — ABNORMAL HIGH (ref 212.0–360.0)

## 2022-02-06 LAB — CBC
HCT: 34.2 % — ABNORMAL LOW (ref 36.0–46.0)
Hemoglobin: 10.4 g/dL — ABNORMAL LOW (ref 12.0–15.0)
MCHC: 30.5 g/dL (ref 30.0–36.0)
MCV: 69.1 fl — ABNORMAL LOW (ref 78.0–100.0)
Platelets: 406 10*3/uL — ABNORMAL HIGH (ref 150.0–400.0)
RBC: 4.96 Mil/uL (ref 3.87–5.11)
RDW: 21 % — ABNORMAL HIGH (ref 11.5–15.5)
WBC: 15.1 10*3/uL — ABNORMAL HIGH (ref 4.0–10.5)

## 2022-02-06 MED ORDER — NA SULFATE-K SULFATE-MG SULF 17.5-3.13-1.6 GM/177ML PO SOLN: 1.0000 | Freq: Once | ORAL | 0 refills | Status: AC

## 2022-02-06 NOTE — Progress Notes (Signed)
Chief Complaint: Anemia, Hemoccult positive   Assessment & Plan   71 yo female with new microcytic anemia, FOBT + . Dark stools ( even prior to starting iron a week ago ). Takes NSAIDS. Rule out NSAID induced PUD, AVMs, neoplasm Check iron studies ( just started iron about 1.5 weeks) Update CBC to make sure hgb stable. Okay to continue daily baby asa Hold Ibuprofen for now Continue daily Omeprazole.  Will schedule for EGD and colonoscopy.  The risks and benefits of EGD and colonoscopy with possible polypectomy / biopsies were discussed and the patient agrees to proceed.  Procedures can be done next Monday with Dr. Tarri Glenn.  Hold oral iron in preparation for procedures  # Diabetes with peripheral neuropathy  HPI:    Madison Stein is a pleasant 71 y.o. year old female new to the practice, referred by PCP for evaluation of anemia and Hemoccult positive stool.  Her past medical history is significant for DM 2, obesity, hyperlipidemia  Baseline hemoglobin 13.2 in July 2022 > 11.2 in February 2023 >> 9.5 in June 2023.  MCV 72, high RDW. She has intermittently had some black stools within the last few months ( even before starting oral iron 1.5 weeks ago).  Last black stool was last week, BMs normal since. For years she has taken Ibuprofen every couple of days and also a daily baby asa. She has occasional nausea, especially when standing up. No abdominal pain. 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 Marland Kitchen   She was hospitalized at [facility] 7/19 -20th for dizziness which she says was attributed to the anemia.  She didn't get blood transfusion but was started on oral iron. She thinks hgb was around 9.5 ( same as it was at [facility] office in June). I do not have access to those hospital records. She apparently wasn't seen by GI. Dizziness has improved. In the hospital she was started on iron,  Zinc and Omeprazole. She has no history of acid reflux  Last colonoscopy was > 10 years ago. No  [place] of colon cancer  Previous Labs / Imaging::    Latest Ref Rng & Units 12/20/2021    3:26 PM 12/08/2021   10:40 AM 08/29/2021   12:11 PM  CBC  WBC 3.4 - 10.8 x10E3/uL 9.8  10.1  10.9   Hemoglobin 11.1 - 15.9 g/dL 9.5  9.5  11.1   Hematocrit 34.0 - 46.6 % 33.1  32.1  36.6   Platelets 150 - 450 x10E3/uL 389  397  357     No results found for: "LIPASE"    Latest Ref Rng & Units 12/08/2021   10:40 AM 08/29/2021   12:11 PM 05/10/2021   10:28 AM  CMP  Glucose 70 - 99 mg/dL 181  181  199   BUN 8 - 27 mg/dL '14  13  16   '$ Creatinine 0.57 - 1.00 mg/dL 0.95  0.94  1.15   Sodium 134 - 144 mmol/L 139  139  140   Potassium 3.5 - 5.2 mmol/L 5.2  5.7  5.1   Chloride 96 - 106 mmol/L 102  98  102   CO2 20 - 29 mmol/L '24  29  25   '$ Calcium 8.7 - 10.3 mg/dL 8.9  9.3  9.9   Total Protein 6.0 - 8.5 g/dL 6.6  6.8  6.9   Total Bilirubin 0.0 - 1.2 mg/dL 0.3  0.3  0.2   Alkaline Phos 44 - 121 IU/L  82  108  110   AST 0 - 40 IU/L '16  16  17   '$ ALT 0 - 32 IU/L '18  16  17     '$ Imaging:  MR BRAIN W WO CONTRAST Narrative:   [place] [address], [place], [place] [address] [phone]  NEUROIMAGING REPORT  STUDY DATE: 10/18/2019 PATIENT NAME: Madison Stein DOB: [birth year] MRN: [id number]  ORDERING CLINICIAN: Dr Jaynee Eagles CLINICAL HISTORY:  74 year patient with abnormal MRI scan with white  matter changes on cervical MRI COMPARISON FILMS:  none EXAM: MRI Brain w/wo TECHNIQUE: MRI of the brain with and without contrast was obtained  utilizing 5 mm axial slices with T1, T2, T2 flair, T2 star gradient echo  and diffusion weighted views.  T1 sagittal, T2 coronal and postcontrast  views in the axial and coronal plane were obtained. CONTRAST: 20 ml iv multihance IMAGING SITE: [facility]  FINDINGS:  The brain parenchyma shows tiny scattered periventricular, subcortical as  well as pontine white matter hyperintensities on T2/FLAIR which are likely  from small vessel  disease.  Other differentials include vasculitis,  demyelinating, autoimmune or infectious conditions are felt to be less  likely.  None of these show any postcontrast enhancement.  The paranasal  sinuses show mild chronic inflammatory changes and nasal septum is  deviated to the right.No abnormal lesions are seen on diffusion-weighted  views to suggest acute ischemia. The cortical sulci, fissures and cisterns  are normal in size and appearance. Lateral, third and fourth ventricle are  normal in size and appearance. No extra-axial fluid collections are seen.  No evidence of mass effect or midline shift.  No abnormal lesions are seen  on post contrast views.  On sagittal views the posterior fossa, pituitary  gland and corpus callosum are unremarkable. No evidence of intracranial  hemorrhage on gradient-echo views. The orbits and their contents,  paranasal sinuses and calvarium are unremarkable.  Intracranial flow voids  are present. though posterior circulation flow voids appear to be  hypoplastic. Impression: Unremarkable MRI scan of the brain with and without contrast  showing only mild nonspecific white matter hyperintensities likely from  small vessel disease.  INTERPRETING PHYSICIAN:  PRAMOD SETHI, MD Certified in  Neuroimaging by [place] [employer] and [facility] for Neurological Subspecialities    Past Medical History:  Diagnosis Date   Anxiety    Arthritis    Bronchitis 7353   Hx of   Complication of anesthesia    Depression    takes Cymbalta daily   Depression    Depression with anxiety    Diabetes mellitus without complication ([place])    takes Metformin daily   Diabetes mellitus without complication ([place])    Type II   Family history of adverse reaction to anesthesia    patients mother and sister gets sick   Family history of anesthesia complication    mom and sister get very sick   Fibromyalgia    History of bronchitis 72yr ago   History of  shingles    Hyperlipidemia    takes Fish Oil bid   Hyperlipidemia    Insomnia    has Ambien if needed   Joint pain    Nontoxic (diffuse) goiter    Peripheral neuropathy    takes Lyrica daily   Pneumonia 444yrago   hx of   Pneumonia    hx of   Polymyalgia rheumatica (HCC)    PONV (postoperative nausea and  vomiting)    Pure hyperglyceridemia    Thyroid nodule    Type 2 diabetes mellitus with diabetic polyneuropathy (HCC)    Urgency of urination    Leakage wears pad   Past Surgical History:  Procedure Laterality Date   BACK SURGERY  1989   lumbar disc   CARPAL TUNNEL RELEASE Right 1985   CATARACT EXTRACTION  2020   CERVICAL FUSION  2009   CHOLECYSTECTOMY  2007   COLONOSCOPY     ESOPHAGOGASTRODUODENOSCOPY     EYE SURGERY Bilateral 2007   laser eye surgery    HAMMER TOE SURGERY  2019   HIP ARTHROPLASTY Bilateral    REFRACTIVE SURGERY  2008   TOTAL HIP ARTHROPLASTY Right 11/20/2013   Procedure: RIGHT TOTAL HIP ARTHROPLASTY;  Surgeon: Kerin Salen, MD;  Location: [place] [place];  Service: Orthopedics;  Laterality: Right;   TOTAL HIP ARTHROPLASTY Left 08/30/2014   Procedure: TOTAL HIP ARTHROPLASTY;  Surgeon: Kerin Salen, MD;  Location: [place];  Service: Orthopedics;  Laterality: Left;   TOTAL KNEE ARTHROPLASTY Left 06/06/2015   Procedure: TOTAL KNEE ARTHROPLASTY;  Surgeon: Frederik Pear, MD;  Location: [place];  Service: Orthopedics;  Laterality: Left;   TOTAL KNEE ARTHROPLASTY Right 09/23/2017   Procedure: TOTAL KNEE ARTHROPLASTY;  Surgeon: Frederik Pear, MD;  Location: [place];  Service: Orthopedics;  Laterality: Right;   TUBAL LIGATION  1987   TUMOR REMOVAL Right    Hand   tumor removed from right hand  2011   ulnar nerve release Left 1996   Family History  Problem Relation Age of Onset   Alzheimer's disease Mother    Kidney failure Father    Diabetes Father    Asthma Daughter    Healthy Daughter    Social History   Tobacco Use   Smoking status: Former   Smokeless tobacco:  Never   Tobacco comments:    quit smoking in 1997  Vaping Use   Vaping Use: Never used  Substance Use Topics   Alcohol use: Not Currently    Comment: rare beer   Drug use: No   Current Outpatient Medications  Medication Sig Dispense Refill   acetaminophen (TYLENOL) 650 MG CR tablet Take 650-1,300 mg by mouth 2 (two) times daily as needed for pain.     albuterol (VENTOLIN HFA) 108 (90 Base) MCG/ACT inhaler INHALE 2 PUFFS INTO THE LUNGS EVERY 6 HOURS AS NEEDED FOR WHEEZING OR SHORTNESS OF BREATH 8.5 g 1   aspirin EC 81 MG tablet Take 1 tablet (81 mg total) by mouth daily. 30 tablet 0   buPROPion (WELLBUTRIN XL) 300 MG 24 hr tablet TAKE 1 TABLET(300 MG) BY MOUTH DAILY 90 tablet 1   Continuous Blood Gluc Receiver (FREESTYLE LIBRE 14 DAY READER) DEVI none 1 each 0   Continuous Blood Gluc Sensor (FREESTYLE LIBRE 14 DAY SENSOR) MISC none 1 each 2   DULoxetine (CYMBALTA) 60 MG capsule TAKE 1 CAPSULE BY MOUTH TWICE DAILY 180 capsule 0   FREESTYLE LITE test strip CHECK BLOOD SUGAR EVERY DAY 100 strip 1   gabapentin (NEURONTIN) 300 MG capsule TAKE 1 CAPSULE BY MOUTH EVERY MORNING THEN TAKE 2 CAPSULES BY MOUTH EVERY NIGHT AT BEDTIME 90 capsule 1   glimepiride (AMARYL) 4 MG tablet TAKE 1 TABLET BY MOUTH EVERY DAY 90 tablet 0   icosapent Ethyl (VASCEPA) 1 g capsule TAKE 2 CAPSULES BY MOUTH TWICE DAILY WITH FOOD 120 capsule 3   JANUMET XR [phone] MG TB24 TAKE 1 TABLET  BY MOUTH DAILY WITH SUPPER 90 tablet 0   methocarbamol (ROBAXIN) 500 MG tablet TAKE 1 TABLET(500 MG) BY MOUTH DAILY AS NEEDED FOR MUSCLE SPASMS 30 tablet 3   Multiple Vitamin (MULTIVITAMIN WITH MINERALS) TABS tablet Take 1 tablet by mouth daily.     omeprazole (PRILOSEC) 40 MG capsule TAKE 1 CAPSULE(40 MG) BY MOUTH DAILY 90 capsule 0   rizatriptan (MAXALT-MLT) 10 MG disintegrating tablet DISSOLVE 1 TABLET ON TONGUE, MAY REPEAT IN 2 HOURS IF NEEDED, MAX OF 3 IN 24 HOURS 18 tablet 2   Vitamin D, Ergocalciferol, (DRISDOL) 1.25 MG (50000  UNIT) CAPS capsule TAKE 1 CAPSULE BY MOUTH EVERY 7 DAYS 5 capsule 5   Current Facility-Administered Medications  Medication Dose Route Frequency Provider Last Rate Last Admin   ipratropium-albuterol (DUONEB) 0.5-2.5 (3) MG/3ML nebulizer solution 3 mL  3 mL Nebulization Once Marge Duncans, PA-C       Allergies  Allergen Reactions   Morphine And Related Nausea And Vomiting    "just don't tolerate it well"   Zestril [Lisinopril] Cough     Review of Systems: All systems reviewed and negative except where noted in HPI.   Wt Readings from Last 3 Encounters:  01/24/22 219 lb 6.4 oz (99.5 kg)  12/08/21 225 lb (102.1 kg)  10/04/21 227 lb (103 kg)    Physical Exam   BP 120/72   Pulse (!) 103   Ht '5\' 3"'$  (1.6 m)   Wt 220 lb (99.8 kg)   SpO2 96%   BMI 38.97 kg/m  Constitutional:  Generally well appearing female in no acute distress. Psychiatric: Pleasant. Normal mood and affect. Behavior is normal. EENT: Pupils normal.  Conjunctivae are normal. No scleral icterus. Neck supple.  Cardiovascular: Normal rate, regular rhythm. No edema Pulmonary/chest: Effort normal and breath sounds normal. No wheezing, rales or rhonchi. Abdominal: Soft, nondistended, nontender. Bowel sounds active throughout. There are no masses palpable. No hepatomegaly. Neurological: Alert and oriented to person place and time. Skin: Skin is warm and dry. No rashes noted.  [name], NP  02/06/2022, 10:59 AM  Cc:  Referring Provider Rochel Brome, MD

## 2022-02-06 NOTE — Patient Instructions (Addendum)
If you are age 69 or older, your body mass index should be between 23-30. Your Body mass index is 38.97 kg/m. If this is out of the aforementioned range listed, please consider follow up with your Primary Care Provider. ________________________________________________________  The [facility] providers would like to encourage you to use [facility] to communicate with providers for non-urgent requests or questions.  Due to long hold times on the telephone, sending your provider a message by [facility] may be a faster and more efficient way to get a response.  Please allow 48 business hours for a response.  Please remember that this is for non-urgent requests.  _______________________________________________________  Madison Stein have been scheduled for an endoscopy and colonoscopy. Please follow the written instructions given to you at your visit today. Please pick up your prep supplies at the pharmacy within the next 1-3 days. If you use inhalers (even only as needed), please bring them with you on the day of your procedure.  Your provider has requested that you go to the basement level for lab work before leaving today. Press "B" on the elevator. The lab is located at the first door on the left as you exit the elevator.  Okay to continue daily baby aspirin Hold Ibuprofen for now Hold iron .  Continue daily Omeprazole.   Follow up pending the results of your Endoscopy/Colonoscopy.  Thank you for entrusting me with your care and choosing [facility].  [name], NP-C

## 2022-02-07 ENCOUNTER — Ambulatory Visit (INDEPENDENT_AMBULATORY_CARE_PROVIDER_SITE_OTHER): Payer: PPO | Admitting: Physician Assistant

## 2022-02-07 ENCOUNTER — Other Ambulatory Visit: Payer: Self-pay | Admitting: Physician Assistant

## 2022-02-07 ENCOUNTER — Encounter: Payer: Self-pay | Admitting: Physician Assistant

## 2022-02-07 VITALS — BP 118/58 | HR 99 | Temp 97.0°F | Ht 63.0 in | Wt 219.6 lb

## 2022-02-07 DIAGNOSIS — J1282 Pneumonia due to coronavirus disease 2019: Secondary | ICD-10-CM | POA: Diagnosis not present

## 2022-02-07 DIAGNOSIS — D649 Anemia, unspecified: Secondary | ICD-10-CM | POA: Diagnosis not present

## 2022-02-07 DIAGNOSIS — R899 Unspecified abnormal finding in specimens from other organs, systems and tissues: Secondary | ICD-10-CM

## 2022-02-07 DIAGNOSIS — U071 COVID-19: Secondary | ICD-10-CM | POA: Diagnosis not present

## 2022-02-07 NOTE — Progress Notes (Signed)
Subjective:  Patient ID: Madison Stein, female    DOB: 17-Nov-1950  Age: 71 y.o. MRN: [id number]  Chief Complaint  Patient presents with   Hospitalization Follow-up    Covid    HPI  Pt in today for hospital follow up after being treated and evaluated for COVID pneumonia.  She had been seen in the office for dizziness, weakness that had been going on for a few weeks and thought to have been caused by anemia (history of low hemoglobin and positive hemoccults) She was treated with IV hydration and prednisone and symptoms resolved.  She has since seen GI and is scheduled for colonoscopy and endoscopy next week (recent cbc and iron studies were done 2 days ago) Pt denies any other concerns or problems today - it is noted on CBC done by GI her WBC is slightly elevated - recommend to recheck in 2 weeks  Current Outpatient Medications on File Prior to Visit  Medication Sig Dispense Refill   acetaminophen (TYLENOL) 650 MG CR tablet Take 650-1,300 mg by mouth 2 (two) times daily as needed for pain.     albuterol (VENTOLIN HFA) 108 (90 Base) MCG/ACT inhaler INHALE 2 PUFFS INTO THE LUNGS EVERY 6 HOURS AS NEEDED FOR WHEEZING OR SHORTNESS OF BREATH 8.5 g 1   aspirin EC 81 MG tablet Take 1 tablet (81 mg total) by mouth daily. 30 tablet 0   buPROPion (WELLBUTRIN XL) 300 MG 24 hr tablet TAKE 1 TABLET(300 MG) BY MOUTH DAILY 90 tablet 1   Continuous Blood Gluc Receiver (FREESTYLE LIBRE 14 DAY READER) DEVI none 1 each 0   Continuous Blood Gluc Sensor (FREESTYLE LIBRE 14 DAY SENSOR) MISC none 1 each 2   dexamethasone (DECADRON) 6 MG tablet Take 6 mg by mouth daily.     DULoxetine (CYMBALTA) 60 MG capsule TAKE 1 CAPSULE BY MOUTH TWICE DAILY 180 capsule 0   FEROSUL 325 (65 Fe) MG tablet Take 325 mg by mouth daily.     FREESTYLE LITE test strip CHECK BLOOD SUGAR EVERY DAY 100 strip 1   gabapentin (NEURONTIN) 300 MG capsule TAKE 1 CAPSULE BY MOUTH EVERY MORNING THEN TAKE 2 CAPSULES BY MOUTH EVERY NIGHT AT BEDTIME  90 capsule 1   glimepiride (AMARYL) 4 MG tablet TAKE 1 TABLET BY MOUTH EVERY DAY 90 tablet 0   icosapent Ethyl (VASCEPA) 1 g capsule TAKE 2 CAPSULES BY MOUTH TWICE DAILY WITH FOOD 120 capsule 3   JANUMET XR [id number] MG TB24 TAKE 1 TABLET BY MOUTH DAILY WITH SUPPER 90 tablet 0   methocarbamol (ROBAXIN) 500 MG tablet TAKE 1 TABLET(500 MG) BY MOUTH DAILY AS NEEDED FOR MUSCLE SPASMS 30 tablet 3   Multiple Vitamin (MULTIVITAMIN WITH MINERALS) TABS tablet Take 1 tablet by mouth daily.     omeprazole (PRILOSEC) 40 MG capsule TAKE 1 CAPSULE(40 MG) BY MOUTH DAILY 90 capsule 0   rizatriptan (MAXALT-MLT) 10 MG disintegrating tablet DISSOLVE 1 TABLET ON TONGUE, MAY REPEAT IN 2 HOURS IF NEEDED, MAX OF 3 IN 24 HOURS 18 tablet 2   Vitamin D, Ergocalciferol, (DRISDOL) 1.25 MG (50000 UNIT) CAPS capsule TAKE 1 CAPSULE BY MOUTH EVERY 7 DAYS 5 capsule 5   Current Facility-Administered Medications on File Prior to Visit  Medication Dose Route Frequency Provider Last Rate Last Admin   ipratropium-albuterol (DUONEB) 0.5-2.5 (3) MG/3ML nebulizer solution 3 mL  3 mL Nebulization Once Marge Duncans, PA-C       Past Medical History:  Diagnosis Date   Anxiety  Arthritis    Bronchitis 4098   Hx of   Complication of anesthesia    Depression    takes Cymbalta daily   Depression    Depression with anxiety    Diabetes mellitus without complication ([place])    takes Metformin daily   Diabetes mellitus without complication ([place])    Type II   Family history of adverse reaction to anesthesia    patients mother and sister gets sick   Family history of anesthesia complication    mom and sister get very sick   Fibromyalgia    History of bronchitis 52yr ago   History of shingles    Hyperlipidemia    takes Fish Oil bid   Hyperlipidemia    Insomnia    has Ambien if needed   Joint pain    Nontoxic (diffuse) goiter    Peripheral neuropathy    takes Lyrica daily   Pneumonia 456yrago   hx of   Pneumonia    hx of    Polymyalgia rheumatica (HCC)    PONV (postoperative nausea and vomiting)    Pure hyperglyceridemia    Thyroid nodule    Type 2 diabetes mellitus with diabetic polyneuropathy (HCC)    Urgency of urination    Leakage wears pad   Past Surgical History:  Procedure Laterality Date   BACK SURGERY  1989   lumbar disc   CARPAL TUNNEL RELEASE Right 1985   CATARACT EXTRACTION  2020   CERVICAL FUSION  2009   CHOLECYSTECTOMY  2007   COLONOSCOPY     ESOPHAGOGASTRODUODENOSCOPY     EYE SURGERY Bilateral 2007   laser eye surgery    HAMMER TOE SURGERY  2019   HIP ARTHROPLASTY Bilateral    REFRACTIVE SURGERY  2008   TOTAL HIP ARTHROPLASTY Right 11/20/2013   Procedure: RIGHT TOTAL HIP ARTHROPLASTY;  Surgeon: FrKerin SalenMD;  [facility];  Laterality: Right;   TOTAL HIP ARTHROPLASTY Left 08/30/2014   Procedure: TOTAL HIP ARTHROPLASTY;  Surgeon: FrKerin SalenMD;  [facility];  Laterality: Left;   TOTAL KNEE ARTHROPLASTY Left 06/06/2015   Procedure: TOTAL KNEE ARTHROPLASTY;  Surgeon: FrFrederik PearMD;  [facility];  Laterality: Left;   TOTAL KNEE ARTHROPLASTY Right 09/23/2017   Procedure: TOTAL KNEE ARTHROPLASTY;  Surgeon: RoFrederik PearMD;  [facility];  Laterality: Right;   TUBAL LIGATION  1987   TUMOR REMOVAL Right    Hand   tumor removed from right hand  2011   ulnar nerve release Left 1996    Family History  Problem Relation Age of Onset   Alzheimer's disease Mother    Fibromyalgia Mother    Kidney failure Father    Diabetes Father    High Cholesterol Father    Asthma Daughter    Healthy Daughter    Colon cancer Neg Hx    Stomach cancer Neg Hx    Esophageal cancer Neg Hx    Colon polyps Neg Hx    Social History   Socioeconomic History   Marital status: Divorced    Spouse name: Not on file   Number of children: 1   Years of education: Not on file   Highest education  level: Some college, no degree  Occupational History   Occupation: Retired  Tobacco Use   Smoking status: Former   Smokeless tobacco: Never   Tobacco comments:    quit  smoking in 1997  Vaping Use   Vaping Use: Never used  Substance and Sexual Activity   Alcohol use: Not Currently    Comment: rare beer   Drug use: No   Sexual activity: Never    Birth control/protection: Post-menopausal  Other Topics Concern   Not on file  Social History Narrative   Lives alone   Left handed   Caffeine: at the most 2 cups of coffee/day      ** Merged History Encounter **       Social Determinants of Health   Financial Resource Strain: Not on file  Food Insecurity: Not on file  Transportation Needs: Not on file  Physical Activity: Not on file  Stress: Not on file  Social Connections: Not on file    Review of Systems CONSTITUTIONAL: Negative for chills, fatigue, fever, E/N/T: Negative for ear pain, nasal congestion and sore throat.  CARDIOVASCULAR: Negative for chest pain, dizziness,  RESPIRATORY: Negative for recent cough and dyspnea.  GASTROINTESTINAL: Negative for abdominal pain, acid reflux symptoms, constipation, diarrhea, nausea and vomiting.   INTEGUMENTARY: Negative for rash.        Objective:  PHYSICAL EXAM:   VS: BP (!) 118/58 (BP Location: Left Arm, Patient Position: Sitting, Cuff Size: Normal)   Pulse 99   Temp (!) 97 F (36.1 C) (Temporal)   Ht '5\' 3"'$  (1.6 m)   Wt 219 lb 9.6 oz (99.6 kg)   SpO2 95%   BMI 38.90 kg/m   GEN: Well nourished, well developed, in no acute distress   Cardiac: RRR; no murmurs, rubs, or gallops,no edema -  Respiratory:  normal respiratory rate and pattern with no distress - normal breath sounds with no rales, rhonchi, wheezes or rubs  MS: no deformity or atrophy  Skin: warm and dry, no rash   Psych: euthymic mood, appropriate affect and demeanor   Lab Results  Component Value Date   WBC 15.1 (H) 02/06/2022   HGB 10.4 (L)  02/06/2022   HCT 34.2 (L) 02/06/2022   PLT 406.0 (H) 02/06/2022   GLUCOSE 181 (H) 12/08/2021   CHOL 153 12/08/2021   TRIG 131 12/08/2021   HDL 55 12/08/2021   LDLCALC 75 12/08/2021   ALT 18 12/08/2021   AST 16 12/08/2021   NA 139 12/08/2021   K 5.2 12/08/2021   CL 102 12/08/2021   CREATININE 0.95 12/08/2021   BUN 14 12/08/2021   CO2 24 12/08/2021   TSH 0.850 12/08/2021   INR 0.98 09/12/2017   HGBA1C 7.3 (H) 12/08/2021   MICROALBUR 10 03/30/2020      Assessment & Plan:   Problem List Items Addressed This Visit       Respiratory   Pneumonia due to COVID-19 virus - Primary - resolved     Other   Anemia Follow up with GI as scheduled  Follow up for chronic visit as scheduled  .  No orders of the defined types were placed in this encounter.   No orders of the defined types were placed in this encounter.    Follow-up: Return in about 2 weeks (around 02/21/2022) for nurse visit labwork.  An After Visit Summary was printed and given to the patient.  Yetta Flock [facility] [phone]

## 2022-02-08 ENCOUNTER — Other Ambulatory Visit: Payer: Self-pay

## 2022-02-08 DIAGNOSIS — R195 Other fecal abnormalities: Secondary | ICD-10-CM

## 2022-02-08 DIAGNOSIS — D509 Iron deficiency anemia, unspecified: Secondary | ICD-10-CM

## 2022-02-08 NOTE — Progress Notes (Signed)
Reviewed and agree with management plans. ? ?Ikhlas Albo L. Melburn Treiber, MD, MPH  ?

## 2022-02-09 ENCOUNTER — Inpatient Hospital Stay: Payer: PPO | Admitting: Nurse Practitioner

## 2022-02-12 ENCOUNTER — Encounter: Payer: Self-pay | Admitting: Gastroenterology

## 2022-02-12 ENCOUNTER — Ambulatory Visit (AMBULATORY_SURGERY_CENTER): Payer: PPO | Admitting: Gastroenterology

## 2022-02-12 VITALS — BP 114/48 | HR 89 | Temp 97.7°F | Resp 21 | Ht 63.0 in | Wt 220.0 lb

## 2022-02-12 DIAGNOSIS — D122 Benign neoplasm of ascending colon: Secondary | ICD-10-CM | POA: Diagnosis not present

## 2022-02-12 DIAGNOSIS — K648 Other hemorrhoids: Secondary | ICD-10-CM

## 2022-02-12 DIAGNOSIS — D509 Iron deficiency anemia, unspecified: Secondary | ICD-10-CM | POA: Diagnosis not present

## 2022-02-12 DIAGNOSIS — R195 Other fecal abnormalities: Secondary | ICD-10-CM

## 2022-02-12 DIAGNOSIS — K2990 Gastroduodenitis, unspecified, without bleeding: Secondary | ICD-10-CM

## 2022-02-12 DIAGNOSIS — K2970 Gastritis, unspecified, without bleeding: Secondary | ICD-10-CM

## 2022-02-12 MED ORDER — SODIUM CHLORIDE 0.9 % IV SOLN: 500.0000 mL | INTRAVENOUS | Status: DC

## 2022-02-12 NOTE — Progress Notes (Signed)
Called to room to assist during endoscopic procedure.  Patient ID and intended procedure confirmed with present staff. Received instructions for my participation in the procedure from the performing physician.  

## 2022-02-12 NOTE — Op Note (Signed)
[place] Patient Name: Madison Stein Procedure Date: 02/12/2022 3:01 PM MRN: [id number] Endoscopist: Thornton Park MD, MD Age: 71 Referring MD:  Date of Birth: [birth year] Gender: Female Account #: [id number] Procedure:                Colonoscopy Indications:              Unexplained iron deficiency anemia Medicines:                Monitored Anesthesia Care Procedure:                Pre-Anesthesia Assessment:                           - Prior to the procedure, a History and Physical                            was performed, and patient medications and                            allergies were reviewed. The patient's tolerance of                            previous anesthesia was also reviewed. The risks                            and benefits of the procedure and the sedation                            options and risks were discussed with the patient.                            All questions were answered, and informed consent                            was obtained. Prior Anticoagulants: The patient has                            taken no previous anticoagulant or antiplatelet                            agents. ASA Grade Assessment: III - A patient with                            severe systemic disease. After reviewing the risks                            and benefits, the patient was deemed in                            satisfactory condition to undergo the procedure.                           After obtaining informed consent, the colonoscope  was passed under direct vision. Throughout the                            procedure, the patient's blood pressure, pulse, and                            oxygen saturations were monitored continuously. The                            Olympus CF-HQ190L [id number]) Colonoscope was                            introduced through the anus and advanced to the 3                            cm into the ileum. A  second forward view of the                            right colon was performed. The colonoscopy was                            performed without difficulty. The patient tolerated                            the procedure well. The quality of the bowel                            preparation was good. The terminal ileum, ileocecal                            valve, appendiceal orifice, and rectum were                            photographed. Scope In: 3:30:52 PM Scope Out: 3:50:18 PM Scope Withdrawal Time: 0 hours 17 minutes 19 seconds  Total Procedure Duration: 0 hours 19 minutes 26 seconds  Findings:                 Non-bleeding external and internal hemorrhoids were                            found.                           Two sessile polyps were found in the ascending                            colon. The polyps were 2 to 3 mm in size. These                            polyps were removed with a cold snare. Resection                            and retrieval were complete. Estimated blood loss  was minimal.                           The exam was otherwise without abnormality on                            direct and retroflexion views. Complications:            No immediate complications. Estimated Blood Loss:     Estimated blood loss was minimal. Impression:               - Non-bleeding external and internal hemorrhoids.                           - Two 2 to 3 mm polyps in the ascending colon,                            removed with a cold snare. Resected and retrieved.                           - The examination was otherwise normal on direct                            and retroflexion views. Recommendation:           - Patient has a contact number available for                            emergencies. The signs and symptoms of potential                            delayed complications were discussed with the                            patient. Return to normal  activities tomorrow.                            Written discharge instructions were provided to the                            patient.                           - Resume previous diet.                           - Continue present medications.                           - Await pathology results.                           - Repeat colonoscopy date to be determined after                            pending pathology results are reviewed for  surveillance.                           - Emerging evidence supports eating a diet of                            fruits, vegetables, grains, calcium, and yogurt                            while reducing red meat and alcohol may reduce the                            risk of colon cancer. Thornton Park MD, MD 02/12/2022 4:00:17 PM This report has been signed electronically.

## 2022-02-12 NOTE — Op Note (Signed)
[place] Patient Name: Madison Stein Procedure Date: 02/12/2022 3:09 PM MRN: [id number] Endoscopist: Thornton Park MD, MD Age: 71 Referring MD:  Date of Birth: Jun 11, 1951 Gender: Female Account #: [id number] Procedure:                Upper GI endoscopy Indications:              Unexplained iron deficiency anemia Medicines:                Monitored Anesthesia Care Procedure:                Pre-Anesthesia Assessment:                           - Prior to the procedure, a History and Physical                            was performed, and patient medications and                            allergies were reviewed. The patient's tolerance of                            previous anesthesia was also reviewed. The risks                            and benefits of the procedure and the sedation                            options and risks were discussed with the patient.                            All questions were answered, and informed consent                            was obtained. Prior Anticoagulants: The patient has                            taken no previous anticoagulant or antiplatelet                            agents. ASA Grade Assessment: III - A patient with                            severe systemic disease. After reviewing the risks                            and benefits, the patient was deemed in                            satisfactory condition to undergo the procedure.                           After obtaining informed consent, the endoscope was  passed under direct vision. Throughout the                            procedure, the patient's blood pressure, pulse, and                            oxygen saturations were monitored continuously. The                            GIF HQ190 #[id number] was introduced through the                            mouth, and advanced to the third part of duodenum.                            The upper GI  endoscopy was accomplished without                            difficulty. The patient tolerated the procedure                            well. Scope In: Scope Out: Findings:                 The esophagus was normal. The z-line is located 36                            cm from the incisors.                           Striped mildly erythematous mucosa without bleeding                            was found in the gastric body. Biopsies were taken                            from the antrum, body, and fundus with a cold                            forceps for histology. Estimated blood loss was                            minimal.                           The examined duodenum was normal. Biopsies were                            taken with a cold forceps for histology. Estimated                            blood loss was minimal.                           The cardia and gastric fundus were normal on  retroflexion.                           The exam was otherwise without abnormality. Complications:            No immediate complications. Estimated Blood Loss:     Estimated blood loss was minimal. Impression:               - Normal esophagus.                           - Erythematous mucosa in the gastric body. Biopsied.                           - Normal examined duodenum. Biopsied.                           - The examination was otherwise normal. Recommendation:           - Patient has a contact number available for                            emergencies. The signs and symptoms of potential                            delayed complications were discussed with the                            patient. Return to normal activities tomorrow.                            Written discharge instructions were provided to the                            patient.                           - Resume previous diet.                           - Continue present medications.                            - Await pathology results.                           - No aspirin, ibuprofen, naproxen, or other                            non-steroidal anti-inflammatory drugs. Thornton Park MD, MD 02/12/2022 3:52:26 PM This report has been signed electronically.

## 2022-02-12 NOTE — Patient Instructions (Signed)
Handouts on hemorrhoids and polyps given to patient. Await pathology results. Resume previous diet and continue present medications. Avoid NSAID's (naproxen, ibuprofen, aspirin, and any other non-steroidal anti-inflammatory medications) Repeat colonoscopy for surveillance will be determined based off of pathology results.   YOU HAD AN ENDOSCOPIC PROCEDURE TODAY AT [facility]:   Refer to the procedure report that was given to you for any specific questions about what was found during the examination.  If the procedure report does not answer your questions, please call your gastroenterologist to clarify.  If you requested that your care partner not be given the details of your procedure findings, then the procedure report has been included in a sealed envelope for you to review at your convenience later.  YOU SHOULD EXPECT: Some feelings of bloating in the abdomen. Passage of more gas than usual.  Walking can help get rid of the air that was put into your GI tract during the procedure and reduce the bloating. If you had a lower endoscopy (such as a colonoscopy or flexible sigmoidoscopy) you may notice spotting of blood in your stool or on the toilet paper. If you underwent a bowel prep for your procedure, you may not have a normal bowel movement for a few days.  Please Note:  You might notice some irritation and congestion in your nose or some drainage.  This is from the oxygen used during your procedure.  There is no need for concern and it should clear up in a day or so.  SYMPTOMS TO REPORT IMMEDIATELY:  Following lower endoscopy (colonoscopy or flexible sigmoidoscopy):  Excessive amounts of blood in the stool  Significant tenderness or worsening of abdominal pains  Swelling of the abdomen that is new, acute  Fever of 100F or higher  Following upper endoscopy (EGD)  Vomiting of blood or coffee ground material  New chest pain or pain under the shoulder blades  Painful or  persistently difficult swallowing  New shortness of breath  Fever of 100F or higher  Black, tarry-looking stools  For urgent or emergent issues, a gastroenterologist can be reached at any hour by calling [phone]. Do not use MyChart messaging for urgent concerns.    DIET:  We do recommend a small meal at first, but then you may proceed to your regular diet.  Drink plenty of fluids but you should avoid alcoholic beverages for 24 hours.  ACTIVITY:  You should plan to take it easy for the rest of today and you should NOT DRIVE or use heavy machinery until tomorrow (because of the sedation medicines used during the test).    FOLLOW UP: Our staff will call the number listed on your records the next business day following your procedure.  We will call around 7:15- 8:00 am to check on you and address any questions or concerns that you may have regarding the information given to you following your procedure. If we do not reach you, we will leave a message.  If you develop any symptoms (ie: fever, flu-like symptoms, shortness of breath, cough etc.) before then, please call [phone].  If you test positive for Covid 19 in the 2 weeks post procedure, please call and report this information to [place].    If any biopsies were taken you will be contacted by phone or by letter within the next 1-3 weeks.  Please call us at [phone] if you have not heard about the biopsies in 3 weeks.    SIGNATURES/CONFIDENTIALITY: You and/or your  care partner have signed paperwork which will be entered into your electronic medical record.  These signatures attest to the fact that that the information above on your After Visit Summary has been reviewed and is understood.  Full responsibility of the confidentiality of this discharge information lies with you and/or your care-partner.

## 2022-02-12 NOTE — Progress Notes (Signed)
Pts blood sugar elevated at 310.  Pt denies any symptoms.  Reported to Dr. Tarri Glenn and Hebert Soho, CRNA.  Will proceed with procedure.

## 2022-02-12 NOTE — Progress Notes (Signed)
Pt in recovery with monitors in place, VSS. Report given to receiving RN. Bite guard was placed with pt awake to ensure comfort. No dental or soft tissue damage noted. 

## 2022-02-12 NOTE — Progress Notes (Signed)
Indication for upper endoscopy: Iron deficiency anemia Indication for colonoscopy: Iron deficiency anemia  Please see the 02/06/2022 office note for complete details.  There is been no change in history or physical exam.  The patient remains an appropriate candidate for monitored anesthesia care in the [facility].

## 2022-02-13 ENCOUNTER — Telehealth: Payer: Self-pay

## 2022-02-13 NOTE — Telephone Encounter (Signed)
Left message on follow up call. 

## 2022-02-15 ENCOUNTER — Telehealth: Payer: Self-pay

## 2022-02-15 NOTE — Telephone Encounter (Signed)
-----   Message from Marice Potter, RN sent at 02/08/2022  1:35 PM EDT ----- Pt needs another CBC. Order in epic.

## 2022-02-15 NOTE — Telephone Encounter (Signed)
MyChart message sent to patient with lab reminder.  

## 2022-02-15 NOTE — Telephone Encounter (Signed)
Patient has reviewed MyChart message. Last read by Delmer Islam at 11:24 AM on 02/15/2022.

## 2022-02-17 ENCOUNTER — Encounter: Payer: Self-pay | Admitting: Gastroenterology

## 2022-02-21 ENCOUNTER — Other Ambulatory Visit: Payer: PPO

## 2022-02-21 DIAGNOSIS — R899 Unspecified abnormal finding in specimens from other organs, systems and tissues: Secondary | ICD-10-CM

## 2022-02-21 LAB — CBC WITH DIFFERENTIAL/PLATELET
Basophils Absolute: 0.1 10*3/uL (ref 0.0–0.2)
Basos: 1 %
EOS (ABSOLUTE): 0.4 10*3/uL (ref 0.0–0.4)
Eos: 5 %
Hematocrit: 34.8 % (ref 34.0–46.6)
Hemoglobin: 10 g/dL — ABNORMAL LOW (ref 11.1–15.9)
Immature Grans (Abs): 0.1 10*3/uL (ref 0.0–0.1)
Immature Granulocytes: 1 %
Lymphocytes Absolute: 1.8 10*3/uL (ref 0.7–3.1)
Lymphs: 21 %
MCH: 21.6 pg — ABNORMAL LOW (ref 26.6–33.0)
MCHC: 28.7 g/dL — ABNORMAL LOW (ref 31.5–35.7)
MCV: 75 fL — ABNORMAL LOW (ref 79–97)
Monocytes Absolute: 0.7 10*3/uL (ref 0.1–0.9)
Monocytes: 8 %
Neutrophils Absolute: 5.6 10*3/uL (ref 1.4–7.0)
Neutrophils: 64 %
Platelets: 350 10*3/uL (ref 150–450)
RBC: 4.64 x10E6/uL (ref 3.77–5.28)
RDW: 20.6 % — ABNORMAL HIGH (ref 11.7–15.4)
WBC: 8.6 10*3/uL (ref 3.4–10.8)

## 2022-02-22 ENCOUNTER — Other Ambulatory Visit: Payer: Self-pay | Admitting: Physician Assistant

## 2022-02-22 DIAGNOSIS — D649 Anemia, unspecified: Secondary | ICD-10-CM

## 2022-03-07 ENCOUNTER — Other Ambulatory Visit: Payer: Self-pay | Admitting: Family Medicine

## 2022-03-07 DIAGNOSIS — E1142 Type 2 diabetes mellitus with diabetic polyneuropathy: Secondary | ICD-10-CM

## 2022-03-08 ENCOUNTER — Other Ambulatory Visit: Payer: Self-pay | Admitting: Physician Assistant

## 2022-03-08 ENCOUNTER — Other Ambulatory Visit: Payer: PPO

## 2022-03-09 ENCOUNTER — Other Ambulatory Visit: Payer: PPO

## 2022-03-09 ENCOUNTER — Other Ambulatory Visit: Payer: Self-pay

## 2022-03-09 DIAGNOSIS — D649 Anemia, unspecified: Secondary | ICD-10-CM

## 2022-03-09 MED ORDER — FEROSUL 325 (65 FE) MG PO TABS
325.0000 mg | ORAL_TABLET | Freq: Every day | ORAL | 0 refills | Status: DC
Start: 2022-03-09 — End: 2022-06-07

## 2022-03-09 NOTE — Telephone Encounter (Signed)
Patient called and stated she put on medication at [facility] in July and is needing a refill

## 2022-03-10 LAB — CBC WITH DIFF/PLATELET
Basophils Absolute: 0.1 10*3/uL (ref 0.0–0.2)
Basos: 1 %
EOS (ABSOLUTE): 0.3 10*3/uL (ref 0.0–0.4)
Eos: 4 %
Hematocrit: 39.6 % (ref 34.0–46.6)
Hemoglobin: 11.4 g/dL (ref 11.1–15.9)
Immature Grans (Abs): 0.1 10*3/uL (ref 0.0–0.1)
Immature Granulocytes: 1 %
Lymphocytes Absolute: 2.2 10*3/uL (ref 0.7–3.1)
Lymphs: 24 %
MCH: 22 pg — ABNORMAL LOW (ref 26.6–33.0)
MCHC: 28.8 g/dL — ABNORMAL LOW (ref 31.5–35.7)
MCV: 76 fL — ABNORMAL LOW (ref 79–97)
Monocytes Absolute: 0.7 10*3/uL (ref 0.1–0.9)
Monocytes: 7 %
Neutrophils Absolute: 5.8 10*3/uL (ref 1.4–7.0)
Neutrophils: 63 %
Platelets: 388 10*3/uL (ref 150–450)
RBC: 5.19 x10E6/uL (ref 3.77–5.28)
RDW: 20.6 % — ABNORMAL HIGH (ref 11.7–15.4)
WBC: 9.2 10*3/uL (ref 3.4–10.8)

## 2022-03-10 LAB — IRON,TIBC AND FERRITIN PANEL
Ferritin: 35 ng/mL (ref 15–150)
Iron Saturation: 11 % — ABNORMAL LOW (ref 15–55)
Iron: 43 ug/dL (ref 27–139)
Total Iron Binding Capacity: 409 ug/dL (ref 250–450)
UIBC: 366 ug/dL (ref 118–369)

## 2022-03-13 ENCOUNTER — Ambulatory Visit: Payer: PPO | Admitting: Family Medicine

## 2022-03-17 ENCOUNTER — Other Ambulatory Visit: Payer: Self-pay | Admitting: Physician Assistant

## 2022-03-17 DIAGNOSIS — F419 Anxiety disorder, unspecified: Secondary | ICD-10-CM

## 2022-03-19 ENCOUNTER — Encounter: Payer: Self-pay | Admitting: Physician Assistant

## 2022-03-19 ENCOUNTER — Ambulatory Visit (INDEPENDENT_AMBULATORY_CARE_PROVIDER_SITE_OTHER): Payer: PPO | Admitting: Physician Assistant

## 2022-03-19 VITALS — BP 110/60 | HR 99 | Temp 96.7°F | Ht 63.0 in | Wt 216.0 lb

## 2022-03-19 DIAGNOSIS — E782 Mixed hyperlipidemia: Secondary | ICD-10-CM | POA: Diagnosis not present

## 2022-03-19 DIAGNOSIS — R0609 Other forms of dyspnea: Secondary | ICD-10-CM | POA: Insufficient documentation

## 2022-03-19 DIAGNOSIS — R42 Dizziness and giddiness: Secondary | ICD-10-CM

## 2022-03-19 DIAGNOSIS — D649 Anemia, unspecified: Secondary | ICD-10-CM

## 2022-03-19 DIAGNOSIS — E1142 Type 2 diabetes mellitus with diabetic polyneuropathy: Secondary | ICD-10-CM | POA: Diagnosis not present

## 2022-03-19 DIAGNOSIS — E559 Vitamin D deficiency, unspecified: Secondary | ICD-10-CM

## 2022-03-19 DIAGNOSIS — F419 Anxiety disorder, unspecified: Secondary | ICD-10-CM

## 2022-03-19 NOTE — Progress Notes (Signed)
Subjective:  Patient ID: Madison Stein, female    DOB: 1950/10/05  Age: 71 y.o. MRN: [id number]  Chief Complaint  Patient presents with   Diabetes    HPI  Pt with history of diabetes - she states that for the past week or more she has been out of Janumet because of problems with her insurance but will be checking into that  - will let us know if she is not able to get that medication - she is not using freestyle libre any longer and not really checking glucose She is taking glimepiride '4mg'$  States her sugars when she checks them randomly elevate at times to 300 She at one time was on actos but told to stop but she thinks perhaps she is actually taking that medication now - will go home and check meds - will bring all by to verify  Pt with history of depression with anxiety - states her symptoms are stable on current medications - she is taking wellbutrin '300mg'$  and cymbalta '60mg'$   Pt with history of iron def anemia - currently she is on daily supplements - she has had GI evaluation with normal colonoscopy (except for polyp) and endoscopy - has follow up with their office in next few weeks  Pt with history of vit D def - currently on weekly supplement  Pt states that she continues to have symptoms of fatigue and now with exertional dyspnea.  She has noted she cannot do activities like she used to do and at times feeling dizziness when standing up and moving around.  She denies chest pains  Current Outpatient Medications on File Prior to Visit  Medication Sig Dispense Refill   acetaminophen (TYLENOL) 650 MG CR tablet Take 650-1,300 mg by mouth 2 (two) times daily as needed for pain.     albuterol (VENTOLIN HFA) 108 (90 Base) MCG/ACT inhaler INHALE 2 PUFFS INTO THE LUNGS EVERY 6 HOURS AS NEEDED FOR WHEEZING OR SHORTNESS OF BREATH 8.5 g 1   aspirin EC 81 MG tablet Take 1 tablet (81 mg total) by mouth daily. 30 tablet 0   buPROPion (WELLBUTRIN XL) 300 MG 24 hr tablet TAKE 1 TABLET(300 MG) BY  MOUTH DAILY 90 tablet 1   FEROSUL 325 (65 Fe) MG tablet Take 1 tablet (325 mg total) by mouth daily. 90 tablet 0   FREESTYLE LITE test strip CHECK BLOOD SUGAR EVERY DAY 100 strip 1   gabapentin (NEURONTIN) 300 MG capsule TAKE 1 CAPSULE BY MOUTH EVERY MORNING THEN TAKE 2 CAPSULES BY MOUTH EVERY NIGHT AT BEDTIME 90 capsule 1   glimepiride (AMARYL) 4 MG tablet TAKE 1 TABLET BY MOUTH EVERY DAY 90 tablet 0   icosapent Ethyl (VASCEPA) 1 g capsule TAKE 2 CAPSULES BY MOUTH TWICE DAILY WITH FOOD 120 capsule 3   JANUMET XR [phone] MG TB24 TAKE 1 TABLET BY MOUTH DAILY WITH SUPPER 90 tablet 0   methocarbamol (ROBAXIN) 500 MG tablet TAKE 1 TABLET(500 MG) BY MOUTH DAILY AS NEEDED FOR MUSCLE SPASMS 30 tablet 3   Multiple Vitamin (MULTIVITAMIN WITH MINERALS) TABS tablet Take 1 tablet by mouth daily.     omeprazole (PRILOSEC) 40 MG capsule TAKE 1 CAPSULE(40 MG) BY MOUTH DAILY 90 capsule 0   ramipril (ALTACE) 2.5 MG capsule Take 2.5 mg by mouth daily.     rizatriptan (MAXALT-MLT) 10 MG disintegrating tablet DISSOLVE 1 TABLET ON TONGUE, MAY REPEAT AT 2 HOUR INTERVAL IF NEEDED, MAX OF 3 TABLETS IN 24 HOURS 18 tablet 2  rosuvastatin (CRESTOR) 5 MG tablet Take 5 mg by mouth daily.     Vitamin D, Ergocalciferol, (DRISDOL) 1.25 MG (50000 UNIT) CAPS capsule TAKE 1 CAPSULE BY MOUTH EVERY 7 DAYS 5 capsule 5   DULoxetine (CYMBALTA) 60 MG capsule TAKE ONE CAPSULE BY MOUTH TWICE DAILY 180 capsule 0   No current facility-administered medications on file prior to visit.   Past Medical History:  Diagnosis Date   Anemia    Anxiety    Arthritis    Bronchitis 2706   Hx of   Complication of anesthesia    Depression    takes Cymbalta daily   Depression    Depression with anxiety    Diabetes mellitus without complication ([place])    takes Metformin daily   Diabetes mellitus without complication ([place])    Type II   Family history of adverse reaction to anesthesia    patients mother and sister gets sick   Family history of  anesthesia complication    mom and sister get very sick   Fibromyalgia    History of bronchitis 70yr ago   History of shingles    Hyperlipidemia    takes Fish Oil bid   Hyperlipidemia    Insomnia    has Ambien if needed   Joint pain    Nontoxic (diffuse) goiter    Osteoporosis    Peripheral neuropathy    takes Lyrica daily   Pneumonia 439yrago   hx of   Pneumonia    hx of   Polymyalgia rheumatica (HCC)    PONV (postoperative nausea and vomiting)    Pure hyperglyceridemia    Thyroid nodule    Type 2 diabetes mellitus with diabetic polyneuropathy (HCC)    Urgency of urination    Leakage wears pad   Past Surgical History:  Procedure Laterality Date   BACK SURGERY  1989   lumbar disc   CARPAL TUNNEL RELEASE Right 1985   CATARACT EXTRACTION  2020   CERVICAL FUSION  2009   CHOLECYSTECTOMY  2007   COLONOSCOPY     ESOPHAGOGASTRODUODENOSCOPY     EYE SURGERY Bilateral 2007   laser eye surgery    HAMMER TOE SURGERY  2019   HIP ARTHROPLASTY Bilateral    REFRACTIVE SURGERY  2008   TOTAL HIP ARTHROPLASTY Right 11/20/2013   Procedure: RIGHT TOTAL HIP ARTHROPLASTY;  Surgeon: FrKerin SalenMD;  [facility];  Laterality: Right;   TOTAL HIP ARTHROPLASTY Left 08/30/2014   Procedure: TOTAL HIP ARTHROPLASTY;  Surgeon: FrKerin SalenMD;  [facility];  Laterality: Left;   TOTAL KNEE ARTHROPLASTY Left 06/06/2015   Procedure: TOTAL KNEE ARTHROPLASTY;  Surgeon: FrFrederik PearMD;  [facility];  Laterality: Left;   TOTAL KNEE ARTHROPLASTY Right 09/23/2017   Procedure: TOTAL KNEE ARTHROPLASTY;  Surgeon: RoFrederik PearMD;  [facility];  Laterality: Right;   TUBAL LIGATION  1987   TUMOR REMOVAL Right    Hand   tumor removed from right hand  2011   ulnar nerve release Left 1996    Family History  Problem Relation Age of Onset   Alzheimer's disease Mother    Fibromyalgia Mother     Kidney failure Father    Diabetes Father    High Cholesterol Father    Asthma Daughter    Healthy Daughter    Colon cancer Neg Hx    Stomach cancer Neg Hx  Esophageal cancer Neg Hx    Colon polyps Neg Hx    Social History   Socioeconomic History   Marital status: Divorced    Spouse name: Not on file   Number of children: 1   Years of education: Not on file   Highest education level: Some college, no degree  Occupational History   Occupation: Retired  Tobacco Use   Smoking status: Former   Smokeless tobacco: Never   Tobacco comments:    quit smoking in 1997  Vaping Use   Vaping Use: Never used  Substance and Sexual Activity   Alcohol use: Not Currently    Comment: rare beer   Drug use: No   Sexual activity: Never    Birth control/protection: Post-menopausal  Other Topics Concern   Not on file  Social History Narrative   Lives alone   Left handed   Caffeine: at the most 2 cups of coffee/day      ** Merged History Encounter **       Social Determinants of Health   Financial Resource Strain: Not on file  Food Insecurity: Not on file  Transportation Needs: Not on file  Physical Activity: Not on file  Stress: Not on file  Social Connections: Not on file    Review of Systems  CONSTITUTIONAL: see HPI E/N/T: Negative for ear pain, nasal congestion and sore throat.  CARDIOVASCULAR: see HPI RESPIRATORY: see HPI GASTROINTESTINAL: Negative for abdominal pain, acid reflux symptoms, constipation, diarrhea, nausea and vomiting.  MSK: Negative for arthralgias and myalgias.  INTEGUMENTARY: Negative for rash.  NEUROLOGICAL: see HPI PSYCHIATRIC: Negative for sleep disturbance and to question depression screen.  Negative for depression, negative for anhedonia.      Objective:  BP 110/60 (BP Location: Left Arm, Patient Position: Sitting, Cuff Size: Normal)   Pulse 99   Temp (!) 96.7 F (35.9 C) (Temporal)   Ht '5\' 3"'$  (1.6 m)   Wt 216 lb (98 kg)   SpO2 96%   BMI  38.26 kg/m      03/19/2022    9:17 AM 02/12/2022    4:12 PM 02/12/2022    4:02 PM  BP/Weight  Systolic BP 540 981 191  Diastolic BP 60 48 57  Wt. (Lbs) 216    BMI 38.26 kg/m2      Physical Exam PHYSICAL EXAM:   VS: BP 110/60 (BP Location: Left Arm, Patient Position: Sitting, Cuff Size: Normal)   Pulse 99   Temp (!) 96.7 F (35.9 C) (Temporal)   Ht '5\' 3"'$  (1.6 m)   Wt 216 lb (98 kg)   SpO2 96%   BMI 38.26 kg/m  Orthostatic Vitals for the past 48 hrs (Last 6 readings):  Patient Position Orthostatic BP Orthostatic Pulse  03/19/22 0917 Sitting -- --  03/19/22 0946 Supine 110/60 115  03/19/22 0947 Sitting 98/58 111  03/19/22 0948 Standing 94/56 124    GEN: Well nourished, well developed, in no acute distress   Cardiac: RRR; no murmurs, rubs, or gallops,no edema -  Respiratory:  normal respiratory rate and pattern with no distress - normal breath sounds with no rales, rhonchi, wheezes or rubs  MS: no deformity or atrophy  Skin: warm and dry, no rash  Neuro:  Alert and Oriented x 3,  CN II-Xii grossly intact Psych: euthymic mood, appropriate affect and demeanor  EKG - sinus tach noted - no acute changes  Lab Results  Component Value Date   WBC 9.2 03/09/2022   HGB 11.4 03/09/2022  HCT 39.6 03/09/2022   PLT 388 03/09/2022   GLUCOSE 181 (H) 12/08/2021   CHOL 153 12/08/2021   TRIG 131 12/08/2021   HDL 55 12/08/2021   LDLCALC 75 12/08/2021   ALT 18 12/08/2021   AST 16 12/08/2021   NA 139 12/08/2021   K 5.2 12/08/2021   CL 102 12/08/2021   CREATININE 0.95 12/08/2021   BUN 14 12/08/2021   CO2 24 12/08/2021   TSH 0.850 12/08/2021   INR 0.98 09/12/2017   HGBA1C 7.3 (H) 12/08/2021   MICROALBUR 10 03/30/2020      Assessment & Plan:   Problem List Items Addressed This Visit       Endocrine   Type 2 diabetes mellitus with diabetic polyneuropathy, without long-term current use of insulin (HCC)   Relevant Medications   rosuvastatin (CRESTOR) 5 MG tablet    ramipril (ALTACE) 2.5 MG capsule   Other Relevant Orders   CBC with Differential/Platelet   Comprehensive metabolic panel   Lipid panel   Hemoglobin A1c  PT TO BRING ALL MEDICATIONS BY OFFICE TO REVIEW     Other   Mixed hyperlipidemia   Relevant Medications   rosuvastatin (CRESTOR) 5 MG tablet   ramipril (ALTACE) 2.5 MG capsule Continue to watch diet   Other Relevant Orders   Lipid panel   Anxiety Continue current meds   Vitamin D insufficiency   Relevant Orders   VITAMIN D 25 Hydroxy (Vit-D Deficiency, Fractures)   Anemia - Primary   Relevant Orders   CBC with Differential/Platelet   Iron, TIBC and Ferritin Panel   B12 and Folate Panel   Orthostatic dizziness   Relevant Orders   CBC with Differential/Platelet   Comprehensive metabolic panel   TSH   EKG 12-Lead   Exertional dyspnea   Relevant Orders   EKG 12-Lead  .  No orders of the defined types were placed in this encounter.   Orders Placed This Encounter  Procedures   CBC with Differential/Platelet   Comprehensive metabolic panel   TSH   Lipid panel   VITAMIN D 25 Hydroxy (Vit-D Deficiency, Fractures)   Iron, TIBC and Ferritin Panel   Hemoglobin A1c   B12 and Folate Panel   Ambulatory referral to Cardiology   EKG 12-Lead     Follow-up: Return in about 3 months (around 06/18/2022) for fasting follow up.  An After Visit Summary was printed and given to the patient.  Yetta Flock [facility] [phone]

## 2022-03-20 ENCOUNTER — Other Ambulatory Visit: Payer: Self-pay | Admitting: Physician Assistant

## 2022-03-20 DIAGNOSIS — D508 Other iron deficiency anemias: Secondary | ICD-10-CM

## 2022-03-20 LAB — CBC WITH DIFFERENTIAL/PLATELET
Basophils Absolute: 0.1 10*3/uL (ref 0.0–0.2)
Basos: 1 %
EOS (ABSOLUTE): 0.5 10*3/uL — ABNORMAL HIGH (ref 0.0–0.4)
Eos: 5 %
Hematocrit: 40.7 % (ref 34.0–46.6)
Hemoglobin: 12.2 g/dL (ref 11.1–15.9)
Immature Grans (Abs): 0.1 10*3/uL (ref 0.0–0.1)
Immature Granulocytes: 1 %
Lymphocytes Absolute: 2.3 10*3/uL (ref 0.7–3.1)
Lymphs: 22 %
MCH: 22.8 pg — ABNORMAL LOW (ref 26.6–33.0)
MCHC: 30 g/dL — ABNORMAL LOW (ref 31.5–35.7)
MCV: 76 fL — ABNORMAL LOW (ref 79–97)
Monocytes Absolute: 0.7 10*3/uL (ref 0.1–0.9)
Monocytes: 7 %
Neutrophils Absolute: 6.5 10*3/uL (ref 1.4–7.0)
Neutrophils: 64 %
Platelets: 549 10*3/uL — ABNORMAL HIGH (ref 150–450)
RBC: 5.36 x10E6/uL — ABNORMAL HIGH (ref 3.77–5.28)
RDW: 19.5 % — ABNORMAL HIGH (ref 11.7–15.4)
WBC: 10.1 10*3/uL (ref 3.4–10.8)

## 2022-03-20 LAB — TSH: TSH: 1.44 u[IU]/mL (ref 0.450–4.500)

## 2022-03-20 LAB — LIPID PANEL
Chol/HDL Ratio: 2.6 ratio (ref 0.0–4.4)
Cholesterol, Total: 147 mg/dL (ref 100–199)
HDL: 57 mg/dL (ref >39)
LDL Chol Calc (NIH): 58 mg/dL (ref 0–99)
Triglycerides: 199 mg/dL — ABNORMAL HIGH (ref 0–149)
VLDL Cholesterol Cal: 32 mg/dL (ref 5–40)

## 2022-03-20 LAB — HEMOGLOBIN A1C
Est. average glucose Bld gHb Est-mCnc: 189 mg/dL
Hgb A1c MFr Bld: 8.2 % — ABNORMAL HIGH (ref 4.8–5.6)

## 2022-03-20 LAB — COMPREHENSIVE METABOLIC PANEL
ALT: 10 IU/L (ref 0–32)
AST: 14 IU/L (ref 0–40)
Albumin/Globulin Ratio: 1.6 (ref 1.2–2.2)
Albumin: 4.2 g/dL (ref 3.9–4.9)
Alkaline Phosphatase: 97 IU/L (ref 44–121)
BUN/Creatinine Ratio: 10 — ABNORMAL LOW (ref 12–28)
BUN: 12 mg/dL (ref 8–27)
Bilirubin Total: 0.4 mg/dL (ref 0.0–1.2)
CO2: 26 mmol/L (ref 20–29)
Calcium: 9.5 mg/dL (ref 8.7–10.3)
Chloride: 97 mmol/L (ref 96–106)
Creatinine, Ser: 1.15 mg/dL — ABNORMAL HIGH (ref 0.57–1.00)
Globulin, Total: 2.7 g/dL (ref 1.5–4.5)
Glucose: 225 mg/dL — ABNORMAL HIGH (ref 70–99)
Potassium: 5.1 mmol/L (ref 3.5–5.2)
Sodium: 139 mmol/L (ref 134–144)
Total Protein: 6.9 g/dL (ref 6.0–8.5)
eGFR: 52 mL/min/{1.73_m2} — ABNORMAL LOW (ref >59)

## 2022-03-20 LAB — B12 AND FOLATE PANEL
Folate: 14.3 ng/mL (ref >3.0)
Vitamin B-12: 507 pg/mL (ref 232–1245)

## 2022-03-20 LAB — IRON,TIBC AND FERRITIN PANEL
Ferritin: 33 ng/mL (ref 15–150)
Iron Saturation: 10 % — ABNORMAL LOW (ref 15–55)
Iron: 41 ug/dL (ref 27–139)
Total Iron Binding Capacity: 409 ug/dL (ref 250–450)
UIBC: 368 ug/dL (ref 118–369)

## 2022-03-20 LAB — VITAMIN D 25 HYDROXY (VIT D DEFICIENCY, FRACTURES): Vit D, 25-Hydroxy: 49 ng/mL (ref 30.0–100.0)

## 2022-03-20 LAB — CARDIOVASCULAR RISK ASSESSMENT

## 2022-03-22 ENCOUNTER — Encounter: Payer: Self-pay | Admitting: Nurse Practitioner

## 2022-03-22 ENCOUNTER — Ambulatory Visit: Payer: PPO | Admitting: Nurse Practitioner

## 2022-03-22 VITALS — BP 148/72 | HR 114 | Ht 63.0 in | Wt 221.0 lb

## 2022-03-22 DIAGNOSIS — D509 Iron deficiency anemia, unspecified: Secondary | ICD-10-CM

## 2022-03-22 NOTE — Progress Notes (Signed)
Chief Complaint:  follow up on anemia   Assessment &  Plan   #71 year old female with iron deficiency anemia, FOBT + stool in setting of NSAIDs.   EGD with small bowel biopsies and colonoscopy unrevealing.  Her hemoglobin normalized on oral iron. Mild improvement in ferritin but still only in the 30s.  She stopped taking the 800 mg of ibuprofen twice daily (took for a year or so) PCP is monitoring her CBC/iron stores.  If ferritin does not continue to improve and / or if hemoglobin declines then we may need to proceed with further work-up to include evaluation of the small bowel   #GERD without Barrett's esophagus.  Asymptomatic on daily omeprazole  #History of colon polyps.  2 small tubular adenomas removed August 2023.  7-year surveillance colonoscopy recommended.   # Dizziness, SHOB with exertion. To see Cardiology soon. Wouldn't think it is anemia related since she is still symptomatic with now normal hgb  HPI   Madison Stein is a 71 y.o. female known to Madison Stein with a past medical history significant for IDA, adenomatous colon polyps,  DM 2, obesity, hyperlipidemia. See PMH /PSH for additional history   Madison Stein was seen here the 02/06/2022 for evaluation of Hemoccult positive stool/anemia.  Her hemoglobin was 10,  down from baseline of 12-13.  Ferritin was 13  She subsequently underwent an EGD and colonoscopy by Madison Stein.  See reports below but essentially EGD with gastric and duodenal biopsies was unremarkable.  Two small tubular adenomas were removed from her colon  Interval History:  Stools are dark on iron, no overt bleeding.  Taking one iron tablet a day without significant constipation.  Most recent labs on 9/11 showed that her hemoglobin has improved to 12.2 but ferritin only 33.   Plan per Madison Juneau, PA-C is to continue oral iron and repeat CBC and iron studies in about a month.   Madison Stein complains of dizziness when changing positions.  No orthopnea but she does  have shortness of breath with exertion.  She tells me her heart rate has been high and that she is to be evaluated by Cardiology.    Previous GI Evaluation   02/12/2022 EGD for IDA, FOBT+ Erythema in the gastric body, biopsied.  Normal duodenum which was biopsied.  Exam otherwise normal  02/12/2022 colonoscopy for iron deficiency anemia -Internal and external hemorrhoids. Two sessile polyps ranging in 2 to 3 mm in size were removed exam otherwise normal  Surgical [P], duodenal - DUODENAL MUCOSA WITH NORMAL VILLOUS ARCHITECTURE. - NO VILLOUS ATROPHY OR INCREASED INTRAEPITHELIAL LYMPHOCYTES. 2. Surgical [P], gastric - ANTRAL AND OXYNTIC MUCOSA WITH NO SIGNIFICANT PATHOLOGIC CHANGES. - NO HELICOBACTER PYLORI IDENTIFIED. 3. Surgical [P], colon, ascending, polyp (2) - TUBULAR ADENOMA (2) WITHOUT HIGH GRADE DYSPL   Labs:     Latest Ref Rng & Units 03/19/2022    9:53 AM 03/09/2022   12:02 PM 02/21/2022    2:43 PM  CBC  WBC 3.4 - 10.8 x10E3/uL 10.1  9.2  8.6   Hemoglobin 11.1 - 15.9 g/dL 97.4  91.5  19.0   Hematocrit 34.0 - 46.6 % 40.7  39.6  34.8   Platelets 150 - 450 x10E3/uL 549  388  350        Latest Ref Rng & Units 03/19/2022    9:53 AM 12/08/2021   10:40 AM 08/29/2021   12:11 PM  Hepatic Function  Total Protein 6.0 - 8.5 g/dL 6.9  6.6  6.8  Albumin 3.9 - 4.9 g/dL 4.2  4.3  4.1   AST 0 - 40 IU/L $Remov'14  16  16   'jkSHMY$ ALT 0 - 32 IU/L $Remov'10  18  16   'PZwsVK$ Alk Phosphatase 44 - 121 IU/L 97  82  108   Total Bilirubin 0.0 - 1.2 mg/dL 0.4  0.3  0.3      Past Medical History:  Diagnosis Date   Anemia    Anxiety    Arthritis    Bronchitis 7412   Hx of   Complication of anesthesia    Depression    takes Cymbalta daily   Depression    Depression with anxiety    Diabetes mellitus without complication (HCC)    takes Metformin daily   Diabetes mellitus without complication (HCC)    Type II   Family history of adverse reaction to anesthesia    patients mother and sister gets sick   Family  history of anesthesia complication    mom and sister get very sick   Fibromyalgia    History of bronchitis 55yrs ago   History of shingles    Hyperlipidemia    takes Fish Oil bid   Hyperlipidemia    Insomnia    has Ambien if needed   Joint pain    Nontoxic (diffuse) goiter    Osteoporosis    Peripheral neuropathy    takes Lyrica daily   Pneumonia 32yrs ago   hx of   Pneumonia    hx of   Polymyalgia rheumatica (HCC)    PONV (postoperative nausea and vomiting)    Pure hyperglyceridemia    Thyroid nodule    Type 2 diabetes mellitus with diabetic polyneuropathy (HCC)    Urgency of urination    Leakage wears pad    Past Surgical History:  Procedure Laterality Date   BACK SURGERY  1989   lumbar disc   CARPAL TUNNEL RELEASE Right 1985   CATARACT EXTRACTION  2020   CERVICAL FUSION  2009   CHOLECYSTECTOMY  2007   COLONOSCOPY     ESOPHAGOGASTRODUODENOSCOPY     EYE SURGERY Bilateral 2007   laser eye surgery    HAMMER TOE SURGERY  2019   HIP ARTHROPLASTY Bilateral    REFRACTIVE SURGERY  2008   TOTAL HIP ARTHROPLASTY Right 11/20/2013   Procedure: RIGHT TOTAL HIP ARTHROPLASTY;  Surgeon: Kerin Salen, MD;  Location: [place];  Service: Orthopedics;  Laterality: Right;   TOTAL HIP ARTHROPLASTY Left 08/30/2014   Procedure: TOTAL HIP ARTHROPLASTY;  Surgeon: Kerin Salen, MD;  Location: [place];  Service: Orthopedics;  Laterality: Left;   TOTAL KNEE ARTHROPLASTY Left 06/06/2015   Procedure: TOTAL KNEE ARTHROPLASTY;  Surgeon: Frederik Pear, MD;  Location: [place];  Service: Orthopedics;  Laterality: Left;   TOTAL KNEE ARTHROPLASTY Right 09/23/2017   Procedure: TOTAL KNEE ARTHROPLASTY;  Surgeon: Frederik Pear, MD;  Location: [place];  Service: Orthopedics;  Laterality: Right;   TUBAL LIGATION  1987   TUMOR REMOVAL Right    Hand   tumor removed from right hand  2011   ulnar nerve release Left 1996    Current Medications, Allergies, Family History and Social History were reviewed in [employer] record.     Current Outpatient Medications  Medication Sig Dispense Refill   acetaminophen (TYLENOL) 650 MG CR tablet Take 650-1,300 mg by mouth 2 (two) times daily as needed for pain.     albuterol (VENTOLIN HFA) 108 (90 Base) MCG/ACT  inhaler INHALE 2 PUFFS INTO THE LUNGS EVERY 6 HOURS AS NEEDED FOR WHEEZING OR SHORTNESS OF BREATH 8.5 g 1   aspirin EC 81 MG tablet Take 1 tablet (81 mg total) by mouth daily. 30 tablet 0   buPROPion (WELLBUTRIN XL) 300 MG 24 hr tablet TAKE 1 TABLET(300 MG) BY MOUTH DAILY 90 tablet 1   DULoxetine (CYMBALTA) 60 MG capsule TAKE ONE CAPSULE BY MOUTH TWICE DAILY 180 capsule 0   FEROSUL 325 (65 Fe) MG tablet Take 1 tablet (325 mg total) by mouth daily. 90 tablet 0   FREESTYLE LITE test strip CHECK BLOOD SUGAR EVERY DAY 100 strip 1   gabapentin (NEURONTIN) 300 MG capsule TAKE 1 CAPSULE BY MOUTH EVERY MORNING THEN TAKE 2 CAPSULES BY MOUTH EVERY NIGHT AT BEDTIME 90 capsule 1   glimepiride (AMARYL) 4 MG tablet TAKE 1 TABLET BY MOUTH EVERY DAY 90 tablet 0   icosapent Ethyl (VASCEPA) 1 g capsule TAKE 2 CAPSULES BY MOUTH TWICE DAILY WITH FOOD 120 capsule 3   JANUMET XR [phone] MG TB24 TAKE 1 TABLET BY MOUTH DAILY WITH SUPPER 90 tablet 0   methocarbamol (ROBAXIN) 500 MG tablet TAKE 1 TABLET(500 MG) BY MOUTH DAILY AS NEEDED FOR MUSCLE SPASMS 30 tablet 3   Multiple Vitamin (MULTIVITAMIN WITH MINERALS) TABS tablet Take 1 tablet by mouth daily.     omeprazole (PRILOSEC) 40 MG capsule TAKE 1 CAPSULE(40 MG) BY MOUTH DAILY 90 capsule 0   ramipril (ALTACE) 2.5 MG capsule Take 2.5 mg by mouth daily.     rizatriptan (MAXALT-MLT) 10 MG disintegrating tablet DISSOLVE 1 TABLET ON TONGUE, MAY REPEAT AT 2 HOUR INTERVAL IF NEEDED, MAX OF 3 TABLETS IN 24 HOURS 18 tablet 2   rosuvastatin (CRESTOR) 5 MG tablet Take 5 mg by mouth daily.     Vitamin D, Ergocalciferol, (DRISDOL) 1.25 MG (50000 UNIT) CAPS capsule TAKE 1 CAPSULE BY MOUTH EVERY 7 DAYS 5 capsule 5   No  current facility-administered medications for this visit.    Review of Systems: Positive for dizziness. Positive for [facility] with exertional No urinary complaints.    Physical Exam  Wt Readings from Last 3 Encounters:  03/19/22 216 lb (98 kg)  02/12/22 220 lb (99.8 kg)  02/07/22 219 lb 9.6 oz (99.6 kg)    BP (!) 148/72   Pulse (!) 114   Ht $R'5\' 3"'tn$  (1.6 m)   Wt 221 lb (100.2 kg)   BMI 39.15 kg/m  Constitutional:  Generally well appearing female in no acute distress. Psychiatric: Pleasant. Normal mood and affect. Behavior is normal. EENT: Pupils normal.  Conjunctivae are normal. No scleral icterus. Neck supple.  Cardiovascular: Normal rate, regular rhythm. No edema Pulmonary/chest: Effort normal and breath sounds normal.  Occasional expiratory wheeze Abdominal: Soft, nondistended, nontender. Bowel sounds active throughout. There are no masses palpable. No hepatomegaly. Neurological: Alert and oriented to person place and time. Skin: Skin is warm and dry. No rashes noted.  [name], NP  03/22/2022, 8:19 AM  I spent 30 minutes total reviewing records, obtaining history, performing exam, counseling patient and documenting visit / findings.   Cc:  Marge Duncans, PA-C

## 2022-03-22 NOTE — Patient Instructions (Signed)
Will call with further recommendations after speaking with Dr. Tarri Glenn.   _______________________________________________________  If you are age 69 or older, your body mass index should be between 23-30. Your Body mass index is 39.15 kg/m. If this is out of the aforementioned range listed, please consider follow up with your Primary Care Provider.  If you are age 47 or younger, your body mass index should be between 19-25. Your Body mass index is 39.15 kg/m. If this is out of the aformentioned range listed, please consider follow up with your Primary Care Provider.   ________________________________________________________  The [place] [facility] providers would like to encourage you to use [facility] to communicate with providers for non-urgent requests or questions.  Due to long hold times on the telephone, sending your provider a message by [facility] may be a faster and more efficient way to get a response.  Please allow 48 business hours for a response.  Please remember that this is for non-urgent requests.  _______________________________________________________

## 2022-03-25 NOTE — Progress Notes (Signed)
Reviewed and agree with management plans. ? ?Nalanie Winiecki L. Blakeley Margraf, MD, MPH  ?

## 2022-03-28 ENCOUNTER — Other Ambulatory Visit: Payer: Self-pay

## 2022-03-28 DIAGNOSIS — E1142 Type 2 diabetes mellitus with diabetic polyneuropathy: Secondary | ICD-10-CM

## 2022-03-28 MED ORDER — JANUMET XR 100-1000 MG PO TB24: 1.0000 | ORAL_TABLET | Freq: Every day | ORAL | 2 refills | Status: AC

## 2022-04-01 ENCOUNTER — Other Ambulatory Visit: Payer: Self-pay | Admitting: Physician Assistant

## 2022-04-04 ENCOUNTER — Encounter: Payer: Self-pay | Admitting: Cardiology

## 2022-04-04 ENCOUNTER — Ambulatory Visit: Payer: PPO | Attending: Cardiology | Admitting: Cardiology

## 2022-04-04 ENCOUNTER — Ambulatory Visit: Payer: PPO | Attending: Cardiology

## 2022-04-04 VITALS — BP 132/68 | HR 104 | Ht 63.0 in | Wt 219.2 lb

## 2022-04-04 DIAGNOSIS — R0609 Other forms of dyspnea: Secondary | ICD-10-CM

## 2022-04-04 DIAGNOSIS — I951 Orthostatic hypotension: Secondary | ICD-10-CM

## 2022-04-04 DIAGNOSIS — I10 Essential (primary) hypertension: Secondary | ICD-10-CM

## 2022-04-04 DIAGNOSIS — E1142 Type 2 diabetes mellitus with diabetic polyneuropathy: Secondary | ICD-10-CM | POA: Diagnosis not present

## 2022-04-04 DIAGNOSIS — R000 Tachycardia, unspecified: Secondary | ICD-10-CM

## 2022-04-04 NOTE — Patient Instructions (Signed)
Medication Instructions:  Your physician recommends that you continue on your current medications as directed. Please refer to the Current Medication list given to you today.  *If you need a refill on your cardiac medications before your next appointment, please call your pharmacy*   Lab Work: None If you have labs (blood work) drawn today and your tests are completely normal, you will receive your results only by: [place] (if you have MyChart) OR A paper copy in the mail If you have any lab test that is abnormal or we need to change your treatment, we will call you to review the results.   Testing/Procedures: A zio monitor was ordered today. It will remain on for 14 days. You will then return monitor and event diary in provided box. It takes 1-2 weeks for report to be downloaded and returned to [place]. We will call you with the results. If monitor falls off or has orange flashing light, please call [employer] for further instructions.   Your physician has requested that you have an echocardiogram. Echocardiography is a painless test that uses sound waves to create images of your heart. It provides your doctor with information about the size and shape of your heart and how well your heart's chambers and valves are working. This procedure takes approximately one hour. There are no restrictions for this procedure.    Follow-Up: At [facility], you and your health needs are our priority.  As part of our continuing mission to provide you with exceptional heart care, we have created designated Provider Care Teams.  These Care Teams include your primary Cardiologist (physician) and Advanced Practice Providers (APPs -  Physician Assistants and Nurse Practitioners) who all work together to provide you with the care you need, when you need it.  We recommend signing up for the patient portal called "MyChart".  Sign up information is provided on this After Visit Summary.  MyChart is used to  connect with patients for Virtual Visits (Telemedicine).  Patients are able to view lab/test results, encounter notes, upcoming appointments, etc.  Non-urgent messages can be sent to your provider as well.   To learn more about what you can do with MyChart, go to [email or web address].    Your next appointment:   2 month(s)  The format for your next appointment:   In Person  Provider:   Jenne Campus, MD    Other Instructions None  Important Information About Sugar

## 2022-04-04 NOTE — Progress Notes (Signed)
Cardiology Consultation:    Date:  04/04/2022   ID:  Madison Stein, Madison Stein July 19, 1950, MRN [id number]  PCP:  Marge Duncans, PA-C  Cardiologist:  Jenne Campus, MD   Referring MD: Marge Duncans, PA-C   Chief Complaint  Patient presents with   Dizziness   Anemia   Tachycardia     Ongoing since July 2023    History of Present Illness:    Madison Stein is a 71 y.o. female who is being seen today for the evaluation of orthostatic hypotension at the request of Marge Duncans, Hershal Coria.  Past medical history significant for essential hypertension, orthostatic hypotension, diabetes for about 10 years, dyslipidemia.  Few months ago she started experiencing dizziness upon getting up she was find to have anemia quite extensive work-up has been done including gastroscopy as well as colonoscopy and apparently work-up was unrevealing.  She was put on iron and now blood count is much better and her symptoms improved quite significantly she also describes fatigue tiredness and shortness of breath on exertion as well as profound sweating.  She is diabetic and her diabetes appears to be poorly controlled.  She never had any heart trouble.  She denies have any typical tightness squeezing pressure burning chest but shortness of breath fatigue and tiredness is concerning.  Past Medical History:  Diagnosis Date   Anemia    Anxiety    Arthritis    Bronchitis 8338   Hx of   Complication of anesthesia    Depression    takes Cymbalta daily   Depression    Depression with anxiety    Diabetes mellitus without complication ([place])    takes Metformin daily   Diabetes mellitus without complication ([place])    Type II   Family history of adverse reaction to anesthesia    patients mother and sister gets sick   Family history of anesthesia complication    mom and sister get very sick   Fibromyalgia    History of bronchitis 37yr ago   History of shingles    Hyperlipidemia    takes Fish Oil bid   Hyperlipidemia     Insomnia    has Ambien if needed   Joint pain    Nontoxic (diffuse) goiter    Osteoporosis    Peripheral neuropathy    takes Lyrica daily   Pneumonia 476yrago   hx of   Pneumonia    hx of   Polymyalgia rheumatica (HCC)    PONV (postoperative nausea and vomiting)    Pure hyperglyceridemia    Thyroid nodule    Type 2 diabetes mellitus with diabetic polyneuropathy (HCC)    Urgency of urination    Leakage wears pad    Past Surgical History:  Procedure Laterality Date   BACK SURGERY  1989   lumbar disc   CARPAL TUNNEL RELEASE Right 1985   CATARACT EXTRACTION  2020   CERVICAL FUSION  2009   CHOLECYSTECTOMY  2007   COLONOSCOPY     ESOPHAGOGASTRODUODENOSCOPY     EYE SURGERY Bilateral 2007   laser eye surgery    HAMMER TOE SURGERY  2019   HIP ARTHROPLASTY Bilateral    REFRACTIVE SURGERY  2008   TOTAL HIP ARTHROPLASTY Right 11/20/2013   Procedure: RIGHT TOTAL HIP ARTHROPLASTY;  Surgeon: FrKerin SalenMD;  [facility];  Laterality: Right;   TOTAL HIP ARTHROPLASTY Left 08/30/2014   Procedure: TOTAL HIP ARTHROPLASTY;  Surgeon: FrKerin SalenMD;  Location: [place];  Service: Orthopedics;  Laterality: Left;   TOTAL KNEE ARTHROPLASTY Left 06/06/2015   Procedure: TOTAL KNEE ARTHROPLASTY;  Surgeon: Frederik Pear, MD;  Location: [place];  Service: Orthopedics;  Laterality: Left;   TOTAL KNEE ARTHROPLASTY Right 09/23/2017   Procedure: TOTAL KNEE ARTHROPLASTY;  Surgeon: Frederik Pear, MD;  Location: [place];  Service: Orthopedics;  Laterality: Right;   TUBAL LIGATION  1987   TUMOR REMOVAL Right    Hand   tumor removed from right hand  2011   ulnar nerve release Left 1996    Current Medications: Current Meds  Medication Sig   acetaminophen (TYLENOL) 650 MG CR tablet Take 650-1,300 mg by mouth 2 (two) times daily as needed for pain.   albuterol (VENTOLIN HFA) 108 (90 Base) MCG/ACT inhaler INHALE 2 PUFFS INTO THE LUNGS EVERY 6 HOURS AS NEEDED FOR WHEEZING OR SHORTNESS  OF BREATH (Patient taking differently: Inhale 2 puffs into the lungs every 6 (six) hours as needed for wheezing or shortness of breath.)   aspirin EC 81 MG tablet Take 1 tablet (81 mg total) by mouth daily.   buPROPion (WELLBUTRIN XL) 300 MG 24 hr tablet TAKE 1 TABLET(300 MG) BY MOUTH DAILY (Patient taking differently: Take 300 mg by mouth daily. TAKE 1 TABLET(300 MG) BY MOUTH DAILY)   DULoxetine (CYMBALTA) 60 MG capsule TAKE ONE CAPSULE BY MOUTH TWICE DAILY (Patient taking differently: Take 60 mg by mouth 2 (two) times daily. TAKE ONE CAPSULE BY MOUTH TWICE DAILY)   FEROSUL 325 (65 Fe) MG tablet Take 1 tablet (325 mg total) by mouth daily.   FREESTYLE LITE test strip CHECK BLOOD SUGAR EVERY DAY (Patient taking differently: 1 each by Other route as needed for other.)   gabapentin (NEURONTIN) 300 MG capsule TAKE 1 CAPSULE BY MOUTH EVERY MORNING THEN TAKE 2 CAPSULES BY MOUTH EVERY NIGHT AT BEDTIME (Patient taking differently: Take 300-600 mg by mouth at bedtime. TAKE 1 CAPSULE BY MOUTH EVERY MORNING THEN TAKE 2 CAPSULES BY MOUTH EVERY NIGHT AT BEDTIME)   glimepiride (AMARYL) 4 MG tablet TAKE 1 TABLET BY MOUTH EVERY DAY   icosapent Ethyl (VASCEPA) 1 g capsule TAKE 2 CAPSULES BY MOUTH TWICE DAILY WITH FOOD (Patient taking differently: Take 2 g by mouth 2 (two) times daily. TAKE 2 CAPSULES BY MOUTH TWICE DAILY WITH FOOD)   methocarbamol (ROBAXIN) 500 MG tablet TAKE 1 TABLET(500 MG) BY MOUTH DAILY AS NEEDED FOR MUSCLE SPASMS (Patient taking differently: Take 500 mg by mouth as needed for muscle spasms.)   Multiple Vitamin (MULTIVITAMIN WITH MINERALS) TABS tablet Take 1 tablet by mouth daily.   omeprazole (PRILOSEC) 40 MG capsule TAKE 1 CAPSULE(40 MG) BY MOUTH DAILY (Patient taking differently: Take 40 mg by mouth daily. TAKE 1 CAPSULE(40 MG) BY MOUTH DAILY)   ramipril (ALTACE) 2.5 MG capsule Take 2.5 mg by mouth daily.   rizatriptan (MAXALT-MLT) 10 MG disintegrating tablet DISSOLVE 1 TABLET ON TONGUE, MAY  REPEAT AT 2 HOUR INTERVAL IF NEEDED, MAX OF 3 TABLETS IN 24 HOURS (Patient taking differently: Take 10 mg by mouth as needed for migraine. DISSOLVE 1 TABLET ON TONGUE, MAY REPEAT AT 2 HOUR INTERVAL IF NEEDED, MAX OF 3 TABLETS IN 24 HOURS)   rosuvastatin (CRESTOR) 5 MG tablet Take 5 mg by mouth daily.   SitaGLIPtin-MetFORMIN HCl (JANUMET XR) [phone] MG TB24 Take 1 tablet by mouth daily with supper.   Vitamin D, Ergocalciferol, (DRISDOL) 1.25 MG (50000 UNIT) CAPS capsule TAKE 1 CAPSULE BY MOUTH EVERY  7 DAYS (Patient taking differently: Take 50,000 Units by mouth every 7 (seven) days.)     Allergies:   Morphine and related and Zestril [lisinopril]   Social History   Socioeconomic History   Marital status: Divorced    Spouse name: Not on file   Number of children: 1   Years of education: Not on file   Highest education level: Some college, no degree  Occupational History   Occupation: Retired  Tobacco Use   Smoking status: Former   Smokeless tobacco: Never   Tobacco comments:    quit smoking in 1997  Vaping Use   Vaping Use: Never used  Substance and Sexual Activity   Alcohol use: Not Currently    Comment: rare beer   Drug use: No   Sexual activity: Never    Birth control/protection: Post-menopausal  Other Topics Concern   Not on file  Social History Narrative   Lives alone   Left handed   Caffeine: at the most 2 cups of coffee/day      ** Merged History Encounter **       Social Determinants of Health   Financial Resource Strain: Not on file  Food Insecurity: Not on file  Transportation Needs: Not on file  Physical Activity: Not on file  Stress: Not on file  Social Connections: Not on file     Family History: The patient's family history includes Alzheimer's disease in her mother; Asthma in her daughter; Diabetes in her father; Fibromyalgia in her mother; Healthy in her daughter; High Cholesterol in her father; Kidney failure in her father. There is no history of  Colon cancer, Stomach cancer, Esophageal cancer, or Colon polyps. ROS:   Please see the history of present illness.    All 14 point review of systems negative except as described per history of present illness.  EKGs/Labs/Other Studies Reviewed:    The following studies were reviewed today:   EKG:  EKG is  ordered today.  The ekg ordered today demonstrates sinus tachycardia, rate 105, normal P interval normal QS complex duration morphology nonspecific ST segment changes  Recent Labs: 03/19/2022: ALT 10; BUN 12; Creatinine, Ser 1.15; Hemoglobin 12.2; Platelets 549; Potassium 5.1; Sodium 139; TSH 1.440  Recent Lipid Panel    Component Value Date/Time   CHOL 147 03/19/2022 0953   TRIG 199 (H) 03/19/2022 0953   HDL 57 03/19/2022 0953   CHOLHDL 2.6 03/19/2022 0953   LDLCALC 58 03/19/2022 0953    Physical Exam:    VS:  BP 132/68 (BP Location: Left Arm, Patient Position: Sitting)   Pulse (!) 104   Ht '5\' 3"'$  (1.6 m)   Wt 219 lb 3.2 oz (99.4 kg)   SpO2 95%   BMI 38.83 kg/m     Wt Readings from Last 3 Encounters:  04/04/22 219 lb 3.2 oz (99.4 kg)  03/22/22 221 lb (100.2 kg)  03/19/22 216 lb (98 kg)     GEN:  Well nourished, well developed in no acute distress HEENT: Normal NECK: No JVD; No carotid bruits LYMPHATICS: No lymphadenopathy CARDIAC: RRR, no murmurs, no rubs, no gallops RESPIRATORY:  Clear to auscultation without rales, wheezing or rhonchi  ABDOMEN: Soft, non-tender, non-distended MUSCULOSKELETAL:  No edema; No deformity  SKIN: Warm and dry NEUROLOGIC:  Alert and oriented x 3 PSYCHIATRIC:  Normal affect   ASSESSMENT:    1. Type 2 diabetes mellitus with diabetic polyneuropathy, without long-term current use of insulin ([place])   2. Orthostatic hypotension  3. Essential hypertension   4. Dyspnea on exertion    PLAN:    In order of problems listed above:  Orthostatic hypotension.  This is much better right now after the anemia has being improved.  I encouraged  her to be well-hydrated and be careful upon getting up.  I will schedule her to have echocardiogram to assess left ventricle ejection fraction make sure that her left ventricle ejection fraction is preserved and is not contributing to her to her symptomatology. She described also to have some palpitations she said few times a week she will feel her heart speeding up with no particular reason that last for few minutes and goes back to normal.  I will ask her to wear Zio patch done to find out if she had any significant arrhythmia. Possibility of coronary artery disease that need to be investigated as well I think the best test for her will be coronary CT angio, however, I will wait for results of the echocardiogram before committing her to the staff. Essential hypertension blood pressure seems to well controlled today in the office.  We will continue present management. Dyslipidemia I did review her K PN which show me data from 11th September 2023 with LDL of 58 HDL 57.  We will continue present management which include Crestor 5.   Medication Adjustments/Labs and Tests Ordered: Current medicines are reviewed at length with the patient today.  Concerns regarding medicines are outlined above.  Orders Placed This Encounter  Procedures   LONG TERM MONITOR (3-14 DAYS)   EKG 12-Lead   ECHOCARDIOGRAM COMPLETE   No orders of the defined types were placed in this encounter.   Signed, Park Liter, MD, [facility]. 04/04/2022 4:17 PM    [facility]

## 2022-04-05 ENCOUNTER — Other Ambulatory Visit: Payer: Self-pay | Admitting: Physician Assistant

## 2022-04-05 ENCOUNTER — Telehealth: Payer: Self-pay | Admitting: Cardiology

## 2022-04-05 NOTE — Telephone Encounter (Signed)
Patient states her heart monitor came off and the company is sending her a new one. She would like to come in and have someone put the monitor on.

## 2022-04-05 NOTE — Telephone Encounter (Signed)
Advised to call when the monitor arrives and we will make a monitor visit. Pt verbalized understanding and had no additional questions.

## 2022-04-09 ENCOUNTER — Telehealth: Payer: Self-pay | Admitting: Cardiology

## 2022-04-09 NOTE — Telephone Encounter (Signed)
Patient calling back to schedule when she can come in to have the heart monitor put on. She says she can do anything after 11:00 am today.

## 2022-04-09 NOTE — Telephone Encounter (Signed)
Spoke with pt advised that someone would call her in the morning to schedule the monitor appt. Pt verbalized understanding and had no further questions.

## 2022-04-09 NOTE — Telephone Encounter (Signed)
Patient called to check on status of her call, as she has no heard back yet.

## 2022-04-10 ENCOUNTER — Telehealth: Payer: Self-pay

## 2022-04-10 ENCOUNTER — Other Ambulatory Visit: Payer: Self-pay

## 2022-04-10 ENCOUNTER — Ambulatory Visit: Payer: PPO

## 2022-04-10 DIAGNOSIS — R000 Tachycardia, unspecified: Secondary | ICD-10-CM

## 2022-04-10 DIAGNOSIS — D509 Iron deficiency anemia, unspecified: Secondary | ICD-10-CM

## 2022-04-10 NOTE — Telephone Encounter (Signed)
Called patient and confirmed that [employer] had sent her another heart monitor and what time she would be coming into the office. Patient stated that she would come into the office at 2 pm to have the new zio heart monitor applied. Patient had no further questions at this time.

## 2022-04-10 NOTE — Telephone Encounter (Signed)
Spoke with pt and let her know about lab work that is due. Pt verbalized understanding. Lab orders entered.

## 2022-04-10 NOTE — Telephone Encounter (Signed)
-----   Message from Marice Potter, RN sent at 02/08/2022  1:34 PM EDT ----- Pt needs another CBC and IBC+ferritin in two months (around 04/10/22). Need to place order.

## 2022-04-11 ENCOUNTER — Telehealth: Payer: Self-pay

## 2022-04-11 NOTE — Telephone Encounter (Signed)
Patient came in to the office today stating that her zio heart monitor that she had been wearing started having lights flashing red and yellow and they would not stop. She called the [employer] and they sent her another monitor and she came by the office today to have the new monitor put on. The monitor the [place] mailed her was put on the patient and the instructions were reviewed with the patient. Patient had no further questions at this time.

## 2022-04-13 ENCOUNTER — Other Ambulatory Visit: Payer: Self-pay | Admitting: Physician Assistant

## 2022-04-13 DIAGNOSIS — E782 Mixed hyperlipidemia: Secondary | ICD-10-CM

## 2022-04-16 ENCOUNTER — Other Ambulatory Visit (INDEPENDENT_AMBULATORY_CARE_PROVIDER_SITE_OTHER): Payer: PPO

## 2022-04-16 DIAGNOSIS — D509 Iron deficiency anemia, unspecified: Secondary | ICD-10-CM

## 2022-04-16 LAB — CBC WITH DIFFERENTIAL/PLATELET
Basophils Absolute: 0.1 10*3/uL (ref 0.0–0.1)
Basophils Relative: 1 % (ref 0.0–3.0)
Eosinophils Absolute: 0.6 10*3/uL (ref 0.0–0.7)
Eosinophils Relative: 5.2 % — ABNORMAL HIGH (ref 0.0–5.0)
HCT: 38.5 % (ref 36.0–46.0)
Hemoglobin: 12.1 g/dL (ref 12.0–15.0)
Lymphocytes Relative: 19.1 % (ref 12.0–46.0)
Lymphs Abs: 2.1 10*3/uL (ref 0.7–4.0)
MCHC: 31.5 g/dL (ref 30.0–36.0)
MCV: 79 fl (ref 78.0–100.0)
Monocytes Absolute: 0.7 10*3/uL (ref 0.1–1.0)
Monocytes Relative: 6.5 % (ref 3.0–12.0)
Neutro Abs: 7.3 10*3/uL (ref 1.4–7.7)
Neutrophils Relative %: 68.2 % (ref 43.0–77.0)
Platelets: 360 10*3/uL (ref 150.0–400.0)
RBC: 4.87 Mil/uL (ref 3.87–5.11)
RDW: 21.2 % — ABNORMAL HIGH (ref 11.5–15.5)
WBC: 10.8 10*3/uL — ABNORMAL HIGH (ref 4.0–10.5)

## 2022-04-16 LAB — IBC + FERRITIN
Ferritin: 15.8 ng/mL (ref 10.0–291.0)
Iron: 45 ug/dL (ref 42–145)
Saturation Ratios: 11.6 % — ABNORMAL LOW (ref 20.0–50.0)
TIBC: 386.4 ug/dL (ref 250.0–450.0)
Transferrin: 276 mg/dL (ref 212.0–360.0)

## 2022-04-18 ENCOUNTER — Ambulatory Visit: Payer: PPO | Attending: Cardiology

## 2022-04-18 ENCOUNTER — Other Ambulatory Visit: Payer: Self-pay

## 2022-04-18 DIAGNOSIS — I10 Essential (primary) hypertension: Secondary | ICD-10-CM

## 2022-04-18 DIAGNOSIS — R0609 Other forms of dyspnea: Secondary | ICD-10-CM | POA: Diagnosis not present

## 2022-04-18 DIAGNOSIS — I951 Orthostatic hypotension: Secondary | ICD-10-CM | POA: Diagnosis not present

## 2022-04-18 DIAGNOSIS — E1142 Type 2 diabetes mellitus with diabetic polyneuropathy: Secondary | ICD-10-CM | POA: Diagnosis not present

## 2022-04-18 DIAGNOSIS — D509 Iron deficiency anemia, unspecified: Secondary | ICD-10-CM

## 2022-04-18 LAB — ECHOCARDIOGRAM COMPLETE
Area-P 1/2: 8.92 cm2
S' Lateral: 3.9 cm

## 2022-04-29 ENCOUNTER — Other Ambulatory Visit: Payer: Self-pay | Admitting: Physician Assistant

## 2022-05-01 ENCOUNTER — Other Ambulatory Visit: Payer: Self-pay | Admitting: Physician Assistant

## 2022-05-01 DIAGNOSIS — F419 Anxiety disorder, unspecified: Secondary | ICD-10-CM

## 2022-05-01 DIAGNOSIS — J208 Acute bronchitis due to other specified organisms: Secondary | ICD-10-CM

## 2022-05-01 MED ORDER — OMEPRAZOLE 40 MG PO CPDR: 40.0000 mg | DELAYED_RELEASE_CAPSULE | Freq: Every day | ORAL | 1 refills | Status: DC

## 2022-05-03 ENCOUNTER — Other Ambulatory Visit: Payer: Self-pay | Admitting: Cardiology

## 2022-05-03 DIAGNOSIS — I10 Essential (primary) hypertension: Secondary | ICD-10-CM

## 2022-05-03 DIAGNOSIS — R0609 Other forms of dyspnea: Secondary | ICD-10-CM

## 2022-05-03 DIAGNOSIS — R000 Tachycardia, unspecified: Secondary | ICD-10-CM

## 2022-05-03 DIAGNOSIS — E1142 Type 2 diabetes mellitus with diabetic polyneuropathy: Secondary | ICD-10-CM

## 2022-05-03 DIAGNOSIS — I951 Orthostatic hypotension: Secondary | ICD-10-CM

## 2022-05-14 ENCOUNTER — Encounter: Payer: Self-pay | Admitting: Physician Assistant

## 2022-05-14 ENCOUNTER — Ambulatory Visit (INDEPENDENT_AMBULATORY_CARE_PROVIDER_SITE_OTHER): Payer: PPO | Admitting: Physician Assistant

## 2022-05-14 VITALS — BP 130/82 | HR 102 | Temp 97.5°F | Ht 63.0 in | Wt 216.0 lb

## 2022-05-14 DIAGNOSIS — J069 Acute upper respiratory infection, unspecified: Secondary | ICD-10-CM | POA: Diagnosis not present

## 2022-05-14 DIAGNOSIS — J40 Bronchitis, not specified as acute or chronic: Secondary | ICD-10-CM | POA: Diagnosis not present

## 2022-05-14 DIAGNOSIS — J9801 Acute bronchospasm: Secondary | ICD-10-CM

## 2022-05-14 LAB — POC COVID19 BINAXNOW: SARS Coronavirus 2 Ag: NEGATIVE

## 2022-05-14 MED ORDER — PREDNISONE 20 MG PO TABS: ORAL_TABLET | ORAL | 0 refills | Status: AC

## 2022-05-14 MED ORDER — BUDESONIDE-FORMOTEROL FUMARATE 160-4.5 MCG/ACT IN AERO: 2.0000 | INHALATION_SPRAY | Freq: Two times a day (BID) | RESPIRATORY_TRACT | 3 refills | Status: AC

## 2022-05-14 MED ORDER — AMOXICILLIN-POT CLAVULANATE 875-125 MG PO TABS: 1.0000 | ORAL_TABLET | Freq: Two times a day (BID) | ORAL | 0 refills | Status: DC

## 2022-05-14 MED ORDER — HYDROCODONE BIT-HOMATROP MBR 5-1.5 MG/5ML PO SOLN: 5.0000 mL | Freq: Four times a day (QID) | ORAL | 0 refills | Status: DC | PRN

## 2022-05-14 NOTE — Progress Notes (Signed)
Acute Office Visit  Subjective:    Patient ID: Madison Stein, female    DOB: Nov 29, 1950, 71 y.o.   MRN: [id number]  Chief Complaint  Patient presents with   Nasal Congestion    HPI: Patient is in today for complaints of sinus pressure, pnd, cough and wheezing - says has had symptoms intermittently for several months but worse in the past several weeks.  She has tried taking delsym and mucinex.  She uses albuterol inhaler tid as needed Pt states in past week has felt worse with general malaise - no fever  Past Medical History:  Diagnosis Date   Anemia    Anxiety    Arthritis    Bronchitis 6629   Hx of   Complication of anesthesia    Depression    takes Cymbalta daily   Depression    Depression with anxiety    Diabetes mellitus without complication (HCC)    takes Metformin daily   Diabetes mellitus without complication ([place])    Type II   Family history of adverse reaction to anesthesia    patients mother and sister gets sick   Family history of anesthesia complication    mom and sister get very sick   Fibromyalgia    History of bronchitis 75yr ago   History of shingles    Hyperlipidemia    takes Fish Oil bid   Hyperlipidemia    Insomnia    has Ambien if needed   Joint pain    Nontoxic (diffuse) goiter    Osteoporosis    Peripheral neuropathy    takes Lyrica daily   Pneumonia 418yrago   hx of   Pneumonia    hx of   Polymyalgia rheumatica (HCC)    PONV (postoperative nausea and vomiting)    Pure hyperglyceridemia    Thyroid nodule    Type 2 diabetes mellitus with diabetic polyneuropathy (HCC)    Urgency of urination    Leakage wears pad    Past Surgical History:  Procedure Laterality Date   BACK SURGERY  1989   lumbar disc   CARPAL TUNNEL RELEASE Right 1985   CATARACT EXTRACTION  2020   CERVICAL FUSION  2009   CHOLECYSTECTOMY  2007   COLONOSCOPY     ESOPHAGOGASTRODUODENOSCOPY     EYE SURGERY Bilateral 2007   laser eye surgery    HAMMER TOE  SURGERY  2019   HIP ARTHROPLASTY Bilateral    REFRACTIVE SURGERY  2008   TOTAL HIP ARTHROPLASTY Right 11/20/2013   Procedure: RIGHT TOTAL HIP ARTHROPLASTY;  Surgeon: FrKerin SalenMD;  [facility]: [facility]ics;  Laterality: Right;   TOTAL HIP ARTHROPLASTY Left 08/30/2014   Procedure: TOTAL HIP ARTHROPLASTY;  Surgeon: FrKerin SalenMD;  [facility];  Laterality: Left;   TOTAL KNEE ARTHROPLASTY Left 06/06/2015   Procedure: TOTAL KNEE ARTHROPLASTY;  Surgeon: FrFrederik PearMD;  [facility]: [facility];  Laterality: Left;   TOTAL KNEE ARTHROPLASTY Right 09/23/2017   Procedure: TOTAL KNEE ARTHROPLASTY;  Surgeon: RoFrederik PearMD;  [facility]: [facility];  Laterality: Right;   TUBAL LIGATION  1987   TUMOR REMOVAL Right    Hand   tumor removed from right hand  2011   ulnar nerve release Left 1996    Family History  Problem Relation Age of Onset   Alzheimer's disease Mother    Fibromyalgia Mother    Kidney failure Father  Diabetes Father    High Cholesterol Father    Asthma Daughter    Healthy Daughter    Colon cancer Neg Hx    Stomach cancer Neg Hx    Esophageal cancer Neg Hx    Colon polyps Neg Hx     Social History   Socioeconomic History   Marital status: Divorced    Spouse name: Not on file   Number of children: 1   Years of education: Not on file   Highest education level: Some college, no degree  Occupational History   Occupation: Retired  Tobacco Use   Smoking status: Former   Smokeless tobacco: Never   Tobacco comments:    quit smoking in 1997  Vaping Use   Vaping Use: Never used  Substance and Sexual Activity   Alcohol use: Not Currently    Comment: rare beer   Drug use: No   Sexual activity: Never    Birth control/protection: Post-menopausal  Other Topics Concern   Not on file  Social History Narrative   Lives alone   Left handed   Caffeine: at the most 2 cups of coffee/day      **  Merged History Encounter **       Social Determinants of Health   Financial Resource Strain: Not on file  Food Insecurity: Not on file  Transportation Needs: Not on file  Physical Activity: Not on file  Stress: Not on file  Social Connections: Not on file  Intimate Partner Violence: Not on file    Outpatient Medications Prior to Visit  Medication Sig Dispense Refill   acetaminophen (TYLENOL) 650 MG CR tablet Take 650-1,300 mg by mouth 2 (two) times daily as needed for pain.     albuterol (VENTOLIN HFA) 108 (90 Base) MCG/ACT inhaler INHALE 2 PUFFS INTO THE LUNGS EVERY 6 HOURS AS NEEDED FOR WHEEZING OR SHORTNESS OF BREATH 8.5 g 1   aspirin EC 81 MG tablet Take 1 tablet (81 mg total) by mouth daily. 30 tablet 0   buPROPion (WELLBUTRIN XL) 300 MG 24 hr tablet TAKE 1 TABLET(300 MG) BY MOUTH DAILY 90 tablet 0   DULoxetine (CYMBALTA) 60 MG capsule TAKE ONE CAPSULE BY MOUTH TWICE DAILY (Patient taking differently: Take 60 mg by mouth 2 (two) times daily. TAKE ONE CAPSULE BY MOUTH TWICE DAILY) 180 capsule 0   FEROSUL 325 (65 Fe) MG tablet Take 1 tablet (325 mg total) by mouth daily. 90 tablet 0   FREESTYLE LITE test strip CHECK BLOOD SUGAR EVERY DAY (Patient taking differently: 1 each by Other route as needed for other.) 100 strip 1   gabapentin (NEURONTIN) 300 MG capsule 2 po qhs 60 capsule 1   glimepiride (AMARYL) 4 MG tablet TAKE 1 TABLET BY MOUTH EVERY DAY 90 tablet 0   icosapent Ethyl (VASCEPA) 1 g capsule TAKE 2 CAPSULES BY MOUTH TWICE DAILY WITH FOOD 120 capsule 3   methocarbamol (ROBAXIN) 500 MG tablet TAKE 1 TABLET(500 MG) BY MOUTH DAILY AS NEEDED FOR MUSCLE SPASMS (Patient taking differently: Take 500 mg by mouth as needed for muscle spasms.) 30 tablet 1   Multiple Vitamin (MULTIVITAMIN WITH MINERALS) TABS tablet Take 1 tablet by mouth daily.     omeprazole (PRILOSEC) 40 MG capsule Take 1 capsule (40 mg total) by mouth daily. TAKE 1 CAPSULE(40 MG) BY MOUTH DAILY 90 capsule 1   ramipril  (ALTACE) 2.5 MG capsule Take 2.5 mg by mouth daily.     rizatriptan (MAXALT-MLT) 10 MG disintegrating tablet   DISSOLVE 1 TABLET ON TONGUE, MAY REPEAT AT 2 HOUR INTERVAL IF NEEDED, MAX OF 3 TABLETS IN 24 HOURS (Patient taking differently: Take 10 mg by mouth as needed for migraine. DISSOLVE 1 TABLET ON TONGUE, MAY REPEAT AT 2 HOUR INTERVAL IF NEEDED, MAX OF 3 TABLETS IN 24 HOURS) 18 tablet 2   rosuvastatin (CRESTOR) 5 MG tablet Take 5 mg by mouth daily.     SitaGLIPtin-MetFORMIN HCl (JANUMET XR) 100-1000 MG TB24 Take 1 tablet by mouth daily with supper. 30 tablet 2   Vitamin D, Ergocalciferol, (DRISDOL) 1.25 MG (50000 UNIT) CAPS capsule TAKE 1 CAPSULE BY MOUTH EVERY 7 DAYS (Patient taking differently: Take 50,000 Units by mouth every 7 (seven) days.) 5 capsule 5   No facility-administered medications prior to visit.    Allergies  Allergen Reactions   Morphine And Related Nausea And Vomiting    "just don't tolerate it well"   Zestril [Lisinopril] Cough    Review of Systems CONSTITUTIONAL: see HPI E/N/T: see HPI CARDIOVASCULAR: Negative for chest pain, dizziness, palpitations and pedal edema.  RESPIRATORY: see HPI  INTEGUMENTARY: Negative for rash.          Objective:  PHYSICAL EXAM:   VS: BP 130/82 (BP Location: Left Arm, Patient Position: Sitting, Cuff Size: Large)   Pulse (!) 102   Temp (!) 97.5 F (36.4 C) (Temporal)   Ht 5' 3" (1.6 m)   Wt 216 lb (98 kg)   SpO2 95%   BMI 38.26 kg/m   GEN: Well nourished, well developed, in no acute distress  HEENT: normal external ears and nose - normal external auditory canals and TMS - - Lips, Teeth and Gums - normal  Oropharynx - mild erythema Cardiac: RRR; no murmurs,  Respiratory:  scattered rhonchi and wheezes throughout both lung fields Skin: warm and dry, no rash   Office Visit on 05/14/2022  Component Date Value Ref Range Status   SARS Coronavirus 2 Ag 05/14/2022 Negative  Negative Final     Health Maintenance Due   Topic Date Due   Medicare Annual Wellness (AWV)  10/01/2019   FOOT EXAM  05/10/2022    There are no preventive care reminders to display for this patient.   Lab Results  Component Value Date   TSH 1.440 03/19/2022   Lab Results  Component Value Date   WBC 10.8 (H) 04/16/2022   HGB 12.1 04/16/2022   HCT 38.5 04/16/2022   MCV 79.0 04/16/2022   PLT 360.0 04/16/2022   Lab Results  Component Value Date   NA 139 03/19/2022   K 5.1 03/19/2022   CO2 26 03/19/2022   GLUCOSE 225 (H) 03/19/2022   BUN 12 03/19/2022   CREATININE 1.15 (H) 03/19/2022   BILITOT 0.4 03/19/2022   ALKPHOS 97 03/19/2022   AST 14 03/19/2022   ALT 10 03/19/2022   PROT 6.9 03/19/2022   ALBUMIN 4.2 03/19/2022   CALCIUM 9.5 03/19/2022   ANIONGAP 7 09/24/2017   EGFR 52 (L) 03/19/2022   Lab Results  Component Value Date   CHOL 147 03/19/2022   Lab Results  Component Value Date   HDL 57 03/19/2022   Lab Results  Component Value Date   LDLCALC 58 03/19/2022   Lab Results  Component Value Date   TRIG 199 (H) 03/19/2022   Lab Results  Component Value Date   CHOLHDL 2.6 03/19/2022   Lab Results  Component Value Date   HGBA1C 8.2 (H) 03/19/2022       Assessment &   Plan:   Problem List Items Addressed This Visit       Respiratory   Bronchitis - Primary   Relevant Medications   amoxicillin-clavulanate (AUGMENTIN) 875-125 MG tablet   HYDROcodone bit-homatropine (HYDROMET) 5-1.5 MG/5ML syrup   Acute upper respiratory infection   Relevant Orders   POC COVID-19 BinaxNow (Completed)     Other   Bronchospasm   Relevant Medications   predniSONE (DELTASONE) 20 MG tablet   budesonide-formoterol (SYMBICORT) 160-4.5 MCG/ACT inhaler   Meds ordered this encounter  Medications   predniSONE (DELTASONE) 20 MG tablet    Sig: Take 3 tablets (60 mg total) by mouth daily with breakfast for 3 days, THEN 2 tablets (40 mg total) daily with breakfast for 3 days, THEN 1 tablet (20 mg total) daily with  breakfast for 3 days.    Dispense:  18 tablet    Refill:  0    Order Specific Question:   Supervising Provider    Answer:   Shelton Silvas   amoxicillin-clavulanate (AUGMENTIN) 875-125 MG tablet    Sig: Take 1 tablet by mouth 2 (two) times daily.    Dispense:  20 tablet    Refill:  0    Order Specific Question:   Supervising Provider    Answer:   Shelton Silvas   HYDROcodone bit-homatropine (HYDROMET) 5-1.5 MG/5ML syrup    Sig: Take 5 mLs by mouth every 6 (six) hours as needed.    Dispense:  120 mL    Refill:  0    Order Specific Question:   Supervising Provider    Answer:   Shelton Silvas   budesonide-formoterol (SYMBICORT) 160-4.5 MCG/ACT inhaler    Sig: Inhale 2 puffs into the lungs 2 (two) times daily.    Dispense:  1 each    Refill:  3    Order Specific Question:   Supervising Provider    AnswerShelton Silvas    Orders Placed This Encounter  Procedures   POC COVID-19 BinaxNow     Follow-up: Return if symptoms worsen or fail to improve.  An After Visit Summary was printed and given to the patient.  Yetta Flock [facility] [phone]

## 2022-05-18 ENCOUNTER — Telehealth: Payer: Self-pay | Admitting: Cardiology

## 2022-05-18 NOTE — Telephone Encounter (Signed)
Patient returning call for monitor results. 

## 2022-05-18 NOTE — Telephone Encounter (Signed)
Results reviewed with pt as per Dr. Krasowski's note.  Pt verbalized understanding and had no additional questions. Routed to PCP  

## 2022-06-05 ENCOUNTER — Other Ambulatory Visit: Payer: Self-pay | Admitting: Physician Assistant

## 2022-06-05 DIAGNOSIS — E1142 Type 2 diabetes mellitus with diabetic polyneuropathy: Secondary | ICD-10-CM

## 2022-06-06 ENCOUNTER — Other Ambulatory Visit: Payer: Self-pay | Admitting: Physician Assistant

## 2022-06-07 ENCOUNTER — Other Ambulatory Visit: Payer: Self-pay

## 2022-06-07 ENCOUNTER — Ambulatory Visit: Payer: PPO | Admitting: Cardiology

## 2022-06-07 MED ORDER — FEROSUL 325 (65 FE) MG PO TABS: 325.0000 mg | ORAL_TABLET | Freq: Every day | ORAL | 0 refills | Status: DC

## 2022-06-11 ENCOUNTER — Other Ambulatory Visit: Payer: Self-pay | Admitting: Physician Assistant

## 2022-06-26 ENCOUNTER — Ambulatory Visit (INDEPENDENT_AMBULATORY_CARE_PROVIDER_SITE_OTHER): Payer: PPO | Admitting: Physician Assistant

## 2022-06-26 ENCOUNTER — Encounter: Payer: Self-pay | Admitting: Physician Assistant

## 2022-06-26 VITALS — BP 100/64 | HR 96 | Temp 97.1°F | Ht 63.0 in | Wt 217.0 lb

## 2022-06-26 DIAGNOSIS — Z23 Encounter for immunization: Secondary | ICD-10-CM | POA: Diagnosis not present

## 2022-06-26 DIAGNOSIS — E559 Vitamin D deficiency, unspecified: Secondary | ICD-10-CM | POA: Diagnosis not present

## 2022-06-26 DIAGNOSIS — M5416 Radiculopathy, lumbar region: Secondary | ICD-10-CM

## 2022-06-26 DIAGNOSIS — Z1231 Encounter for screening mammogram for malignant neoplasm of breast: Secondary | ICD-10-CM

## 2022-06-26 DIAGNOSIS — D508 Other iron deficiency anemias: Secondary | ICD-10-CM | POA: Diagnosis not present

## 2022-06-26 DIAGNOSIS — F419 Anxiety disorder, unspecified: Secondary | ICD-10-CM

## 2022-06-26 DIAGNOSIS — E782 Mixed hyperlipidemia: Secondary | ICD-10-CM

## 2022-06-26 DIAGNOSIS — E1142 Type 2 diabetes mellitus with diabetic polyneuropathy: Secondary | ICD-10-CM

## 2022-06-26 MED ORDER — RAMIPRIL 2.5 MG PO CAPS: 2.5000 mg | ORAL_CAPSULE | Freq: Every day | ORAL | 1 refills | Status: AC

## 2022-06-26 MED ORDER — GABAPENTIN 300 MG PO CAPS: ORAL_CAPSULE | ORAL | 0 refills | Status: DC

## 2022-06-26 MED ORDER — ROSUVASTATIN CALCIUM 5 MG PO TABS: 5.0000 mg | ORAL_TABLET | Freq: Every day | ORAL | 1 refills | Status: AC

## 2022-06-26 NOTE — Progress Notes (Signed)
Subjective:  Patient ID: Madison Stein, female    DOB: 03-Feb-1951  Age: 71 y.o. MRN: [id number]  Chief Complaint  Patient presents with   Diabetes     Pt with history of diabetes - pt states that she is taking her medication as directed - checking glucose and ranging up to 170- has not been watching diet but is determined to do better She is taking glimepiride '4mg'$ , janumet XR 100/1000 , ramipril 2.'5mg'$   and crestor '5mg'$  She is going to schedule for retinopathy screen in our office  Pt with history of depression with anxiety - states her symptoms are stable on current medications - she is taking wellbutrin '300mg'$  and cymbalta '60mg'$   Pt with history of iron def anemia - currently she is on daily supplements - she has had GI evaluation with normal colonoscopy (except for polyp) and endoscopy - is due for repeat labwork and will do that today  Pt with history of vit D def - currently on weekly supplement - labwork due  Pt did go for cardiology consult for exertional dyspnea - she is due for follow up appt and will be scheduling that  Pt with history of GERD - stable on prilosec '40mg'$  qd  Pt would like flu shot today and schedule for mammogram (due in spring) Current Outpatient Medications on File Prior to Visit  Medication Sig Dispense Refill   acetaminophen (TYLENOL) 650 MG CR tablet Take 650-1,300 mg by mouth 2 (two) times daily as needed for pain.     albuterol (VENTOLIN HFA) 108 (90 Base) MCG/ACT inhaler INHALE 2 PUFFS INTO THE LUNGS EVERY 6 HOURS AS NEEDED FOR WHEEZING OR SHORTNESS OF BREATH 8.5 g 1   aspirin EC 81 MG tablet Take 1 tablet (81 mg total) by mouth daily. 30 tablet 0   budesonide-formoterol (SYMBICORT) 160-4.5 MCG/ACT inhaler Inhale 2 puffs into the lungs 2 (two) times daily. 1 each 3   buPROPion (WELLBUTRIN XL) 300 MG 24 hr tablet TAKE 1 TABLET(300 MG) BY MOUTH DAILY 90 tablet 0   DULoxetine (CYMBALTA) 60 MG capsule TAKE ONE CAPSULE BY MOUTH TWICE DAILY (Patient taking  differently: Take 60 mg by mouth 2 (two) times daily. TAKE ONE CAPSULE BY MOUTH TWICE DAILY) 180 capsule 0   FEROSUL 325 (65 Fe) MG tablet Take 1 tablet (325 mg total) by mouth daily. 90 tablet 0   FREESTYLE LITE test strip CHECK BLOOD SUGAR EVERY DAY (Patient taking differently: 1 each by Other route as needed for other.) 100 strip 1   glimepiride (AMARYL) 4 MG tablet TAKE 1 TABLET BY MOUTH EVERY DAY 90 tablet 0   icosapent Ethyl (VASCEPA) 1 g capsule TAKE 2 CAPSULES BY MOUTH TWICE DAILY WITH FOOD 120 capsule 3   methocarbamol (ROBAXIN) 500 MG tablet TAKE 1 TABLET(500 MG) BY MOUTH DAILY AS NEEDED FOR MUSCLE SPASMS 30 tablet 1   Multiple Vitamin (MULTIVITAMIN WITH MINERALS) TABS tablet Take 1 tablet by mouth daily.     omeprazole (PRILOSEC) 40 MG capsule Take 1 capsule (40 mg total) by mouth daily. TAKE 1 CAPSULE(40 MG) BY MOUTH DAILY 90 capsule 1   rizatriptan (MAXALT-MLT) 10 MG disintegrating tablet DISSOLVE 1 TABLET ON TONGUE, MAY REPEAT AT 2 HOUR INTERVAL IF NEEDED, MAX OF 3 TABLETS IN 24 HOURS (Patient taking differently: Take 10 mg by mouth as needed for migraine. DISSOLVE 1 TABLET ON TONGUE, MAY REPEAT AT 2 HOUR INTERVAL IF NEEDED, MAX OF 3 TABLETS IN 24 HOURS) 18 tablet 2  SitaGLIPtin-MetFORMIN HCl (JANUMET XR) [phone] MG TB24 Take 1 tablet by mouth daily with supper. 30 tablet 2   Vitamin D, Ergocalciferol, (DRISDOL) 1.25 MG (50000 UNIT) CAPS capsule TAKE 1 CAPSULE BY MOUTH EVERY 7 DAYS (Patient taking differently: Take 50,000 Units by mouth every 7 (seven) days.) 5 capsule 5   No current facility-administered medications on file prior to visit.   Past Medical History:  Diagnosis Date   Anemia    Anxiety    Arthritis    Bronchitis 2542   Hx of   Complication of anesthesia    Depression    takes Cymbalta daily   Depression    Depression with anxiety    Diabetes mellitus without complication ([place])    takes Metformin daily   Diabetes mellitus without complication ([place])    Type II    Family history of adverse reaction to anesthesia    patients mother and sister gets sick   Family history of anesthesia complication    mom and sister get very sick   Fibromyalgia    History of bronchitis 43yr ago   History of shingles    Hyperlipidemia    takes Fish Oil bid   Hyperlipidemia    Insomnia    has Ambien if needed   Joint pain    Nontoxic (diffuse) goiter    Osteoporosis    Peripheral neuropathy    takes Lyrica daily   Pneumonia 470yrago   hx of   Pneumonia    hx of   Polymyalgia rheumatica (HCC)    PONV (postoperative nausea and vomiting)    Pure hyperglyceridemia    Thyroid nodule    Type 2 diabetes mellitus with diabetic polyneuropathy (HCC)    Urgency of urination    Leakage wears pad   Past Surgical History:  Procedure Laterality Date   BACK SURGERY  1989   lumbar disc   CARPAL TUNNEL RELEASE Right 1985   CATARACT EXTRACTION  2020   CERVICAL FUSION  2009   CHOLECYSTECTOMY  2007   COLONOSCOPY     ESOPHAGOGASTRODUODENOSCOPY     EYE SURGERY Bilateral 2007   laser eye surgery    HAMMER TOE SURGERY  2019   HIP ARTHROPLASTY Bilateral    REFRACTIVE SURGERY  2008   TOTAL HIP ARTHROPLASTY Right 11/20/2013   Procedure: RIGHT TOTAL HIP ARTHROPLASTY;  Surgeon: FrKerin SalenMD;  [facility];  Laterality: Right;   TOTAL HIP ARTHROPLASTY Left 08/30/2014   Procedure: TOTAL HIP ARTHROPLASTY;  Surgeon: FrKerin SalenMD;  [facility];  Laterality: Left;   TOTAL KNEE ARTHROPLASTY Left 06/06/2015   Procedure: TOTAL KNEE ARTHROPLASTY;  Surgeon: FrFrederik PearMD;  [facility];  Laterality: Left;   TOTAL KNEE ARTHROPLASTY Right 09/23/2017   Procedure: TOTAL KNEE ARTHROPLASTY;  Surgeon: RoFrederik PearMD;  [facility]: [facility]ics;  Laterality: Right;   TUBAL LIGATION  1987   TUMOR REMOVAL Right    Hand   tumor removed from right hand  2011   ulnar nerve release Left 1996     Family History  Problem Relation Age of Onset   Alzheimer's disease Mother    Fibromyalgia Mother    Kidney failure Father    Diabetes Father    High Cholesterol Father    Asthma Daughter    Healthy Daughter    Colon cancer Neg Hx    Stomach cancer Neg Hx  Esophageal cancer Neg Hx    Colon polyps Neg Hx    Social History   Socioeconomic History   Marital status: Divorced    Spouse name: Not on file   Number of children: 1   Years of education: Not on file   Highest education level: Some college, no degree  Occupational History   Occupation: Retired  Tobacco Use   Smoking status: Former   Smokeless tobacco: Never   Tobacco comments:    quit smoking in 1997  Vaping Use   Vaping Use: Never used  Substance and Sexual Activity   Alcohol use: Not Currently    Comment: rare beer   Drug use: No   Sexual activity: Never    Birth control/protection: Post-menopausal  Other Topics Concern   Not on file  Social History Narrative   Lives alone   Left handed   Caffeine: at the most 2 cups of coffee/day      ** Merged History Encounter **       Social Determinants of Health   Financial Resource Strain: Not on file  Food Insecurity: Not on file  Transportation Needs: Not on file  Physical Activity: Not on file  Stress: Not on file  Social Connections: Not on file   CONSTITUTIONAL: Negative for chills, fatigue, fever, unintentional weight gain and unintentional weight loss.  E/N/T: Negative for ear pain, nasal congestion and sore throat.  CARDIOVASCULAR: Negative for chest pain, dizziness, palpitations and pedal edema.  RESPIRATORY: Negative for recent cough and dyspnea.  GASTROINTESTINAL: Negative for abdominal pain, acid reflux symptoms, constipation, diarrhea, nausea and vomiting.  MSK: Negative for arthralgias and myalgias.  INTEGUMENTARY: Negative for rash.  NEUROLOGICAL: Negative for dizziness and headaches.  PSYCHIATRIC: Negative for sleep disturbance and  to question depression screen.  Negative for depression, negative for anhedonia.       Objective:  PHYSICAL EXAM:   VS: BP 100/64 (BP Location: Left Arm, Patient Position: Sitting, Cuff Size: Large)   Pulse 96   Temp (!) 97.1 F (36.2 C) (Temporal)   Ht '5\' 3"'$  (1.6 m)   Wt 217 lb (98.4 kg)   SpO2 92%   BMI 38.44 kg/m   GEN: Well nourished, well developed, in no acute distress  Cardiac: RRR; no murmurs, rubs, or gallops,no edema - Respiratory:  normal respiratory rate and pattern with no distress - normal breath sounds with no rales, rhonchi, wheezes or rubs MS: no deformity or atrophy  Skin: warm and dry, no rash  Neuro:  Alert and Oriented x 3, - CN II-Xii grossly intact Psych: euthymic mood, appropriate affect and demeanor   Lab Results  Component Value Date   WBC 10.8 (H) 04/16/2022   HGB 12.1 04/16/2022   HCT 38.5 04/16/2022   PLT 360.0 04/16/2022   GLUCOSE 225 (H) 03/19/2022   CHOL 147 03/19/2022   TRIG 199 (H) 03/19/2022   HDL 57 03/19/2022   LDLCALC 58 03/19/2022   ALT 10 03/19/2022   AST 14 03/19/2022   NA 139 03/19/2022   K 5.1 03/19/2022   CL 97 03/19/2022   CREATININE 1.15 (H) 03/19/2022   BUN 12 03/19/2022   CO2 26 03/19/2022   TSH 1.440 03/19/2022   INR 0.98 09/12/2017   HGBA1C 8.2 (H) 03/19/2022   MICROALBUR 10 03/30/2020      Assessment & Plan:   Problem List Items Addressed This Visit       Endocrine   Type 2 diabetes mellitus with diabetic polyneuropathy, without  long-term current use of insulin (HCC)   Relevant Medications   rosuvastatin (CRESTOR) 5 MG tablet   ramipril (ALTACE) 2.5 MG capsule   Other Relevant Orders   CBC with Differential/Platelet   Comprehensive metabolic panel   Lipid panel   Hemoglobin A1c Continue current medications     Other   Mixed hyperlipidemia   Relevant Medications   rosuvastatin (CRESTOR) 5 MG tablet   ramipril (ALTACE) 2.5 MG capsule Continue to watch diet   Other Relevant Orders   Lipid  panel   Anxiety Continue current meds   Vitamin D insufficiency   Relevant Orders   VITAMIN D 25 Hydroxy (Vit-D Deficiency, Fractures) Continue meds   Anemia - Primary   Relevant Orders   CBC with Differential/Platelet   Iron, TIBC and Ferritin Panel    Follow up with GI as scheduled      GERD Continue current med      Need flu shot Fluad given      Breast cancer screening Mammogram scheduled   Exertional dyspnea   Relevant Orders   Follow up with cardiology as directed  .  Meds ordered this encounter  Medications   rosuvastatin (CRESTOR) 5 MG tablet    Sig: Take 1 tablet (5 mg total) by mouth daily.    Dispense:  90 tablet    Refill:  1    Order Specific Question:   Supervising Provider    Answer:   Shelton Silvas   ramipril (ALTACE) 2.5 MG capsule    Sig: Take 1 capsule (2.5 mg total) by mouth daily.    Dispense:  90 capsule    Refill:  1    Order Specific Question:   Supervising Provider    Answer:   COX, Lynder Parents   gabapentin (NEURONTIN) 300 MG capsule    Sig: 1 po qam and  2 CAPSULES BY MOUTH EVERY NIGHT AT BEDTIME    Dispense:  270 capsule    Refill:  0    Order Specific Question:   Supervising Provider    AnswerShelton Silvas     Orders Placed This Encounter  Procedures   MM DIGITAL SCREENING BILATERAL   Flu Vaccine QUAD High Dose(Fluad)   CBC with Differential/Platelet   Comprehensive metabolic panel   TSH   Lipid panel   Hemoglobin A1c   VITAMIN D 25 Hydroxy (Vit-D Deficiency, Fractures)   Iron, TIBC and Ferritin Panel     Follow-up: Return in about 3 months (around 09/25/2022) for fasting with me --- schedule 08/21/22 for [facility] wellness with Maudie Mercury and also for retinopathy screen.  An After Visit Summary was printed and given to the patient.  Yetta Flock [facility] [phone]

## 2022-06-27 LAB — CBC WITH DIFFERENTIAL/PLATELET
Basophils Absolute: 0.1 10*3/uL (ref 0.0–0.2)
Basos: 1 %
EOS (ABSOLUTE): 0.5 10*3/uL — ABNORMAL HIGH (ref 0.0–0.4)
Eos: 4 %
Hematocrit: 39.9 % (ref 34.0–46.6)
Hemoglobin: 12.8 g/dL (ref 11.1–15.9)
Immature Grans (Abs): 0.1 10*3/uL (ref 0.0–0.1)
Immature Granulocytes: 1 %
Lymphocytes Absolute: 3.1 10*3/uL (ref 0.7–3.1)
Lymphs: 25 %
MCH: 26.2 pg — ABNORMAL LOW (ref 26.6–33.0)
MCHC: 32.1 g/dL (ref 31.5–35.7)
MCV: 82 fL (ref 79–97)
Monocytes Absolute: 0.7 10*3/uL (ref 0.1–0.9)
Monocytes: 6 %
Neutrophils Absolute: 7.9 10*3/uL — ABNORMAL HIGH (ref 1.4–7.0)
Neutrophils: 63 %
Platelets: 333 10*3/uL (ref 150–450)
RBC: 4.88 x10E6/uL (ref 3.77–5.28)
RDW: 13.9 % (ref 11.7–15.4)
WBC: 12.4 10*3/uL — ABNORMAL HIGH (ref 3.4–10.8)

## 2022-06-27 LAB — LIPID PANEL
Chol/HDL Ratio: 3.1 ratio (ref 0.0–4.4)
Cholesterol, Total: 165 mg/dL (ref 100–199)
HDL: 54 mg/dL (ref >39)
LDL Chol Calc (NIH): 76 mg/dL (ref 0–99)
Triglycerides: 215 mg/dL — ABNORMAL HIGH (ref 0–149)
VLDL Cholesterol Cal: 35 mg/dL (ref 5–40)

## 2022-06-27 LAB — IRON,TIBC AND FERRITIN PANEL
Ferritin: 34 ng/mL (ref 15–150)
Iron Saturation: 17 % (ref 15–55)
Iron: 59 ug/dL (ref 27–139)
Total Iron Binding Capacity: 347 ug/dL (ref 250–450)
UIBC: 288 ug/dL (ref 118–369)

## 2022-06-27 LAB — COMPREHENSIVE METABOLIC PANEL
ALT: 11 IU/L (ref 0–32)
AST: 14 IU/L (ref 0–40)
Albumin/Globulin Ratio: 1.7 (ref 1.2–2.2)
Albumin: 4.3 g/dL (ref 3.9–4.9)
Alkaline Phosphatase: 89 IU/L (ref 44–121)
BUN/Creatinine Ratio: 18 (ref 12–28)
BUN: 19 mg/dL (ref 8–27)
Bilirubin Total: 0.3 mg/dL (ref 0.0–1.2)
CO2: 24 mmol/L (ref 20–29)
Calcium: 9.7 mg/dL (ref 8.7–10.3)
Chloride: 100 mmol/L (ref 96–106)
Creatinine, Ser: 1.06 mg/dL — ABNORMAL HIGH (ref 0.57–1.00)
Globulin, Total: 2.5 g/dL (ref 1.5–4.5)
Glucose: 162 mg/dL — ABNORMAL HIGH (ref 70–99)
Potassium: 5.1 mmol/L (ref 3.5–5.2)
Sodium: 140 mmol/L (ref 134–144)
Total Protein: 6.8 g/dL (ref 6.0–8.5)
eGFR: 57 mL/min/{1.73_m2} — ABNORMAL LOW (ref >59)

## 2022-06-27 LAB — VITAMIN D 25 HYDROXY (VIT D DEFICIENCY, FRACTURES): Vit D, 25-Hydroxy: 40.5 ng/mL (ref 30.0–100.0)

## 2022-06-27 LAB — HEMOGLOBIN A1C
Est. average glucose Bld gHb Est-mCnc: 177 mg/dL
Hgb A1c MFr Bld: 7.8 % — ABNORMAL HIGH (ref 4.8–5.6)

## 2022-06-27 LAB — CARDIOVASCULAR RISK ASSESSMENT

## 2022-06-27 LAB — TSH: TSH: 2.08 u[IU]/mL (ref 0.450–4.500)

## 2022-07-31 ENCOUNTER — Other Ambulatory Visit: Payer: Self-pay | Admitting: Physician Assistant

## 2022-07-31 DIAGNOSIS — F419 Anxiety disorder, unspecified: Secondary | ICD-10-CM

## 2022-08-18 ENCOUNTER — Other Ambulatory Visit: Payer: Self-pay | Admitting: Physician Assistant

## 2022-08-18 DIAGNOSIS — F419 Anxiety disorder, unspecified: Secondary | ICD-10-CM

## 2022-08-21 ENCOUNTER — Ambulatory Visit: Payer: PPO

## 2022-09-02 ENCOUNTER — Other Ambulatory Visit: Payer: Self-pay | Admitting: Physician Assistant

## 2022-09-02 DIAGNOSIS — E1142 Type 2 diabetes mellitus with diabetic polyneuropathy: Secondary | ICD-10-CM

## 2022-10-03 ENCOUNTER — Ambulatory Visit (INDEPENDENT_AMBULATORY_CARE_PROVIDER_SITE_OTHER): Payer: PPO | Admitting: Physician Assistant

## 2022-10-03 ENCOUNTER — Encounter: Payer: Self-pay | Admitting: Physician Assistant

## 2022-10-03 VITALS — BP 120/70 | HR 100 | Temp 96.1°F | Resp 20 | Ht 63.0 in | Wt 218.8 lb

## 2022-10-03 DIAGNOSIS — M353 Polymyalgia rheumatica: Secondary | ICD-10-CM

## 2022-10-03 DIAGNOSIS — D508 Other iron deficiency anemias: Secondary | ICD-10-CM | POA: Diagnosis not present

## 2022-10-03 DIAGNOSIS — E1142 Type 2 diabetes mellitus with diabetic polyneuropathy: Secondary | ICD-10-CM

## 2022-10-03 DIAGNOSIS — E559 Vitamin D deficiency, unspecified: Secondary | ICD-10-CM

## 2022-10-03 DIAGNOSIS — K2100 Gastro-esophageal reflux disease with esophagitis, without bleeding: Secondary | ICD-10-CM

## 2022-10-03 DIAGNOSIS — E782 Mixed hyperlipidemia: Secondary | ICD-10-CM | POA: Diagnosis not present

## 2022-10-03 DIAGNOSIS — F419 Anxiety disorder, unspecified: Secondary | ICD-10-CM

## 2022-10-03 DIAGNOSIS — J060 Acute laryngopharyngitis: Secondary | ICD-10-CM

## 2022-10-03 MED ORDER — VITAMIN D (ERGOCALCIFEROL) 1.25 MG (50000 UNIT) PO CAPS: 50000.0000 [IU] | ORAL_CAPSULE | ORAL | 1 refills | Status: AC

## 2022-10-03 MED ORDER — AZITHROMYCIN 250 MG PO TABS: ORAL_TABLET | ORAL | 0 refills | Status: AC

## 2022-10-03 MED ORDER — PREDNISONE 20 MG PO TABS: ORAL_TABLET | ORAL | 0 refills | Status: AC

## 2022-10-03 MED ORDER — ICOSAPENT ETHYL 1 G PO CAPS: ORAL_CAPSULE | ORAL | 5 refills | Status: AC

## 2022-10-03 NOTE — Progress Notes (Signed)
Subjective:  Patient ID: Madison Stein, female    DOB: 24-Nov-1950  Age: 72 y.o. MRN: [id number]  Chief Complaint  Patient presents with   Diabetes     Pt with history of diabetes - pt states that she is taking her medication as directed - checking glucose and ranging up to 190- says has been trying to watch diet She is taking glimepiride 4mg , janumet XR 100/1000 , ramipril 2.5mg   and crestor 5mg  She was going to schedule for retinopathy screen in our office but did not get done yet - will schedule  Pt with history of depression with anxiety - states her symptoms are stable on current medications - she is taking wellbutrin 300mg  and cymbalta 60mg   Pt with history of iron def anemia - currently she is on daily supplements - she has had GI evaluation with normal colonoscopy (except for polyp) and endoscopy - is due for repeat labwork denies fatigue/malaise  Pt with history of vit D def - currently on weekly supplement - labwork due  Pt with history of GERD - stable on prilosec 40mg  qd  Pt complains of mild cough and congestion for the past several weeks - now with productive cough and mild wheezing - has not been taking her inhalers as directed Current Outpatient Medications on File Prior to Visit  Medication Sig Dispense Refill   acetaminophen (TYLENOL) 650 MG CR tablet Take 650-1,300 mg by mouth 2 (two) times daily as needed for pain.     albuterol (VENTOLIN HFA) 108 (90 Base) MCG/ACT inhaler INHALE 2 PUFFS INTO THE LUNGS EVERY 6 HOURS AS NEEDED FOR WHEEZING OR SHORTNESS OF BREATH 8.5 g 1   aspirin EC 81 MG tablet Take 1 tablet (81 mg total) by mouth daily. 30 tablet 0   budesonide-formoterol (SYMBICORT) 160-4.5 MCG/ACT inhaler Inhale 2 puffs into the lungs 2 (two) times daily. 1 each 3   buPROPion (WELLBUTRIN XL) 300 MG 24 hr tablet TAKE 1 TABLET(300 MG) BY MOUTH DAILY 90 tablet 0   DULoxetine (CYMBALTA) 60 MG capsule TAKE 1 CAPSULE BY MOUTH TWICE DAILY 180 capsule 0   FEROSUL 325 (65  Fe) MG tablet TAKE 1 TABLET(325 MG) BY MOUTH DAILY 90 tablet 0   FREESTYLE LITE test strip CHECK BLOOD SUGAR EVERY DAY (Patient taking differently: 1 each by Other route as needed for other.) 100 strip 1   gabapentin (NEURONTIN) 300 MG capsule 1 po qam and  2 CAPSULES BY MOUTH EVERY NIGHT AT BEDTIME 270 capsule 0   glimepiride (AMARYL) 4 MG tablet TAKE 1 TABLET BY MOUTH EVERY DAY 90 tablet 0   methocarbamol (ROBAXIN) 500 MG tablet TAKE 1 TABLET(500 MG) BY MOUTH DAILY AS NEEDED FOR MUSCLE SPASMS 30 tablet 1   Multiple Vitamin (MULTIVITAMIN WITH MINERALS) TABS tablet Take 1 tablet by mouth daily.     omeprazole (PRILOSEC) 40 MG capsule Take 1 capsule (40 mg total) by mouth daily. TAKE 1 CAPSULE(40 MG) BY MOUTH DAILY 90 capsule 1   ramipril (ALTACE) 2.5 MG capsule Take 1 capsule (2.5 mg total) by mouth daily. 90 capsule 1   rizatriptan (MAXALT-MLT) 10 MG disintegrating tablet DISSOLVE 1 TABLET ON TONGUE, MAY REPEAT AT 2 HOUR INTERVAL IF NEEDED, MAX OF 3 TABLETS IN 24 HOURS (Patient taking differently: Take 10 mg by mouth as needed for migraine. DISSOLVE 1 TABLET ON TONGUE, MAY REPEAT AT 2 HOUR INTERVAL IF NEEDED, MAX OF 3 TABLETS IN 24 HOURS) 18 tablet 2   rosuvastatin (CRESTOR) 5  MG tablet Take 1 tablet (5 mg total) by mouth daily. 90 tablet 1   SitaGLIPtin-MetFORMIN HCl (JANUMET XR) [id number] MG TB24 Take 1 tablet by mouth daily with supper. 30 tablet 2   No current facility-administered medications on file prior to visit.   Past Medical History:  Diagnosis Date   Anemia    Anxiety    Arthritis    Bronchitis 0000000   Hx of   Complication of anesthesia    Depression    takes Cymbalta daily   Depression    Depression with anxiety    Diabetes mellitus without complication ([employer])    takes Metformin daily   Diabetes mellitus without complication ([place])    Type II   Family history of adverse reaction to anesthesia    patients mother and sister gets sick   Family history of anesthesia complication     mom and sister get very sick   Fibromyalgia    History of bronchitis 15yrs ago   History of shingles    Hyperlipidemia    takes Fish Oil bid   Hyperlipidemia    Insomnia    has Ambien if needed   Joint pain    Nontoxic (diffuse) goiter    Osteoporosis    Peripheral neuropathy    takes Lyrica daily   Pneumonia 30yrs ago   hx of   Pneumonia    hx of   Polymyalgia rheumatica (HCC)    PONV (postoperative nausea and vomiting)    Pure hyperglyceridemia    Thyroid nodule    Type 2 diabetes mellitus with diabetic polyneuropathy (HCC)    Urgency of urination    Leakage wears pad   Past Surgical History:  Procedure Laterality Date   BACK SURGERY  1989   lumbar disc   CARPAL TUNNEL RELEASE Right 1985   CATARACT EXTRACTION  2020   CERVICAL FUSION  2009   CHOLECYSTECTOMY  2007   COLONOSCOPY     ESOPHAGOGASTRODUODENOSCOPY     EYE SURGERY Bilateral 2007   laser eye surgery    HAMMER TOE SURGERY  2019   HIP ARTHROPLASTY Bilateral    REFRACTIVE SURGERY  2008   TOTAL HIP ARTHROPLASTY Right 11/20/2013   Procedure: RIGHT TOTAL HIP ARTHROPLASTY;  Surgeon: Kerin Salen, MD;  Location: [place];  Service: Orthopedics;  Laterality: Right;   TOTAL HIP ARTHROPLASTY Left 08/30/2014   Procedure: TOTAL HIP ARTHROPLASTY;  Surgeon: Kerin Salen, MD;  Location: [place];  Service: Orthopedics;  Laterality: Left;   TOTAL KNEE ARTHROPLASTY Left 06/06/2015   Procedure: TOTAL KNEE ARTHROPLASTY;  Surgeon: Frederik Pear, MD;  Location: [place];  Service: Orthopedics;  Laterality: Left;   TOTAL KNEE ARTHROPLASTY Right 09/23/2017   Procedure: TOTAL KNEE ARTHROPLASTY;  Surgeon: Frederik Pear, MD;  Location: [place];  Service: Orthopedics;  Laterality: Right;   TUBAL LIGATION  1987   TUMOR REMOVAL Right    Hand   tumor removed from right hand  2011   ulnar nerve release Left 1996    Family History  Problem Relation Age of Onset   Alzheimer's disease Mother    Fibromyalgia Mother    Kidney failure Father     Diabetes Father    High Cholesterol Father    Asthma Daughter    Healthy Daughter    Colon cancer Neg Hx    Stomach cancer Neg Hx    Esophageal cancer Neg Hx    Colon polyps Neg Hx    Social History  Socioeconomic History   Marital status: Divorced    Spouse name: Not on file   Number of children: 1   Years of education: Not on file   Highest education level: Some college, no degree  Occupational History   Occupation: Retired  Tobacco Use   Smoking status: Former   Smokeless tobacco: Never   Tobacco comments:    quit smoking in 1997  Vaping Use   Vaping Use: Never used  Substance and Sexual Activity   Alcohol use: Not Currently    Comment: rare beer   Drug use: No   Sexual activity: Never    Birth control/protection: Post-menopausal  Other Topics Concern   Not on file  Social History Narrative   Lives alone   Left handed   Caffeine: at the most 2 cups of coffee/day      ** Merged History Encounter **       Social Determinants of Health   Financial Resource Strain: Low Risk  (10/02/2022)   Overall Financial Resource Strain (CARDIA)    Difficulty of Paying Living Expenses: Not very hard  Food Insecurity: No Food Insecurity (10/02/2022)   Hunger Vital Sign    Worried About Running Out of Food in the Last Year: Never true    Justice in the Last Year: Never true  Transportation Needs: No Transportation Needs (10/02/2022)   PRAPARE - Hydrologist (Medical): No    Lack of Transportation (Non-Medical): No  Physical Activity: Insufficiently Active (10/02/2022)   Exercise Vital Sign    Days of Exercise per Week: 1 day    Minutes of Exercise per Session: 20 min  Stress: Stress Concern Present (10/02/2022)   Carrier    Feeling of Stress : To some extent  Social Connections: Moderately Isolated (10/02/2022)   Social Connection and Isolation Panel [NHANES]    Frequency  of Communication with Friends and Family: More than three times a week    Frequency of Social Gatherings with Friends and Family: Once a week    Attends Religious Services: More than 4 times per year    Active Member of [employer] or Organizations: No    Attends Music therapist: Not on file    Marital Status: Divorced   CONSTITUTIONAL: Negative for chills, fatigue, fever, unintentional weight gain and unintentional weight loss.  E/N/T: see HPI CARDIOVASCULAR: Negative for chest pain, dizziness, palpitations and pedal edema.  RESPIRATORY: see HPI GASTROINTESTINAL: Negative for abdominal pain, acid reflux symptoms, constipation, diarrhea, nausea and vomiting.  MSK: Negative for arthralgias and myalgias.  INTEGUMENTARY: Negative for rash.  NEUROLOGICAL: Negative for dizziness and headaches.  PSYCHIATRIC: Negative for sleep disturbance and to question depression screen.  Negative for depression, negative for anhedonia.        Objective:  PHYSICAL EXAM:   VS: BP 120/70 (BP Location: Left Arm, Patient Position: Sitting, Cuff Size: Large)   Pulse 100   Temp (!) 96.1 F (35.6 C) (Temporal)   Resp 20   Ht 5\' 3"  (1.6 m)   Wt 218 lb 12.8 oz (99.2 kg)   SpO2 98%   BMI 38.76 kg/m   GEN: Well nourished, well developed, in no acute distress  HEENT: normal external ears and nose - normal external auditory canals and TMS -  - Lips, Teeth and Gums - normal  Oropharynx - normal mucosa, palate, and posterior pharynx Neck: no JVD or masses - no  thyromegaly Cardiac: RRR; no murmurs, rubs, or gallops,no edema - no significant varicosities Respiratory: mild scattered rhonchi - clears with cough MS: no deformity or atrophy  Skin: warm and dry, no rash  Psych: euthymic mood, appropriate affect and demeanor   Lab Results  Component Value Date   WBC 12.4 (H) 06/26/2022   HGB 12.8 06/26/2022   HCT 39.9 06/26/2022   PLT 333 06/26/2022   GLUCOSE 162 (H) 06/26/2022   CHOL 165 06/26/2022    TRIG 215 (H) 06/26/2022   HDL 54 06/26/2022   LDLCALC 76 06/26/2022   ALT 11 06/26/2022   AST 14 06/26/2022   NA 140 06/26/2022   K 5.1 06/26/2022   CL 100 06/26/2022   CREATININE 1.06 (H) 06/26/2022   BUN 19 06/26/2022   CO2 24 06/26/2022   TSH 2.080 06/26/2022   INR 0.98 09/12/2017   HGBA1C 7.8 (H) 06/26/2022   MICROALBUR 10 03/30/2020      Assessment & Plan:   Problem List Items Addressed This Visit       Endocrine   Type 2 diabetes mellitus with diabetic polyneuropathy, without long-term current use of insulin (HCC)      Continue current meds   Urine microalbumin ordered   Other Relevant Orders   CBC with Differential/Platelet   Comprehensive metabolic panel   Lipid panel   Hemoglobin A1c Continue current medications     Other   Mixed hyperlipidemia   Relevant Medications   rosuvastatin (CRESTOR) 5 MG tablet   ramipril (ALTACE) 2.5 MG capsule Continue vascepa Continue to watch diet   Other Relevant Orders   Lipid panel   Anxiety Continue current meds   Vitamin D insufficiency   Relevant Orders   VITAMIN D 25 Hydroxy (Vit-D Deficiency, Fractures) Continue meds   Anemia - Primary   Relevant Orders   CBC with Differential/Platelet   Iron, TIBC and Ferritin Panel         GERD Continue current med      URI Use inhalers as directed Rx for zpack and prednisone taper         Exertional dyspnea   Relevant Orders   Follow up with cardiology as directed  .  Meds ordered this encounter  Medications   azithromycin (ZITHROMAX) 250 MG tablet    Sig: Take 2 tablets on day 1, then 1 tablet daily on days 2 through 5    Dispense:  6 tablet    Refill:  0    Order Specific Question:   Supervising Provider    Answer:   Shelton Silvas   predniSONE (DELTASONE) 20 MG tablet    Sig: Take 3 tablets (60 mg total) by mouth daily with breakfast for 3 days, THEN 2 tablets (40 mg total) daily with breakfast for 3 days, THEN 1 tablet (20 mg total) daily  with breakfast for 3 days.    Dispense:  18 tablet    Refill:  0    Order Specific Question:   Supervising Provider    Answer:   COX, Lynder Parents   icosapent Ethyl (VASCEPA) 1 g capsule    Sig: 2 po bid    Dispense:  120 capsule    Refill:  5    Order Specific Question:   Supervising Provider    Answer:   Shelton Silvas   Vitamin D, Ergocalciferol, (DRISDOL) 1.25 MG (50000 UNIT) CAPS capsule    Sig: Take 1 capsule (50,000 Units total) by mouth every 7 (seven)  days.    Dispense:  12 capsule    Refill:  1    Order Specific Question:   Supervising Provider    AnswerShelton Silvas     Orders Placed This Encounter  Procedures   CBC with Differential/Platelet   Comprehensive metabolic panel   Lipid panel   Hemoglobin A1c   Iron, TIBC and Ferritin Panel   VITAMIN D 25 Hydroxy (Vit-D Deficiency, Fractures)   Microalbumin/Creatinine Ratio, Urine     Follow-up: Return in about 3 months (around 01/03/2023) for chronic fasting follow up - also schedule mcr well with kim and retinopathy screen on 02/19/23.  An After Visit Summary was printed and given to the patient.  Yetta Flock [facility] [phone]

## 2022-10-04 LAB — CBC WITH DIFFERENTIAL/PLATELET
Basophils Absolute: 0.1 10*3/uL (ref 0.0–0.2)
Basos: 1 %
EOS (ABSOLUTE): 0.4 10*3/uL (ref 0.0–0.4)
Eos: 5 %
Hematocrit: 38.5 % (ref 34.0–46.6)
Hemoglobin: 12.4 g/dL (ref 11.1–15.9)
Immature Grans (Abs): 0.1 10*3/uL (ref 0.0–0.1)
Immature Granulocytes: 1 %
Lymphocytes Absolute: 1.9 10*3/uL (ref 0.7–3.1)
Lymphs: 24 %
MCH: 27.5 pg (ref 26.6–33.0)
MCHC: 32.2 g/dL (ref 31.5–35.7)
MCV: 85 fL (ref 79–97)
Monocytes Absolute: 0.5 10*3/uL (ref 0.1–0.9)
Monocytes: 7 %
Neutrophils Absolute: 4.9 10*3/uL (ref 1.4–7.0)
Neutrophils: 62 %
Platelets: 285 10*3/uL (ref 150–450)
RBC: 4.51 x10E6/uL (ref 3.77–5.28)
RDW: 13.5 % (ref 11.7–15.4)
WBC: 7.9 10*3/uL (ref 3.4–10.8)

## 2022-10-04 LAB — MICROALBUMIN / CREATININE URINE RATIO
Creatinine, Urine: 93.8 mg/dL
Microalb/Creat Ratio: 13 mg/g creat (ref 0–29)
Microalbumin, Urine: 12 ug/mL

## 2022-10-04 LAB — COMPREHENSIVE METABOLIC PANEL
ALT: 14 IU/L (ref 0–32)
AST: 20 IU/L (ref 0–40)
Albumin/Globulin Ratio: 1.4 (ref 1.2–2.2)
Albumin: 3.9 g/dL (ref 3.9–4.9)
Alkaline Phosphatase: 91 IU/L (ref 44–121)
BUN/Creatinine Ratio: 18 (ref 12–28)
BUN: 17 mg/dL (ref 8–27)
Bilirubin Total: 0.3 mg/dL (ref 0.0–1.2)
CO2: 25 mmol/L (ref 20–29)
Calcium: 9.5 mg/dL (ref 8.7–10.3)
Chloride: 99 mmol/L (ref 96–106)
Creatinine, Ser: 0.95 mg/dL (ref 0.57–1.00)
Globulin, Total: 2.8 g/dL (ref 1.5–4.5)
Glucose: 142 mg/dL — ABNORMAL HIGH (ref 70–99)
Potassium: 5 mmol/L (ref 3.5–5.2)
Sodium: 140 mmol/L (ref 134–144)
Total Protein: 6.7 g/dL (ref 6.0–8.5)
eGFR: 65 mL/min/{1.73_m2} (ref >59)

## 2022-10-04 LAB — IRON,TIBC AND FERRITIN PANEL
Ferritin: 45 ng/mL (ref 15–150)
Iron Saturation: 14 % — ABNORMAL LOW (ref 15–55)
Iron: 48 ug/dL (ref 27–139)
Total Iron Binding Capacity: 335 ug/dL (ref 250–450)
UIBC: 287 ug/dL (ref 118–369)

## 2022-10-04 LAB — HEMOGLOBIN A1C
Est. average glucose Bld gHb Est-mCnc: 189 mg/dL
Hgb A1c MFr Bld: 8.2 % — ABNORMAL HIGH (ref 4.8–5.6)

## 2022-10-04 LAB — LIPID PANEL
Chol/HDL Ratio: 2.7 ratio (ref 0.0–4.4)
Cholesterol, Total: 147 mg/dL (ref 100–199)
HDL: 54 mg/dL (ref >39)
LDL Chol Calc (NIH): 64 mg/dL (ref 0–99)
Triglycerides: 172 mg/dL — ABNORMAL HIGH (ref 0–149)
VLDL Cholesterol Cal: 29 mg/dL (ref 5–40)

## 2022-10-04 LAB — CARDIOVASCULAR RISK ASSESSMENT

## 2022-10-04 LAB — VITAMIN D 25 HYDROXY (VIT D DEFICIENCY, FRACTURES): Vit D, 25-Hydroxy: 59.4 ng/mL (ref 30.0–100.0)

## 2022-10-05 ENCOUNTER — Other Ambulatory Visit: Payer: Self-pay | Admitting: Physician Assistant

## 2022-10-05 DIAGNOSIS — E1142 Type 2 diabetes mellitus with diabetic polyneuropathy: Secondary | ICD-10-CM

## 2022-10-05 MED ORDER — PIOGLITAZONE HCL 15 MG PO TABS: 15.0000 mg | ORAL_TABLET | Freq: Every day | ORAL | 3 refills | Status: AC

## 2022-10-17 ENCOUNTER — Inpatient Hospital Stay: Admission: RE | Admit: 2022-10-17 | Payer: PPO | Source: Ambulatory Visit

## 2022-10-31 ENCOUNTER — Other Ambulatory Visit: Payer: Self-pay | Admitting: Physician Assistant

## 2022-11-05 ENCOUNTER — Other Ambulatory Visit: Payer: Self-pay | Admitting: Physician Assistant

## 2022-11-05 DIAGNOSIS — M5416 Radiculopathy, lumbar region: Secondary | ICD-10-CM

## 2022-11-05 DIAGNOSIS — F419 Anxiety disorder, unspecified: Secondary | ICD-10-CM

## 2022-11-08 NOTE — Progress Notes (Signed)
This encounter was created in error - please disregard.

## 2022-11-14 ENCOUNTER — Ambulatory Visit
Admission: RE | Admit: 2022-11-14 | Discharge: 2022-11-14 | Disposition: A | Discharge status: Home / Self Care | Payer: PPO | Source: Ambulatory Visit | Attending: Physician Assistant | Admitting: Physician Assistant

## 2022-11-14 DIAGNOSIS — Z1231 Encounter for screening mammogram for malignant neoplasm of breast: Secondary | ICD-10-CM

## 2022-12-07 ENCOUNTER — Telehealth: Payer: Self-pay | Admitting: Family Medicine

## 2022-12-07 NOTE — Telephone Encounter (Signed)
Pt transferred from [facility] to [facility] on 12/06/22. Records request has been sent to the [facility].

## 2022-12-09 ENCOUNTER — Other Ambulatory Visit: Payer: Self-pay | Admitting: Physician Assistant

## 2023-01-07 ENCOUNTER — Ambulatory Visit: Payer: PPO | Admitting: Physician Assistant

## 2023-02-19 ENCOUNTER — Ambulatory Visit: Payer: PPO

## 2023-05-09 ENCOUNTER — Other Ambulatory Visit: Payer: Self-pay | Admitting: Physician Assistant

## 2023-08-30 ENCOUNTER — Other Ambulatory Visit: Payer: Self-pay | Admitting: Physician Assistant

## 2023-08-30 DIAGNOSIS — E782 Mixed hyperlipidemia: Secondary | ICD-10-CM

## 2023-09-26 ENCOUNTER — Other Ambulatory Visit: Payer: Self-pay | Admitting: Physician Assistant

## 2023-09-26 DIAGNOSIS — E782 Mixed hyperlipidemia: Secondary | ICD-10-CM

## 2023-09-26 DIAGNOSIS — F419 Anxiety disorder, unspecified: Secondary | ICD-10-CM

## 2023-09-27 ENCOUNTER — Other Ambulatory Visit: Payer: Self-pay | Admitting: Physician Assistant

## 2023-10-03 ENCOUNTER — Other Ambulatory Visit: Payer: Self-pay | Admitting: Physician Assistant

## 2023-10-03 DIAGNOSIS — E559 Vitamin D deficiency, unspecified: Secondary | ICD-10-CM

## 2023-10-17 ENCOUNTER — Other Ambulatory Visit: Payer: Self-pay | Admitting: Physician Assistant

## 2023-10-17 DIAGNOSIS — E782 Mixed hyperlipidemia: Secondary | ICD-10-CM

## 2023-10-28 ENCOUNTER — Other Ambulatory Visit: Payer: Self-pay | Admitting: Physician Assistant

## 2024-05-28 ENCOUNTER — Other Ambulatory Visit: Payer: Self-pay | Admitting: Physician Assistant
